# Patient Record
Sex: Male | Born: 1950 | Race: White | Hispanic: No | Smoking: Never smoker
Health system: Southern US, Community
[De-identification: ages and names within clinical notes are randomized; demographics above are authoritative.]

## PROBLEM LIST (undated history)

## (undated) DIAGNOSIS — I255 Ischemic cardiomyopathy: Secondary | ICD-10-CM

## (undated) DIAGNOSIS — R55 Syncope and collapse: Secondary | ICD-10-CM

## (undated) DIAGNOSIS — Z87442 Personal history of urinary calculi: Secondary | ICD-10-CM

## (undated) DIAGNOSIS — I2510 Atherosclerotic heart disease of native coronary artery without angina pectoris: Secondary | ICD-10-CM

## (undated) DIAGNOSIS — E119 Type 2 diabetes mellitus without complications: Secondary | ICD-10-CM

## (undated) DIAGNOSIS — N182 Chronic kidney disease, stage 2 (mild): Secondary | ICD-10-CM

## (undated) DIAGNOSIS — I10 Essential (primary) hypertension: Secondary | ICD-10-CM

## (undated) DIAGNOSIS — J189 Pneumonia, unspecified organism: Secondary | ICD-10-CM

## (undated) DIAGNOSIS — R001 Bradycardia, unspecified: Secondary | ICD-10-CM

## (undated) DIAGNOSIS — K219 Gastro-esophageal reflux disease without esophagitis: Secondary | ICD-10-CM

## (undated) HISTORY — DX: Essential (primary) hypertension: I10

## (undated) HISTORY — PX: SHOULDER SURGERY: SHX246

## (undated) HISTORY — PX: NOSE SURGERY: SHX723

## (undated) HISTORY — DX: Type 2 diabetes mellitus without complications: E11.9

## (undated) HISTORY — PX: CATARACT EXTRACTION: SUR2

## (undated) HISTORY — DX: Atherosclerotic heart disease of native coronary artery without angina pectoris: I25.10

## (undated) HISTORY — DX: Gastro-esophageal reflux disease without esophagitis: K21.9

## (undated) HISTORY — DX: Ischemic cardiomyopathy: I25.5

## (undated) HISTORY — DX: Syncope and collapse: R55

## (undated) HISTORY — PX: WRIST FRACTURE SURGERY: SHX121

## (undated) HISTORY — PX: ELBOW SURGERY: SHX618

---

## 2001-07-17 DIAGNOSIS — I219 Acute myocardial infarction, unspecified: Secondary | ICD-10-CM

## 2001-07-17 HISTORY — DX: Acute myocardial infarction, unspecified: I21.9

## 2005-01-05 ENCOUNTER — Encounter: Payer: Self-pay | Admitting: Cardiology

## 2005-01-06 ENCOUNTER — Encounter: Payer: Self-pay | Admitting: Cardiology

## 2007-03-19 ENCOUNTER — Encounter: Payer: Self-pay | Admitting: Cardiology

## 2009-11-27 ENCOUNTER — Encounter: Payer: Self-pay | Admitting: Cardiology

## 2010-01-12 ENCOUNTER — Ambulatory Visit: Payer: Self-pay | Admitting: Cardiology

## 2010-01-12 DIAGNOSIS — I10 Essential (primary) hypertension: Secondary | ICD-10-CM

## 2010-01-12 DIAGNOSIS — R079 Chest pain, unspecified: Secondary | ICD-10-CM

## 2010-01-12 DIAGNOSIS — I252 Old myocardial infarction: Secondary | ICD-10-CM | POA: Insufficient documentation

## 2010-01-12 DIAGNOSIS — K219 Gastro-esophageal reflux disease without esophagitis: Secondary | ICD-10-CM

## 2010-01-12 DIAGNOSIS — E119 Type 2 diabetes mellitus without complications: Secondary | ICD-10-CM

## 2010-01-19 ENCOUNTER — Encounter: Payer: Self-pay | Admitting: Physician Assistant

## 2010-01-19 ENCOUNTER — Ambulatory Visit: Payer: Self-pay | Admitting: Cardiology

## 2010-02-01 ENCOUNTER — Ambulatory Visit: Payer: Self-pay | Admitting: Cardiology

## 2010-02-01 ENCOUNTER — Encounter (INDEPENDENT_AMBULATORY_CARE_PROVIDER_SITE_OTHER): Payer: Self-pay | Admitting: *Deleted

## 2010-02-02 ENCOUNTER — Encounter: Payer: Self-pay | Admitting: Cardiology

## 2010-02-03 ENCOUNTER — Ambulatory Visit: Payer: Self-pay | Admitting: Cardiovascular Disease

## 2010-02-03 ENCOUNTER — Encounter: Payer: Self-pay | Admitting: Cardiology

## 2010-02-03 ENCOUNTER — Observation Stay (HOSPITAL_COMMUNITY): Admission: AD | Admit: 2010-02-03 | Discharge: 2010-02-04 | Payer: Self-pay | Admitting: Cardiovascular Disease

## 2010-02-03 ENCOUNTER — Ambulatory Visit: Payer: Self-pay | Admitting: Cardiology

## 2010-02-03 ENCOUNTER — Inpatient Hospital Stay (HOSPITAL_BASED_OUTPATIENT_CLINIC_OR_DEPARTMENT_OTHER): Admission: RE | Admit: 2010-02-03 | Discharge: 2010-02-03 | Payer: Self-pay | Admitting: Cardiovascular Disease

## 2010-02-04 ENCOUNTER — Encounter (INDEPENDENT_AMBULATORY_CARE_PROVIDER_SITE_OTHER): Payer: Self-pay | Admitting: *Deleted

## 2010-02-04 ENCOUNTER — Encounter: Payer: Self-pay | Admitting: Cardiology

## 2010-02-04 ENCOUNTER — Telehealth (INDEPENDENT_AMBULATORY_CARE_PROVIDER_SITE_OTHER): Payer: Self-pay | Admitting: *Deleted

## 2010-02-07 ENCOUNTER — Telehealth (INDEPENDENT_AMBULATORY_CARE_PROVIDER_SITE_OTHER): Payer: Self-pay | Admitting: *Deleted

## 2010-02-08 ENCOUNTER — Encounter (INDEPENDENT_AMBULATORY_CARE_PROVIDER_SITE_OTHER): Payer: Self-pay | Admitting: *Deleted

## 2010-02-09 ENCOUNTER — Encounter: Payer: Self-pay | Admitting: Cardiology

## 2010-02-10 ENCOUNTER — Encounter: Payer: Self-pay | Admitting: Cardiology

## 2010-02-23 ENCOUNTER — Encounter: Payer: Self-pay | Admitting: Cardiology

## 2010-03-16 ENCOUNTER — Ambulatory Visit: Payer: Self-pay | Admitting: Cardiology

## 2010-08-16 ENCOUNTER — Encounter: Payer: Self-pay | Admitting: Cardiology

## 2010-08-16 NOTE — Assessment & Plan Note (Signed)
Summary: f/u on stress echo  --agh   Visit Type:  Follow-up Primary Provider:  Johny Drilling Park,MD   History of Present Illness: the patient is a 60 year old male with a history of coronary artery disease documented in my prior note. The patient had a prior intervention at [facility]. He had a repeat catheterization in [place] 5 years ago with patent stents.on the last office visit he reports increased shortness of breath and chest pain. We prescribed indoor and ordered an echo and a stress echocardiogram. Unfortunately stress echocardiogram was nondiagnostic and by echo his ejection fraction was 40% with anterior akinesis. The patient states that he had no improvement with nitroglycerin preparations. Had several more episodes of substernal chest pain each time left-sided quite severe in nature with radiation to the arm and not resolved by nitroglycerin. However, he feels it is under significant amount of stress because his wife died suddenly 10 days ago from a massive heart attack. She was a diabetic but had no prior heart disease.  Preventive Screening-Counseling & Management  Alcohol-Tobacco     Smoking Status: never  Current Medications (verified): 1)  Tamsulosin Hcl 0.4 Mg Caps (Tamsulosin Hcl) .... Take 1 Tablet By Mouth Once A Day 2)  Metoprolol Succinate 100 Mg Xr24h-Tab (Metoprolol Succinate) .... Take 1 Tablet By Mouth Once A Day 3)  Ramipril 5 Mg Caps (Ramipril) .... Take 1 Tablet By Mouth Once A Day 4)  Aspir-Trin 325 Mg Tbec (Aspirin) .... Take 1 Tablet By Mouth Once A Day 5)  Pravastatin Sodium 40 Mg Tabs (Pravastatin Sodium) .... Take 1 Tablet By Mouth Once A Day 6)  Omeprazole 20 Mg Cpdr (Omeprazole) .... Take 1 Tablet By Mouth Once A Day 7)  Duetact 30-2 Mg Tabs (Pioglitazone Hcl-Glimepiride) .... Take 1 Tablet By Mouth Once A Day 8)  Imdur 30 Mg Xr24h-Tab (Isosorbide Mononitrate) .... Take 1 Tablet By Mouth Once A Day 9)  Nitrostat 0.4 Mg Subl (Nitroglycerin) .... Dissolve One  Tablet Under Tongue For Severe Chest Pain As Needed Every 5 Minutes, Not To Exceed 3 in 15 Min Time Frame 10)  Plavix 75 Mg Tabs (Clopidogrel Bisulfate) .... Take 1 Tablet By Mouth Once A Day  Allergies: No Known Drug Allergies  Comments:  Nurse/Medical Assistant: The patient's medications were reviewed with the patient and were updated in the Medication List. Pt verbally confirmed medications.  Cyril Loosen, RN, BSN (February 01, 2010 1:02 PM)  Past History:  Past Medical History: Last updated: 01/23/10 MI approx. 8 yrs ago in [facility] [place] Diabetes Type 2 G E R D Hypertension  Past Surgical History: Last updated: 01/23/2010 Stents x 2 Rt. Shoulder Surgery left elbow surgery Tumors nose surgery  Family History: Last updated: 2010/01/23 Father:deceased age 43 stroke,MI Mother: living age 18 healthy Siblings:4 sisters HTN  Social History: Last updated: 02/01/2010 widowed Alcohol Use - no Drug Use - no  Risk Factors: Smoking Status: never (02/01/2010)  Social History: widowed Alcohol Use - no Drug Use - no  Review of Systems       The patient complains of chest pain, shortness of breath, and depression.  The patient denies fatigue, malaise, fever, weight gain/loss, vision loss, decreased hearing, hoarseness, palpitations, prolonged cough, wheezing, sleep apnea, coughing up blood, abdominal pain, blood in stool, nausea, vomiting, diarrhea, heartburn, incontinence, blood in urine, muscle weakness, joint pain, leg swelling, rash, skin lesions, headache, fainting, dizziness, anxiety, enlarged lymph nodes, easy bruising or bleeding, and environmental allergies.    Vital Signs:  Patient profile:  60 year old male Height:      76 inches Weight:      222 pounds BMI:     27.12 Pulse rate:   54 / minute BP sitting:   148 / 80  (left arm) Cuff size:   large  Vitals Entered By: Cyril Loosen, RN, BSN (February 01, 2010 1:00 PM)  Nutrition Counseling: Patient's BMI is  greater than 25 and therefore counseled on weight management options. Comments Follow up on stress echo results    Physical Exam  Additional Exam:  General: Well-developed, well-nourished in no distress head: Normocephalic and atraumatic eyes PERRLA/EOMI intact, conjunctiva and lids normal nose: No deformity or lesions mouth normal dentition, normal posterior pharynx neck: Supple, no JVD.  No masses, thyromegaly or abnormal cervical nodes lungs: Normal breath sounds bilaterally without wheezing.  Normal percussion heart: regular rate and rhythm with normal S1 and S2, no S3 or S4.  PMI is normal.  No pathological murmurs abdomen: Normal bowel sounds, abdomen is soft and nontender without masses, organomegaly or hernias noted.  No hepatosplenomegaly musculoskeletal: Back normal, normal gait muscle strength and tone normal pulsus: Pulse is normal in all 4 extremities Extremities: No peripheral pitting edema neurologic: Alert and oriented x 3 skin: Intact without lesions or rashes cervical nodes: No significant adenopathy psychologic: Normal affect    Impression & Recommendations:  Problem # 1:  CAD (ICD-414.00) the patient is a surprise and the wall myocardial infarction. He has had a prior interventional procedure in the setting of a myocardial infarction. A stress echocardiogram is nondiagnostic although is able to walk 9 minutes without any substernal chest pain. Dr. Myrtis Ser felt images were nondiagnostic his ejection fraction is 40%. However the patient has ongoing chest pain and shortness of breath without any improvement and nitrates. I recommended that we proceed with a diagnostic cardiac catheterization. I discussed the risk and benefits of the procedure and hopefully this can be done via radial approach.I also start patient on some samples of Plavix 75 mg p.o. q. daily. His updated medication list for this problem includes:    Metoprolol Succinate 100 Mg Xr24h-tab (Metoprolol  succinate) .Marland Kitchen... Take 1 tablet by mouth once a day    Ramipril 5 Mg Caps (Ramipril) .Marland Kitchen... Take 1 tablet by mouth once a day    Aspir-trin 325 Mg Tbec (Aspirin) .Marland Kitchen... Take 1 tablet by mouth once a day    Imdur 30 Mg Xr24h-tab (Isosorbide mononitrate) .Marland Kitchen... Take 1 tablet by mouth once a day    Nitrostat 0.4 Mg Subl (Nitroglycerin) .Marland Kitchen... Dissolve one tablet under tongue for severe chest pain as needed every 5 minutes, not to exceed 3 in 15 min time frame    Plavix 75 Mg Tabs (Clopidogrel bisulfate) .Marland Kitchen... Take 1 tablet by mouth once a day  Orders: T-Basic Metabolic Panel [id number]) T-CBC No Diff ([id number]) T-Protime, Auto ([id number]) T-PTT ([id number])  Problem # 2:  HYPERTENSION (ICD-401.9) Assessment: Comment Only  His updated medication list for this problem includes:    Metoprolol Succinate 100 Mg Xr24h-tab (Metoprolol succinate) .Marland Kitchen... Take 1 tablet by mouth once a day    Ramipril 5 Mg Caps (Ramipril) .Marland Kitchen... Take 1 tablet by mouth once a day    Aspir-trin 325 Mg Tbec (Aspirin) .Marland Kitchen... Take 1 tablet by mouth once a day  Orders: T-Basic Metabolic Panel [id number]) T-CBC No Diff ([id number]) T-Protime, Auto ([id number]) T-PTT ([id number])  Other Orders: T-Chest x-ray, 2 views ([id number]) Cardiac Catheterization (Cardiac Cath)  Patient Instructions: 1)  JV Cath -  radial 2)  Plavix 75mg  daily 3)  Follow up in  1 month Prescriptions: PLAVIX 75 MG TABS (CLOPIDOGREL BISULFATE) Take 1 tablet by mouth once a day  #30 x 6   Entered by:   Hoover Brunette, LPN   Authorized by:   Lewayne Bunting, MD, [facility]   Signed by:   Hoover Brunette, LPN on 16/04/9603   Method used:   [facility]  [address] [phone][facility]       [address]       [place], [place]  [address]       Ph: [phone]       Fax: [phone]   RxID:   [id number]   Handout requested.

## 2010-08-16 NOTE — Assessment & Plan Note (Signed)
Summary: NP-HISTORY OF MI & LEFT ARM PAIN   Visit Type:  Initial Consult Primary Provider:  chan Park,MD  CC:  chest pain.  History of Present Illness: the patient is a pleasant 60 year old male with history of coronary artery disease. The patient had a prior myocardial infarction at [facility]. He apparently had 2 stents placed which according to him were medicated stents. Also a catheterization 5 years ago for recurrent left arm pain in [place] and this was within normal limits. We do not have those records available.  The patient has multiple risk factors for coronary artery disease including hypertension dyslipidemia and diabetes mellitus.  Over the last several months the patient has noticed increased shortness of breath as well as some tightness in the left arm particularly in the lower aspect of the upper with radiation into the hand. Typically this gets worse with exertion. She denies however any resting substernal chest pain or dyspnea at rest. The patient states that his prior presentation myocardial infarction was also with left arm pain but no substernal chest pain. His EKG in the office shows an old anterior wall myocardial infarction with nonspecific ST-T wave changes.  He reports to me that his most recent total cholesterol is 134 mg percent. His hemoglobin has been running slightly high but overall his diabetes has been in reasonable control again according to the patient's history.  Preventive Screening-Counseling & Management  Alcohol-Tobacco     Smoking Status: never  Current Medications (verified): 1)  Tamsulosin Hcl 0.4 Mg Caps (Tamsulosin Hcl) .... Take 1 Tablet By Mouth Once A Day 2)  Metoprolol Succinate 100 Mg Xr24h-Tab (Metoprolol Succinate) .... Take 1 Tablet By Mouth Once A Day 3)  Ramipril 5 Mg Caps (Ramipril) .... Take 1 Tablet By Mouth Once A Day 4)  Aspir-Trin 325 Mg Tbec (Aspirin) .... Take 1 Tablet By Mouth Once A Day 5)   Pravastatin Sodium 40 Mg Tabs (Pravastatin Sodium) .... Take 1 Tablet By Mouth Once A Day 6)  Omeprazole 20 Mg Cpdr (Omeprazole) .... Take 1 Tablet By Mouth Once A Day 7)  Duetact 30-2 Mg Tabs (Pioglitazone Hcl-Glimepiride) .... Take 1 Tablet By Mouth Once A Day 8)  Imdur 30 Mg Xr24h-Tab (Isosorbide Mononitrate) .... Take 1 Tablet By Mouth Once A Day 9)  Nitrostat 0.4 Mg Subl (Nitroglycerin) .... Dissolve One Tablet Under Tongue For Severe Chest Pain As Needed Every 5 Minutes, Not To Exceed 3 in 15 Min Time Frame  Allergies (verified): No Known Drug Allergies  Comments:  Nurse/Medical Assistant: The patient's medication bottles and allergies were reviewed with the patient and were updated in the Medication and Allergy Lists.  Past History:  Past Medical History: Last updated: 02/03/10 MI approx. 8 yrs ago in [place] [place] Diabetes Type 2 G E R D Hypertension  Past Surgical History: Last updated: 02/03/10 Stents x 2 Rt. Shoulder Surgery left elbow surgery Tumors nose surgery  Family History: Last updated: 2010-02-03 Father:deceased age 61 stroke,MI Mother: living age 58 healthy Siblings:4 sisters HTN  Social History: Last updated: Feb 03, 2010 Married  Alcohol Use - no Drug Use - no  Risk Factors: Smoking Status: never (02/03/10)  Family History: Father:deceased age 26 stroke,MI Mother: living age 61 healthy Siblings:4 sisters HTN  Social History: Smoking Status:  never  Review of Systems       The patient complains of shortness of breath.  The patient denies fatigue, malaise, fever, weight gain/loss, vision loss, decreased hearing, hoarseness, chest pain, palpitations, prolonged  cough, wheezing, sleep apnea, coughing up blood, abdominal pain, blood in stool, nausea, vomiting, diarrhea, heartburn, incontinence, blood in urine, muscle weakness, joint pain, leg swelling, rash, skin lesions, headache, fainting, dizziness, depression, anxiety, enlarged lymph  nodes, easy bruising or bleeding, and environmental allergies.    Vital Signs:  Patient profile:   60 year old male Height:      76 inches Weight:      220 pounds BMI:     26.88 Pulse rate:   55 / minute BP sitting:   132 / 81  (left arm) Cuff size:   large  Vitals Entered By: Carlye Grippe (January 12, 2010 2:52 PM)  Nutrition Counseling: Patient's BMI is greater than 25 and therefore counseled on weight management options. CC: chest pain    Physical Exam  Additional Exam:  General: Well-developed, well-nourished in no distress head: Normocephalic and atraumatic eyes PERRLA/EOMI intact, conjunctiva and lids normal nose: No deformity or lesions mouth normal dentition, normal posterior pharynx neck: Supple, no JVD.  No masses, thyromegaly or abnormal cervical nodes lungs: Normal breath sounds bilaterally without wheezing.  Normal percussion heart: regular rate and rhythm with normal S1 and S2, no S3 or S4.  PMI is normal.  No pathological murmurs abdomen: Normal bowel sounds, abdomen is soft and nontender without masses, organomegaly or hernias noted.  No hepatosplenomegaly musculoskeletal: Back normal, normal gait muscle strength and tone normal pulsus: Pulse is normal in all 4 extremities Extremities: No peripheral pitting edema neurologic: Alert and oriented x 3 skin: Intact without lesions or rashes cervical nodes: No significant adenopathy psychologic: Normal affect    EKG  Procedure date:  01/12/2010  Findings:      Sinus bradycardia. Old anteroseptal infarct pattern. Nonspecific ST-T wave changes.  Impression & Recommendations:  Problem # 1:  CHEST PAIN (ICD-786.50) the patient doesn't have any chest pain but he does have left arm pain similar to his prior presentation of myocardial infarction. The patient's pattern does not appear to be particularly unstable. We will add Imdur to his medical regimen. I also given p.r.n. nitroglycerin. He reports that his  episodes do not last longer than 30 seconds approximately 1 minute. Therefore I decided to first proceed with a dobutamine echocardiographic study and a baseline echo prior to consideration of catheterization. It may be that the patient may respond to medical therapy. His updated medication list for this problem includes:    Metoprolol Succinate 100 Mg Xr24h-tab (Metoprolol succinate) .Marland Kitchen... Take 1 tablet by mouth once a day    Ramipril 5 Mg Caps (Ramipril) .Marland Kitchen... Take 1 tablet by mouth once a day    Aspir-trin 325 Mg Tbec (Aspirin) .Marland Kitchen... Take 1 tablet by mouth once a day    Imdur 30 Mg Xr24h-tab (Isosorbide mononitrate) .Marland Kitchen... Take 1 tablet by mouth once a day    Nitrostat 0.4 Mg Subl (Nitroglycerin) .Marland Kitchen... Dissolve one tablet under tongue for severe chest pain as needed every 5 minutes, not to exceed 3 in 15 min time frame  Orders: EKG w/ Interpretation (93000) 2-D Echocardiogram (2D Echo) Echo- Stress (Stress Echo)  Problem # 2:  HYPERTENSION (ICD-401.9) blood pressure is well controlled. I made no changes in his antihypertensive regimen. His updated medication list for this problem includes:    Metoprolol Succinate 100 Mg Xr24h-tab (Metoprolol succinate) .Marland Kitchen... Take 1 tablet by mouth once a day    Ramipril 5 Mg Caps (Ramipril) .Marland Kitchen... Take 1 tablet by mouth once a day    Aspir-trin 325 Mg  Tbec (Aspirin) .Marland Kitchen... Take 1 tablet by mouth once a day  Problem # 3:  DM (ICD-250.00) followup primary care physician. His updated medication list for this problem includes:    Ramipril 5 Mg Caps (Ramipril) .Marland Kitchen... Take 1 tablet by mouth once a day    Aspir-trin 325 Mg Tbec (Aspirin) .Marland Kitchen... Take 1 tablet by mouth once a day    Duetact 30-2 Mg Tabs (Pioglitazone hcl-glimepiride) .Marland Kitchen... Take 1 tablet by mouth once a day  Problem # 4:  OLD MYOCARDIAL INFARCTION (ICD-412) Assessment: Comment Only  His updated medication list for this problem includes:    Metoprolol Succinate 100 Mg Xr24h-tab (Metoprolol  succinate) .Marland Kitchen... Take 1 tablet by mouth once a day    Ramipril 5 Mg Caps (Ramipril) .Marland Kitchen... Take 1 tablet by mouth once a day    Aspir-trin 325 Mg Tbec (Aspirin) .Marland Kitchen... Take 1 tablet by mouth once a day    Imdur 30 Mg Xr24h-tab (Isosorbide mononitrate) .Marland Kitchen... Take 1 tablet by mouth once a day    Nitrostat 0.4 Mg Subl (Nitroglycerin) .Marland Kitchen... Dissolve one tablet under tongue for severe chest pain as needed every 5 minutes, not to exceed 3 in 15 min time frame  Patient Instructions: 1)  Baseline Echo 2)  Stress Echo - next week (in the pm if possible because of work) 3)  Imdur 30mg  daily 4)  Nitroglycerin as needed for severe chest pain  5)  Follow up in  3 months Prescriptions: NITROSTAT 0.4 MG SUBL (NITROGLYCERIN) dissolve one tablet under tongue for severe chest pain as needed every 5 minutes, not to exceed 3 in 15 min time frame  #25 x 3   Entered by:   Hoover Brunette, LPN   Authorized by:   Lewayne Bunting, MD, [facility]   Signed by:   Hoover Brunette, LPN on 04/54/0981   Method used:   [facility]        [facility]  [address] [phone][facility] (retail)       [address]       [place], [place]  [address]       Ph: [phone]       Fax: [phone]   RxID:   [id number] IMDUR 30 MG XR24H-TAB (ISOSORBIDE MONONITRATE) Take 1 tablet by mouth once a day  #30 x 6   Entered by:   Hoover Brunette, LPN   Authorized by:   Lewayne Bunting, MD, [facility]   Signed by:   Hoover Brunette, LPN on 40/34/7425   Method used:   Electron[facility]        [facility]  [address] [phone]* (retail)       [address]       [place], [place]  [address]       Ph: [phone]       Fax: [phone]   RxID:   [id number]   Handout requested.  I have personnaly reviewed all medications changes and approved new prescriptions and refills. Lewayne Bunting, MD, [facility]  January 12, 2010 3:27 PM

## 2010-08-16 NOTE — Letter (Signed)
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 [address]   [place], [place] [address]   Phone: [phone]  Fax: [phone]    02/08/2010  TO: [name] IT MAY CONCERN   RE: Steven Haas [address] [id number]   The above named individual is under my medical care and may return to work on:  Monday, February 21, 2010.  If you have any further questions or need additional information, please call.     Sincerely,    Hoover Brunette, LPN

## 2010-08-16 NOTE — Medication Information (Signed)
Summary: RX Folder/ [facility]PON[facility]  RX Folder/ [facility]   Imported By: Dorise Hiss 02/07/2010 14:51:33  _____________________________________________________________________  External Attachment:    Type:   Image     Comment:   External Document

## 2010-08-16 NOTE — Medication Information (Signed)
Summary: RX Folder/ FAXED SECOND REQUEST [facility]  RX Folder/ FAXED SECOND REQUEST [facility]   Imported By: Dorise Hiss 02/09/2010 14:32:17  _____________________________________________________________________  External Attachment:    Type:   Image     Comment:   External Document

## 2010-08-16 NOTE — Letter (Signed)
Summary: External Correspondence/ FAXED PRE-CATH ORDER  External Correspondence/ FAXED PRE-CATH ORDER   Imported By: Dorise Hiss 02/08/2010 12:16:55  _____________________________________________________________________  External Attachment:    Type:   Image     Comment:   External Document

## 2010-08-16 NOTE — Letter (Signed)
Summary: Cardiac Cath Instructions - [facility]  [facility] [address]   [place], [place] [address]   Phone: [phone]  Fax: [phone]     02/01/2010 MRN: [id number]  Steven Haas [address] [place], [place]  [address]  Dear [name],   You are scheduled for a Cardiac Catheterization on Thursday, July 21 with Dr. Sanjuana Kava at 12:30.  Please arrive to the 1st floor of the Heart and Vascular Center at [facility] at 11:30 am on the day of your procedure. Please do not arrive before 6:30 a.m. Call the Heart and Vascular Center at [phone] if you are unable to make your appointment. The Code to get into the parking garage under the building is 1000. Take the elevators to the 1st floor. You must have someone to drive you home. Someone must be with you for the first 24 hours after you arrive home. Please wear clothes that are easy to get on and off and wear slip-on shoes. Do not eat or drink after midnight except water with your medications that morning. Bring all your medications and current insurance cards with you.  _X__ DO NOT take these medications before your procedure: Duetact - morning of procedure.  _X__ Make sure you take your aspirin.  _X___ Bonita Quin may take ALL of your medications with water that morning except listed above.  ___ DO NOT take ANY medications before your procedure.  ___ Pre-med instructions:  ________________________________________________________________________  The usual length of stay after your procedure is 2 to 3 hours. This can vary.  If you have any questions, please call the office at the number listed above.  Hoover Brunette, LPN               Directions to the [facility]  Please Note : Park in [place] under the building not the parking deck.  From [employer]: Turn onto [address] Left onto [address] (1st stoplight) Right at the  brick entrance to the hospital ([address]) Bear to the right and you will see a blue sign "Heart and Vascular Center" Parking garage is a sharp right'to get through the gate out in the code _______. Once you park, take the elevator to the first floor. Please do not arrive before 0630am. The building will be dark before that time.   From [address] Turn onto [address] Turn left into the brick entrance to the hospital (Main circle drive) Bear to the right and you will see a blue sign "[facility] Center" Parking garage is a sharp right, to get thru the gate put in the code ____. Once you park, take the elevator to the first floor. Please do not arrive before 0630am. The building will be dark before that time

## 2010-08-16 NOTE — Progress Notes (Signed)
Summary: med interaction   Phone Note Other Incoming   Summary of Call: [facility] regarding pt. being Plavix and Omeprazole together.  Did you want to suggest different reflux med? Hoover Brunette, LPN  February 04, 2010 1:55 PM   Follow-up for Phone Call        There is no real interaction but OK to take Zantac 150 mg Po at bedtime.  Follow-up by: Lewayne Bunting, MD, [facility],  February 07, 2010 11:55 AM  Additional Follow-up for Phone Call Additional follow up Details #1::        Patient notified.   Will continue with the Omeprazole. Hoover Brunette, LPN  February 07, 2010 12:13 PM

## 2010-08-16 NOTE — Progress Notes (Signed)
Summary: when go back to work  Phone Note Call from Patient   Complaint: Abdominal Pain Summary of Call: Needs to know when he can drive and go back to work.  Was d/c on 7/22 after stent placement and not told at d/c when able to go back to work.  Read d/c note, but was not clear to me as to when he could go back.  States he feels good, just a little weak.  States he feels like he could go back first of next week if okay by MD.   Hoover Brunette, LPN  February 07, 2010 12:17 PM   Follow-up for Phone Call        Return to work in two weeks.  Follow-up by: Lewayne Bunting, MD, [facility],  February 07, 2010 2:36 PM  Additional Follow-up for Phone Call Additional follow up Details #1::        Patient notified.   Hoover Brunette, LPN  February 08, 2010 10:30 AM

## 2010-08-16 NOTE — Medication Information (Signed)
Summary: RX Folder/ PLAV[facility] MEETS CLINICAL CRITERIA [facility]  RX Folder/ [facility] MEETS CLINICAL CRITERIA [facility]   Imported By: Dorise Hiss 02/10/2010 13:34:47  _____________________________________________________________________  External Attachment:    Type:   Image     Comment:   External Document

## 2010-08-16 NOTE — Letter (Signed)
Summary: Internal Other/ PATIENT HISTORY FORM  Internal Other/ PATIENT HISTORY FORM   Imported By: Dorise Hiss 01/14/2010 16:09:13  _____________________________________________________________________  External Attachment:    Type:   Image     Comment:   External Document

## 2010-08-16 NOTE — Letter (Signed)
Summary: Engineer, materials at [facility]  [address]   [place], [place] [address]   Phone: [phone]  Fax: [phone]        February 04, 2010 MRN: [id number]   Steven Haas [address] [place], [place]  [address]   Dear [name],  Your test ordered by Selena Batten has been reviewed by your physician (or physician assistant) and was found to be normal or stable. Your physician (or physician assistant) felt no changes were needed at this time.  ____ Echocardiogram  ____ Cardiac Stress Test  __X__ Lab Work  ____ Peripheral vascular study of arms, legs or neck  __X__ CT scan or X-ray  ____ Lung or Breathing test  _X___ Other:  above test were pre-cath   Thank you.   Hoover Brunette, LPN    Duane Boston, M.D., F.A.C.C. Thressa Sheller, M.D., F.A.C.C. Oneal Grout, M.D., F.A.C.C. Cheree Ditto, M.D., F.A.C.C. Daiva Nakayama, M.D., F.A.C.C. Kenney Houseman, M.D., F.A.C.C. Jeanne Ivan, PA-C

## 2010-08-16 NOTE — Assessment & Plan Note (Signed)
Summary: f32m  --agh   Visit Type:  Follow-up Primary Provider:  Johny Drilling Park,MD   History of Present Illness: the patient is a decidual male with history of coronary artery disease and underwent recent drug-eluting stent placement to the LAD secondary to in-stent restenosis. The patient's chest pain is now improved. He has been placed on Plavix.P2 Y. 12 testing was done the catheterization laboratory and show Plavix responsiveness. 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 The patient is recently no significant amount of stress. However since Physician is at more energy. He denies any orthopnea PND. His ejection fraction is roughly 40%. He denies any palpitations presyncope or syncope.  Preventive Screening-Counseling & Management  Alcohol-Tobacco     Smoking Status: never  Current Medications (verified): 1)  Tamsulosin Hcl 0.4 Mg Caps (Tamsulosin Hcl) .... Take 1 Tablet By Mouth Once A Day 2)  Metoprolol Succinate 50 Mg Xr24h-Tab (Metoprolol Succinate) .... Take 1 Tablet By Mouth Once A Day 3)  Ramipril 5 Mg Caps (Ramipril) .... Take 1 Tablet By Mouth Once A Day 4)  Aspir-Trin 325 Mg Tbec (Aspirin) .... Take 1 Tablet By Mouth Once A Day 5)  Pravastatin Sodium 40 Mg Tabs (Pravastatin Sodium) .... Take 1 Tablet By Mouth Once A Day 6)  Omeprazole 20 Mg Cpdr (Omeprazole) .... Take 1 Tablet By Mouth Once A Day 7)  Duetact 30-2 Mg Tabs (Pioglitazone Hcl-Glimepiride) .... Take 1 Tablet By Mouth Once A Day 8)  Imdur 30 Mg Xr24h-Tab (Isosorbide Mononitrate) .... Take 1 Tablet By Mouth Once A Day 9)  Nitrostat 0.4 Mg Subl (Nitroglycerin) .... Dissolve One Tablet Under Tongue For Severe Chest Pain As Needed Every 5 Minutes, Not To Exceed 3 in 15 Min Time Frame 10)  Plavix 75 Mg Tabs (Clopidogrel Bisulfate) .... Take 1 Tablet By Mouth Once A Day  Allergies (verified): No Known Drug Allergies  Comments:  Nurse/Medical Assistant: The  patient's medication bottles and allergies were reviewed with the patient and were updated in the Medication and Allergy Lists.  Past History:  Past Medical History: Last updated: 29-Jan-2010 MI approx. 8 yrs ago in [place] Diabetes Type 2 G E R D Hypertension  Past Surgical History: Last updated: 01/29/2010 Stents x 2 Rt. Shoulder Surgery left elbow surgery Tumors nose surgery  Family History: Last updated: January 29, 2010 Father:deceased age 48 stroke,MI Mother: living age 67 healthy Siblings:4 sisters HTN  Social History: Last updated: 02/01/2010 widowed Alcohol Use - no Drug Use - no  Risk Factors: Smoking Status: never (03/16/2010)  Review of Systems       The patient complains of anxiety.  The patient denies fatigue, malaise, fever, weight gain/loss, vision loss, decreased hearing, hoarseness, chest pain, palpitations, shortness of breath, prolonged cough, wheezing, sleep apnea, coughing up blood, abdominal pain, blood in stool, nausea, vomiting, diarrhea, heartburn, incontinence, blood in urine, muscle weakness, joint pain, leg swelling, rash, skin lesions, headache, fainting, dizziness, depression, enlarged lymph nodes, easy bruising or bleeding, and environmental allergies.    Vital Signs:  Patient profile:   60 year old male Height:      76 inches Weight:      221 pounds Pulse rate:   56 / minute BP sitting:   147 / 80  (left arm) Cuff size:   large  Vitals Entered By: Carlye Grippe (March 16, 2010 1:08 PM)  Physical Exam  Additional Exam:  General: Well-developed, well-nourished in no distress head: Normocephalic and atraumatic  eyes PERRLA/EOMI intact, conjunctiva and lids normal nose: No deformity or lesions mouth normal dentition, normal posterior pharynx neck: Supple, no JVD.  No masses, thyromegaly or abnormal cervical nodes lungs: Normal breath sounds bilaterally without wheezing.  Normal percussion heart: regular rate and rhythm with normal  S1 and S2, no S3 or S4.  PMI is normal.  No pathological murmurs abdomen: Normal bowel sounds, abdomen is soft and nontender without masses, organomegaly or hernias noted.  No hepatosplenomegaly musculoskeletal: Back normal, normal gait muscle strength and tone normal pulsus: Pulse is normal in all 4 extremities Extremities: No peripheral pitting edema neurologic: Alert and oriented x 3 skin: Intact without lesions or rashes cervical nodes: No significant adenopathy psychologic: Normal affect    Impression & Recommendations:  Problem # 1:  CAD (ICD-414.00) the patient status post instent restenosis requiring a drug-eluting stent. He will take indefinitely on aspirin and Plavix. His updated medication list for this problem includes:    Metoprolol Succinate 50 Mg Xr24h-tab (Metoprolol succinate) .Marland Kitchen... Take 1 tablet by mouth once a day    Ramipril 5 Mg Caps (Ramipril) .Marland Kitchen... Take 1 tablet by mouth once a day    Aspir-trin 325 Mg Tbec (Aspirin) .Marland Kitchen... Take 1 tablet by mouth once a day    Imdur 30 Mg Xr24h-tab (Isosorbide mononitrate) .Marland Kitchen... Take 1 tablet by mouth once a day    Nitrostat 0.4 Mg Subl (Nitroglycerin) .Marland Kitchen... Dissolve one tablet under tongue for severe chest pain as needed every 5 minutes, not to exceed 3 in 15 min time frame    Plavix 75 Mg Tabs (Clopidogrel bisulfate) .Marland Kitchen... Take 1 tablet by mouth once a day  Problem # 2:  HYPERTENSION (ICD-401.9) blood pressure is  well-controlled. Continue current medical therapy His updated medication list for this problem includes:    Metoprolol Succinate 50 Mg Xr24h-tab (Metoprolol succinate) .Marland Kitchen... Take 1 tablet by mouth once a day    Ramipril 5 Mg Caps (Ramipril) .Marland Kitchen... Take 1 tablet by mouth once a day    Aspir-trin 325 Mg Tbec (Aspirin) .Marland Kitchen... Take 1 tablet by mouth once a day  Problem # 3:  DM (ICD-250.00) Assessment: Comment Only  His updated medication list for this problem includes:    Ramipril 5 Mg Caps (Ramipril) .Marland Kitchen... Take 1  tablet by mouth once a day    Aspir-trin 325 Mg Tbec (Aspirin) .Marland Kitchen... Take 1 tablet by mouth once a day    Duetact 30-2 Mg Tabs (Pioglitazone hcl-glimepiride) .Marland Kitchen... Take 1 tablet by mouth once a day  Patient Instructions: 1)  Your physician recommends that you continue on your current medications as directed. Please refer to the Current Medication list given to you today. 2)  Follow up in  6 months Prescriptions: IMDUR 30 MG XR24H-TAB (ISOSORBIDE MONONITRATE) Take 1 tablet by mouth once a day  #90 x 3   Entered by:   Hoover Brunette, LPN   Authorized by:   Lewayne Bunting, MD, [facility]   Signed by:   Hoover Brunette, LPN on 59/56/3875   Method used:   Faxed to ...       [facility]       [address] [place], [place]  [address]       Ph: [phone]       Fax: [phone]   RxID:   [id number]

## 2010-08-16 NOTE — Letter (Signed)
Summary: Steven Haas -OFFICE NOTE  Steven Haas -OFFICE NOTE   Imported By: Claudette Laws 12/02/2009 13:56:08  _____________________________________________________________________  External Attachment:    Type:   Image     Comment:   External Document

## 2010-08-16 NOTE — Medication Information (Signed)
Summary: [facility]  [facility]   Imported By: Dorise Hiss 02/07/2010 14:57:06  _____________________________________________________________________  External Attachment:    Type:   Image     Comment:   External Document

## 2010-08-17 ENCOUNTER — Encounter: Payer: Self-pay | Admitting: Cardiology

## 2010-08-17 DIAGNOSIS — I2510 Atherosclerotic heart disease of native coronary artery without angina pectoris: Secondary | ICD-10-CM

## 2010-08-17 DIAGNOSIS — R072 Precordial pain: Secondary | ICD-10-CM

## 2010-08-18 ENCOUNTER — Encounter: Payer: Self-pay | Admitting: Cardiology

## 2010-08-18 DIAGNOSIS — R079 Chest pain, unspecified: Secondary | ICD-10-CM

## 2010-09-06 ENCOUNTER — Ambulatory Visit (INDEPENDENT_AMBULATORY_CARE_PROVIDER_SITE_OTHER): Payer: 59 | Admitting: Cardiology

## 2010-09-06 ENCOUNTER — Encounter: Payer: Self-pay | Admitting: Cardiology

## 2010-09-06 ENCOUNTER — Ambulatory Visit: Payer: Self-pay | Admitting: Cardiology

## 2010-09-06 DIAGNOSIS — I2510 Atherosclerotic heart disease of native coronary artery without angina pectoris: Secondary | ICD-10-CM

## 2010-09-06 DIAGNOSIS — I5022 Chronic systolic (congestive) heart failure: Secondary | ICD-10-CM

## 2010-09-13 NOTE — Letter (Signed)
Summary: Discharge [facility]  Discharge [facility]   Imported By: Dorise Hiss 09/06/2010 11:40:08  _____________________________________________________________________  External Attachment:    Type:   Image     Comment:   External Document

## 2010-09-13 NOTE — Consult Note (Signed)
Summary: Cardiology [facility]  Cardiology Consult-[facility]   Imported By: Cyril Loosen, RN, BSN 09/06/2010 10:58:00  _____________________________________________________________________  External Attachment:    Type:   Image     Comment:   External Document

## 2010-09-13 NOTE — Medication Information (Signed)
Summary: [facility] D/C MEDICATION SHEET ORDER  [facility] D/C MEDICATION SHEET ORDER   Imported By: Zachary George 09/06/2010 10:57:32  _____________________________________________________________________  External Attachment:    Type:   Image     Comment:   External Document

## 2010-10-01 LAB — GLUCOSE, CAPILLARY
Glucose-Capillary: 121 mg/dL — ABNORMAL HIGH (ref 70–99)
Glucose-Capillary: 94 mg/dL (ref 70–99)

## 2010-10-01 LAB — PLATELET INHIBITION P2Y12
Platelet Function  P2Y12: 225 [PRU] (ref 194–418)
Platelet Function Baseline: 321 [PRU] (ref 194–418)

## 2010-10-01 LAB — BASIC METABOLIC PANEL
Chloride: 103 mEq/L (ref 96–112)
GFR calc Af Amer: >60 mL/min (ref >60)
GFR calc non Af Amer: >60 mL/min (ref >60)
Glucose, Bld: 152 mg/dL — ABNORMAL HIGH (ref 70–99)
Sodium: 137 mEq/L (ref 135–145)

## 2010-10-01 LAB — POCT I-STAT GLUCOSE
Glucose, Bld: 117 mg/dL — ABNORMAL HIGH (ref 70–99)
Operator id: 221371

## 2010-10-01 LAB — CBC
HCT: 42.7 % (ref 39.0–52.0)
Platelets: 146 10*3/uL — ABNORMAL LOW (ref 150–400)
RDW: 13.3 % (ref 11.5–15.5)
WBC: 6 10*3/uL (ref 4.0–10.5)

## 2010-10-04 NOTE — Assessment & Plan Note (Signed)
Summary: 6 month fu recv reminder vts [facility] dsch 2/2 -srs   Visit Type:  Follow-up Primary Provider:  Johny Drilling Park,MD   History of Present Illness: The patient was recently hospitalized.  He presented with substernal chest pain.  He ruled out by enzymes.  The patient is status post drug-eluting stent to the LAD in July of 2011 secondary to in-stent restenosis..  Ejection fraction is 50%.  He has chronic sinus bradycardia.  His diabetes mellitus and hypertension. Prior to hospital discharge the patient had a Cardiolite study done which showed a large anteroapical scar with ejection fraction of 40% but no definite ischemia.  The patient had no EKG changes during stress testing. Remote anterior wall MI with stent of the LAD in [place], drug-eluting stent to the LAD secondary to severe in-stent restenosis July 2001.  Prior echocardiogram anteroapical hypokinesis ejection fraction 40% by echo and 50% by catheterization. The patient has done well after his recent hospitalization.  He reports no recurrent chest pain shortness of breath orthopnea PND he has no exertional symptoms.  He also has no heart failure symptoms.   Preventive Screening-Counseling & Management  Alcohol-Tobacco     Smoking Status: never  Current Medications (verified): 1)  Tamsulosin Hcl 0.4 Mg Caps (Tamsulosin Hcl) .... Take 1 Tablet By Mouth Once A Day 2)  Metoprolol Succinate 50 Mg Xr24h-Tab (Metoprolol Succinate) .... Take 1 Tablet By Mouth Once A Day 3)  Ramipril 5 Mg Caps (Ramipril) .... Take 1 Tablet By Mouth Once A Day 4)  Aspir-Trin 325 Mg Tbec (Aspirin) .... Take 1 Tablet By Mouth Once A Day 5)  Pravastatin Sodium 40 Mg Tabs (Pravastatin Sodium) .... Take 1 Tablet By Mouth Once A Day 6)  Omeprazole 20 Mg Cpdr (Omeprazole) .... Take 1 Tablet By Mouth Once A Day 7)  Duetact 30-2 Mg Tabs (Pioglitazone Hcl-Glimepiride) .... Take 1 Tablet By Mouth Once A Day 8)  Imdur 30 Mg Xr24h-Tab (Isosorbide Mononitrate) .... Take 1  Tablet By Mouth Once A Day 9)  Nitrostat 0.4 Mg Subl (Nitroglycerin) .... Dissolve One Tablet Under Tongue For Severe Chest Pain As Needed Every 5 Minutes, Not To Exceed 3 in 15 Min Time Frame 10)  Plavix 75 Mg Tabs (Clopidogrel Bisulfate) .... Take 1 Tablet By Mouth Once A Day  Allergies (verified): No Known Drug Allergies  Comments:  Nurse/Medical Assistant: The patient's medications and allergies were reviewed with the patient and were updated in the Medication and Allergy Lists. Tammi Romine CMA (September 06, 2010 12:57 PM)  Past History:  Past Medical History: Last updated: 01/27/10 MI approx. 8 yrs ago in [place] Diabetes Type 2 G E R D Hypertension  Past Surgical History: Last updated: 2010-01-27 Stents x 2 Rt. Shoulder Surgery left elbow surgery Tumors nose surgery  Family History: Last updated: 2010/01/27 Father:deceased age 56 stroke,MI Mother: living age 23 healthy Siblings:4 sisters HTN  Social History: Last updated: 02/01/2010 widowed Alcohol Use - no Drug Use - no  Risk Factors: Smoking Status: never (09/06/2010)  Vital Signs:  Patient profile:   60 year old male Height:      76 inches Weight:      232 pounds BMI:     28.34 O2 Sat:      96 % on Room air Pulse rate:   54 / minute BP sitting:   147 / 84  (left arm) Cuff size:   large  Vitals Entered By: Fuller Plan CMA (September 06, 2010 12:58 PM)  O2 Flow:  Room air  Physical Exam  Additional Exam:  General: Well-developed, well-nourished in no distress head: Normocephalic and atraumatic eyes PERRLA/EOMI intact, conjunctiva and lids normal nose: No deformity or lesions mouth normal dentition, normal posterior pharynx neck: Supple, no JVD.  No masses, thyromegaly or abnormal cervical nodes lungs: Normal breath sounds bilaterally without wheezing.  Normal percussion heart: regular rate and rhythm with normal S1 and S2, no S3 or S4.  PMI is normal.  No pathological  murmurs abdomen: Normal bowel sounds, abdomen is soft and nontender without masses, organomegaly or hernias noted.  No hepatosplenomegaly musculoskeletal: Back normal, normal gait muscle strength and tone normal pulsus: Pulse is normal in all 4 extremities Extremities: No peripheral pitting edema neurologic: Alert and oriented x 3 skin: Intact without lesions or rashes cervical nodes: No significant adenopathy psychologic: Normal affect    Impression & Recommendations:  Problem # 1:  CAD (ICD-414.00) coronary artery disease increase isosorbide mononitrate to 60 mg p.o. daily  substernal chest pain LV dysfunction PTCA  His updated medication list for this problem includes:    Metoprolol Succinate 50 Mg Xr24h-tab (Metoprolol succinate) .Marland Kitchen... Take 1 tablet by mouth once a day    Ramipril 10 Mg Caps (Ramipril) .Marland Kitchen... Take one capsule by mouth daily    Aspir-trin 325 Mg Tbec (Aspirin) .Marland Kitchen... Take 1 tablet by mouth once a day    Isosorbide Mononitrate Cr 60 Mg Xr24h-tab (Isosorbide mononitrate) .Marland Kitchen... Take one tablet by mouth daily    Nitrostat 0.4 Mg Subl (Nitroglycerin) .Marland Kitchen... Dissolve one tablet under tongue for severe chest pain as needed every 5 minutes, not to exceed 3 in 15 min time frame    Plavix 75 Mg Tabs (Clopidogrel bisulfate) .Marland Kitchen... Take 1 tablet by mouth once a day  Problem # 2:  HYPERTENSION (ICD-401.9) hypertension increase ramipril 10 mg p.o. daily His updated medication list for this problem includes:    Metoprolol Succinate 50 Mg Xr24h-tab (Metoprolol succinate) .Marland Kitchen... Take 1 tablet by mouth once a day    Ramipril 10 Mg Caps (Ramipril) .Marland Kitchen... Take one capsule by mouth daily    Aspir-trin 325 Mg Tbec (Aspirin) .Marland Kitchen... Take 1 tablet by mouth once a day  Problem # 3:  DM (ICD-250.00) Assessment: Comment Only  His updated medication list for this problem includes:    Ramipril 10 Mg Caps (Ramipril) .Marland Kitchen... Take one capsule by mouth daily    Aspir-trin 325 Mg Tbec (Aspirin) .Marland Kitchen...  Take 1 tablet by mouth once a day    Duetact 30-2 Mg Tabs (Pioglitazone hcl-glimepiride) .Marland Kitchen... Take 1 tablet by mouth once a day  Patient Instructions: 1)  Your physician wants you to follow-up in: 6 months. You will receive a reminder letter in the mail one-two months in advance. If you don't receive a letter, please call our office to schedule the follow-up appointment. 2)  Increase Altace (ramipril) to 10mg  by mouth once daily. 3)  Increase Imdur (isosorbide) to 60mg  by mouth once daily. Prescriptions: ISOSORBIDE MONONITRATE CR 60 MG XR24H-TAB (ISOSORBIDE MONONITRATE) Take one tablet by mouth daily  #30 x 6   Entered by:   Cyril Loosen, RN, BSN   Authorized by:   Lewayne Bunting, MD, [facility]   Signed by:   Cyril Loosen, RN, BSN on 09/06/2010   Method used:   [facility]  [address] [phone][facility] (retail)       [address]       [place], [place]  [address]  Ph: [phone]       Fax: [phone]   RxID:   [id number] RAMIPRIL 10 MG CAPS (RAMIPRIL) Take one capsule by mouth daily  #30 x 6   Entered by:   Cyril Loosen, RN, BSN   Authorized by:   Lewayne Bunting, MD, [facility]   Signed by:   Cyril Loosen, RN, BSN on 09/06/2010   Method used:   [facility]        [facility]  [address] [phone]* (retail)       [address]       [place], [place]  [address]       Ph: [phone]       Fax: [phone]   RxID:   [id number]

## 2010-11-08 ENCOUNTER — Other Ambulatory Visit: Payer: Self-pay | Admitting: *Deleted

## 2010-11-08 MED ORDER — RAMIPRIL 10 MG PO CAPS: 10.0000 mg | ORAL_CAPSULE | Freq: Every day | ORAL | Status: DC

## 2011-01-19 ENCOUNTER — Encounter: Payer: Self-pay | Admitting: Cardiology

## 2011-03-10 ENCOUNTER — Ambulatory Visit (INDEPENDENT_AMBULATORY_CARE_PROVIDER_SITE_OTHER): Payer: 59 | Admitting: Cardiology

## 2011-03-10 VITALS — BP 126/69 | HR 56 | Ht 76.0 in | Wt 217.0 lb

## 2011-03-10 DIAGNOSIS — I2510 Atherosclerotic heart disease of native coronary artery without angina pectoris: Secondary | ICD-10-CM

## 2011-03-10 DIAGNOSIS — I10 Essential (primary) hypertension: Secondary | ICD-10-CM

## 2011-03-10 MED ORDER — FUROSEMIDE 20 MG PO TABS: 20.0000 mg | ORAL_TABLET | Freq: Every day | ORAL | Status: DC | PRN

## 2011-03-10 NOTE — Assessment & Plan Note (Signed)
The patient reports no angina. I do not think stress testing is indicated. He does report some symptoms that could be consistent with heart failure and I will give him a short course of diuretic treatment with Lasix 20 mg a day for 5 days he does have a reduced ejection fraction. I asked the patient to call back next week and see if he has improvement in his orthopnea PND. If so we can probably continue Lasix 20 mg milligrams every other day

## 2011-03-10 NOTE — Patient Instructions (Signed)
   Lasix 20mg  daily x 5 days only  Continue all other current medications. Your physician wants you to follow up in: 6 months.  You will receive a reminder letter in the mail one-two months in advance.  If you don't receive a letter, please call our office to schedule the follow up appointment

## 2011-03-10 NOTE — Progress Notes (Signed)
HPI The patient is a 60 year old male with history of coronary artery disease status post drug-eluting stent placement to the LAD secondary to in-stent restenosis. The patient is on dual antiplatelet therapy. He reports no recurrent chest pain or shortness of breath. He does report orthopnea and PND. His prior ejection fraction is approximately 40%. He denies any palpitations, presyncope or syncope. A bedside echocardiogram was performed and demonstrates an ejection fraction of 40-45% with apical akinesis with scar as well as anterolateral hypokinesis. There is no significant mitral regurgitation or aortic insufficiency.  No Known Allergies  Current Outpatient Prescriptions on File Prior to Visit  Medication Sig Dispense Refill  . clopidogrel (PLAVIX) 75 MG tablet Take 75 mg by mouth daily.        . isosorbide mononitrate (IMDUR) 60 MG 24 hr tablet Take 60 mg by mouth daily.        . metoprolol (TOPROL-XL) 50 MG 24 hr tablet Take 50 mg by mouth daily.        . nitroGLYCERIN (NITROSTAT) 0.4 MG SL tablet Place 0.4 mg under the tongue every 5 (five) minutes as needed. Not to exceed 3 in 15 minute time frame.       Marland Kitchen omeprazole (PRILOSEC) 20 MG capsule Take 20 mg by mouth daily.        . pioglitazone-glimepiride (DUETACT) 30-2 MG per tablet Take 1 tablet by mouth daily.        . pravastatin (PRAVACHOL) 40 MG tablet Take 40 mg by mouth daily.        . ramipril (ALTACE) 10 MG capsule Take 1 capsule (10 mg total) by mouth daily.  90 capsule  3  . Tamsulosin HCl (FLOMAX) 0.4 MG CAPS Take 0.4 mg by mouth daily.          Past Medical History  Diagnosis Date  . Myocardial infarction 2004    In [place]  . Diabetes mellitus     Type 2  . GERD (gastroesophageal reflux disease)   . Hypertension     Past Surgical History  Procedure Date  . Coronary stent placement     x2  . Shoulder surgery     Right  . Elbow surgery     Left  . Nose surgery     Tumors    Family History  Problem  Relation Age of Onset  . Heart attack Father   . Stroke Father   . Hypertension Sister   . Hypertension Sister   . Hypertension Sister   . Hypertension Sister     History   Social History  . Marital Status: Widowed    Spouse Name: N/A    Number of Children: N/A  . Years of Education: N/A   Occupational History  . Not on file.   Social History Main Topics  . Smoking status: Not on file  . Smokeless tobacco: Not on file  . Alcohol Use: No  . Drug Use: No  . Sexually Active:    Other Topics Concern  . Not on file   Social History Narrative  . No narrative on file   [id number] positives as outlined above. The remainder of the 18  point review of systems is negative  PHYSICAL EXAM BP 126/69  Pulse 56  Ht 6\' 4"  (1.93 m)  Wt 217 lb (98.431 kg)  BMI 26.41 kg/m2  General: Well-developed, well-nourished in no distress Head: Normocephalic and atraumatic Eyes:PERRLA/EOMI intact, conjunctiva and lids normal Ears: No deformity or lesions Mouth:normal dentition,  normal posterior pharynx Neck: Supple, no JVD.  No masses, thyromegaly or abnormal cervical nodes Lungs: Normal breath sounds bilaterally without wheezing.  Normal percussion Cardiac: regular rate and rhythm with normal S1 and S2, no S3 or S4.  PMI is normal.  No pathological murmurs Abdomen: Normal bowel sounds, abdomen is soft and nontender without masses, organomegaly or hernias noted.  No hepatosplenomegaly MSK: Back normal, normal gait muscle strength and tone normal Vascular: Pulse is normal in all 4 extremities Extremities: No peripheral pitting edema Neurologic: Alert and oriented x 3 Skin: Intact without lesions or rashes Lymphatics: No significant adenopathy Psychologic: Normal affect   ECG: Not available  ASSESSMENT AND PLAN

## 2011-03-10 NOTE — Assessment & Plan Note (Signed)
Blood pressure well controlled continue current medication 

## 2011-03-21 ENCOUNTER — Telehealth: Payer: Self-pay | Admitting: *Deleted

## 2011-03-21 DIAGNOSIS — I2510 Atherosclerotic heart disease of native coronary artery without angina pectoris: Secondary | ICD-10-CM

## 2011-03-21 DIAGNOSIS — I10 Essential (primary) hypertension: Secondary | ICD-10-CM

## 2011-03-21 NOTE — Telephone Encounter (Signed)
States he was told to call back & let us know if any improvement on Lasix.  Has taken x 5 days as prescribed.  States he does seem to be feeling better, more energy, & breathing better at night.

## 2011-03-22 NOTE — Telephone Encounter (Signed)
As outlined in my note if the patient has improved with Lasix, then I would continue Lasix 20 mg by mouth daily. I initially said every other day but I think he probably benefit more from daily therapy. Would check a BMET and a BNP level in approximately 2-3 weeks.

## 2011-03-23 MED ORDER — FUROSEMIDE 20 MG PO TABS: 20.0000 mg | ORAL_TABLET | Freq: Every day | ORAL | Status: DC

## 2011-03-23 NOTE — Telephone Encounter (Signed)
Patient notified and verbalized understanding.  Will continue his Lasix 20mg  daily.  Will go ahead & send order to [facility] for labs in 2-3 weeks.

## 2011-03-23 NOTE — Telephone Encounter (Signed)
Left message to return call 

## 2011-04-19 ENCOUNTER — Other Ambulatory Visit: Payer: Self-pay | Admitting: *Deleted

## 2011-04-19 MED ORDER — FUROSEMIDE 20 MG PO TABS: 20.0000 mg | ORAL_TABLET | Freq: Every day | ORAL | Status: DC

## 2011-05-29 ENCOUNTER — Other Ambulatory Visit: Payer: Self-pay | Admitting: *Deleted

## 2011-05-29 MED ORDER — ISOSORBIDE MONONITRATE ER 30 MG PO TB24: 30.0000 mg | ORAL_TABLET | Freq: Every day | ORAL | Status: DC

## 2011-06-08 ENCOUNTER — Inpatient Hospital Stay (HOSPITAL_COMMUNITY)
Admission: AD | Admit: 2011-06-08 | Discharge: 2011-06-12 | DRG: 287 | Disposition: A | Discharge status: Home / Self Care | Payer: 59 | Source: Other Acute Inpatient Hospital | Attending: Cardiology | Admitting: Cardiology

## 2011-06-08 ENCOUNTER — Other Ambulatory Visit: Payer: Self-pay

## 2011-06-08 ENCOUNTER — Encounter: Payer: Self-pay | Admitting: Cardiology

## 2011-06-08 DIAGNOSIS — R55 Syncope and collapse: Secondary | ICD-10-CM | POA: Diagnosis present

## 2011-06-08 DIAGNOSIS — N182 Chronic kidney disease, stage 2 (mild): Secondary | ICD-10-CM | POA: Diagnosis present

## 2011-06-08 DIAGNOSIS — R209 Unspecified disturbances of skin sensation: Secondary | ICD-10-CM | POA: Diagnosis present

## 2011-06-08 DIAGNOSIS — R001 Bradycardia, unspecified: Secondary | ICD-10-CM | POA: Diagnosis present

## 2011-06-08 DIAGNOSIS — N289 Disorder of kidney and ureter, unspecified: Secondary | ICD-10-CM | POA: Diagnosis present

## 2011-06-08 DIAGNOSIS — I498 Other specified cardiac arrhythmias: Principal | ICD-10-CM | POA: Diagnosis present

## 2011-06-08 DIAGNOSIS — I4729 Other ventricular tachycardia: Secondary | ICD-10-CM | POA: Diagnosis not present

## 2011-06-08 DIAGNOSIS — I200 Unstable angina: Secondary | ICD-10-CM

## 2011-06-08 DIAGNOSIS — I252 Old myocardial infarction: Secondary | ICD-10-CM

## 2011-06-08 DIAGNOSIS — Z7982 Long term (current) use of aspirin: Secondary | ICD-10-CM

## 2011-06-08 DIAGNOSIS — K219 Gastro-esophageal reflux disease without esophagitis: Secondary | ICD-10-CM | POA: Insufficient documentation

## 2011-06-08 DIAGNOSIS — I472 Ventricular tachycardia, unspecified: Secondary | ICD-10-CM | POA: Diagnosis not present

## 2011-06-08 DIAGNOSIS — I2510 Atherosclerotic heart disease of native coronary artery without angina pectoris: Secondary | ICD-10-CM | POA: Diagnosis present

## 2011-06-08 DIAGNOSIS — E119 Type 2 diabetes mellitus without complications: Secondary | ICD-10-CM | POA: Insufficient documentation

## 2011-06-08 DIAGNOSIS — I129 Hypertensive chronic kidney disease with stage 1 through stage 4 chronic kidney disease, or unspecified chronic kidney disease: Secondary | ICD-10-CM | POA: Diagnosis present

## 2011-06-08 DIAGNOSIS — I10 Essential (primary) hypertension: Secondary | ICD-10-CM | POA: Insufficient documentation

## 2011-06-08 DIAGNOSIS — I5020 Unspecified systolic (congestive) heart failure: Secondary | ICD-10-CM | POA: Diagnosis present

## 2011-06-08 DIAGNOSIS — R079 Chest pain, unspecified: Secondary | ICD-10-CM | POA: Insufficient documentation

## 2011-06-08 HISTORY — DX: Bradycardia, unspecified: R00.1

## 2011-06-08 LAB — PROTIME-INR
INR: 1.09 (ref 0.00–1.49)
Prothrombin Time: 14.3 seconds (ref 11.6–15.2)

## 2011-06-08 LAB — GLUCOSE, CAPILLARY: Glucose-Capillary: 143 mg/dL — ABNORMAL HIGH (ref 70–99)

## 2011-06-08 LAB — CARDIAC PANEL(CRET KIN+CKTOT+MB+TROPI)
CK, MB: 2.3 ng/mL (ref 0.3–4.0)
Relative Index: 2.3 (ref 0.0–2.5)
Troponin I: <0.3 ng/mL (ref <0.30)

## 2011-06-08 LAB — APTT: aPTT: 60 seconds — ABNORMAL HIGH (ref 24–37)

## 2011-06-08 MED ORDER — ACETAMINOPHEN 325 MG PO TABS: 650.0000 mg | ORAL_TABLET | ORAL | Status: DC | PRN

## 2011-06-08 MED ORDER — NITROGLYCERIN 0.4 MG SL SUBL: 0.4000 mg | SUBLINGUAL_TABLET | SUBLINGUAL | Status: DC | PRN

## 2011-06-08 MED ORDER — PIOGLITAZONE HCL-GLIMEPIRIDE 30-2 MG PO TABS: 1.0000 | ORAL_TABLET | Freq: Every day | ORAL | Status: DC

## 2011-06-08 MED ORDER — ONDANSETRON HCL 4 MG/2ML IJ SOLN: 4.0000 mg | Freq: Four times a day (QID) | INTRAMUSCULAR | Status: DC | PRN

## 2011-06-08 MED ORDER — SODIUM CHLORIDE 0.9 % IJ SOLN
3.0000 mL | INTRAMUSCULAR | Status: DC | PRN
  Administered 2011-06-09: 3 mL via INTRAVENOUS

## 2011-06-08 MED ORDER — HEPARIN BOLUS VIA INFUSION
4000.0000 [IU] | Freq: Once | INTRAVENOUS | Status: AC
  Administered 2011-06-08: 4000 [IU] via INTRAVENOUS
  Filled 2011-06-08 (×2): qty 4000

## 2011-06-08 MED ORDER — PIOGLITAZONE HCL 30 MG PO TABS
30.0000 mg | ORAL_TABLET | Freq: Every day | ORAL | Status: DC
  Administered 2011-06-10 – 2011-06-12 (×3): 30 mg via ORAL
  Filled 2011-06-08 (×5): qty 1

## 2011-06-08 MED ORDER — INSULIN ASPART 100 UNIT/ML ~~LOC~~ SOLN
0.0000 [IU] | Freq: Three times a day (TID) | SUBCUTANEOUS | Status: DC
  Filled 2011-06-08: qty 3

## 2011-06-08 MED ORDER — ASPIRIN 325 MG PO TABS: 325.0000 mg | ORAL_TABLET | Freq: Every day | ORAL | Status: DC

## 2011-06-08 MED ORDER — PANTOPRAZOLE SODIUM 40 MG PO TBEC
40.0000 mg | DELAYED_RELEASE_TABLET | Freq: Every day | ORAL | Status: DC
  Administered 2011-06-09 – 2011-06-12 (×4): 40 mg via ORAL
  Filled 2011-06-08 (×4): qty 1

## 2011-06-08 MED ORDER — GLIMEPIRIDE 2 MG PO TABS
2.0000 mg | ORAL_TABLET | Freq: Every day | ORAL | Status: DC
  Administered 2011-06-10 – 2011-06-12 (×3): 2 mg via ORAL
  Filled 2011-06-08 (×5): qty 1

## 2011-06-08 MED ORDER — ISOSORBIDE MONONITRATE ER 30 MG PO TB24
30.0000 mg | ORAL_TABLET | Freq: Every day | ORAL | Status: DC
  Administered 2011-06-09 – 2011-06-12 (×4): 30 mg via ORAL
  Filled 2011-06-08 (×5): qty 1

## 2011-06-08 MED ORDER — SIMVASTATIN 20 MG PO TABS
20.0000 mg | ORAL_TABLET | Freq: Every day | ORAL | Status: DC
  Administered 2011-06-09 – 2011-06-11 (×3): 20 mg via ORAL
  Filled 2011-06-08 (×4): qty 1

## 2011-06-08 MED ORDER — ZOLPIDEM TARTRATE 5 MG PO TABS: 10.0000 mg | ORAL_TABLET | Freq: Every evening | ORAL | Status: DC | PRN

## 2011-06-08 MED ORDER — METOPROLOL SUCCINATE ER 100 MG PO TB24
100.0000 mg | ORAL_TABLET | Freq: Every day | ORAL | Status: DC
  Filled 2011-06-08 (×2): qty 1

## 2011-06-08 MED ORDER — HEPARIN (PORCINE) IN NACL 100-0.45 UNIT/ML-% IJ SOLN
14.0000 [IU]/kg/h | INTRAMUSCULAR | Status: DC
  Administered 2011-06-08: 14 [IU]/kg/h via INTRAVENOUS
  Filled 2011-06-08 (×2): qty 250

## 2011-06-08 MED ORDER — TAMSULOSIN HCL 0.4 MG PO CAPS
0.4000 mg | ORAL_CAPSULE | Freq: Every day | ORAL | Status: DC
  Administered 2011-06-09 – 2011-06-12 (×4): 0.4 mg via ORAL
  Filled 2011-06-08 (×5): qty 1

## 2011-06-08 MED ORDER — SODIUM CHLORIDE 0.9 % IV SOLN: 250.0000 mL | INTRAVENOUS | Status: DC

## 2011-06-08 MED ORDER — SODIUM CHLORIDE 0.9 % IV SOLN
INTRAVENOUS | Status: DC
  Administered 2011-06-09: 04:00:00 via INTRAVENOUS

## 2011-06-08 MED ORDER — CLOPIDOGREL BISULFATE 75 MG PO TABS
75.0000 mg | ORAL_TABLET | Freq: Every day | ORAL | Status: DC
  Administered 2011-06-09 – 2011-06-12 (×4): 75 mg via ORAL
  Filled 2011-06-08 (×5): qty 1

## 2011-06-08 MED ORDER — ASPIRIN 81 MG PO CHEW
324.0000 mg | CHEWABLE_TABLET | ORAL | Status: AC
  Administered 2011-06-09: 324 mg via ORAL
  Administered 2011-06-10: 10:00:00 via ORAL
  Filled 2011-06-08: qty 4

## 2011-06-08 MED ORDER — ALPRAZOLAM 0.25 MG PO TABS: 0.2500 mg | ORAL_TABLET | Freq: Two times a day (BID) | ORAL | Status: DC | PRN

## 2011-06-08 MED ORDER — SODIUM CHLORIDE 0.9 % IJ SOLN
3.0000 mL | Freq: Two times a day (BID) | INTRAMUSCULAR | Status: DC
  Administered 2011-06-08 – 2011-06-11 (×7): 3 mL via INTRAVENOUS

## 2011-06-08 MED ORDER — RAMIPRIL 10 MG PO CAPS
10.0000 mg | ORAL_CAPSULE | Freq: Every day | ORAL | Status: DC
  Administered 2011-06-09 – 2011-06-12 (×4): 10 mg via ORAL
  Filled 2011-06-08 (×5): qty 1

## 2011-06-08 NOTE — Progress Notes (Signed)
ANTICOAGULATION CONSULT NOTE - Initial Consult  Pharmacy Consult for Heparin Indication: Unstable angina  No Known Allergies  Patient Measurements: Height: 6\' 4"  (193 cm) Weight: 210 lb 11.2 oz (95.573 kg) IBW/kg (Calculated) : 86.8    Vital Signs: Temp: 97.9 F (36.6 C) (11/22 1827) Temp src: Oral (11/22 1827) BP: 150/83 mmHg (11/22 1827) Pulse Rate: 53  (11/22 1827)  Labs: Estimated Creatinine Clearance: 81.4 ml/min (by C-G formula based on Cr of 1.23).  Medical History: Past Medical History  Diagnosis Date  . Diabetes mellitus     Type 2  . GERD (gastroesophageal reflux disease)   . Hypertension   . CAD (coronary artery disease)     Remote MI 2004 s/p LAD PCI in [place], s/p DES to mid LAD for ISR 01/2010  . LV dysfunction      40-45% with apical akinesis with scar as well as anterolateral hypokinesis by Dr. Margarita Mail note 02/2011,  was 50% at cath 01/2010  . Sinus bradycardia   . Renal insufficiency     Cr 1.35 as outpatient 03/2011    Medications:  Prescriptions prior to admission  Medication Sig Dispense Refill  . aspirin 325 MG tablet Take 325 mg by mouth daily.        . clopidogrel (PLAVIX) 75 MG tablet Take 75 mg by mouth daily.        . furosemide (LASIX) 20 MG tablet Take 1 tablet (20 mg total) by mouth daily.  30 tablet  6  . isosorbide mononitrate (IMDUR) 30 MG 24 hr tablet Take 1 tablet (30 mg total) by mouth daily.  90 tablet  3  . metoprolol (TOPROL-XL) 100 MG 24 hr tablet Take 100 mg by mouth daily.        Marland Kitchen omeprazole (PRILOSEC) 20 MG capsule Take 20 mg by mouth daily.       . pioglitazone-glimepiride (DUETACT) 30-2 MG per tablet Take 1 tablet by mouth daily.        . pravastatin (PRAVACHOL) 40 MG tablet Take 40 mg by mouth at bedtime.       . ramipril (ALTACE) 10 MG capsule Take 1 capsule (10 mg total) by mouth daily.  90 capsule  3  . Tamsulosin HCl (FLOMAX) 0.4 MG CAPS Take 0.4 mg by mouth daily.        . nitroGLYCERIN (NITROSTAT) 0.4 MG SL tablet  Place 0.4 mg under the tongue every 5 (five) minutes as needed. Not to exceed 3 in 15 minute time frame. For chest pain.        Assessment: 59yom with stents to begin heparin for unstable angina.  Goal of Therapy:  Heparin level 0.3-0.7 units/ml   Plan:  1) Heparin bolus 4000 units x 1 2) Heparin drip at 1350 units/hr 3) 6 hour heparin level 4) Daily heparin level and CBC  Fredrik Rigger 06/08/2011,7:42 PM

## 2011-06-08 NOTE — H&P (Addendum)
History and Physical  Patient ID: AVAN GULLETT MRN: [id number], DOB/AGE: 58/10/1950 58 y.o. Date of Encounter: 06/08/2011  Primary Physician: [name], MD, MD Primary Cardiologist: Dr. Andee Lineman [facility])  Chief Complaint: left arm pain, dizziness, nausea Reason for Admission: unstable angina  HPI: 60 y/o M with hx CAD, LV dysfunction, DM presented to [facility] initially with complaints of dizziness, nausea, and left arm pain. This afternoon he spent about 30 minutes push-mowing a yard and about 10 minutes later while standing and talking to a friend, he felt his vision fade out and he became nauseous, dizzy, and diaphoretic. Afterwards he developed L arm pain/numbness. No chest pain, but he states in July 2011 when he required stenting to the LAD for ISR, he had very similar symptoms. However, he did not experience any of these while actually exerting himself. His friend is a Engineer, civil (consulting), so he had him sit down, applied O2 and gave him 2 baby aspirin then advised he go to the ER. At [facility], CE's were negative x 1 and EKG was without acute changes. He was still feeling dizzy at that time, but this has since resolved. He has had some DOE with stairs recently which is new (but similar to 01/2010 prior to requiring his stent). No current CP or left arm pain; he just feels "tired."  Past Medical History  Diagnosis Date  . Diabetes mellitus     Type 2  . GERD (gastroesophageal reflux disease)   . Hypertension   . CAD (coronary artery disease)     Remote MI 2004 s/p LAD PCI in [place], s/p DES to mid LAD for ISR 01/2010  . LV dysfunction      40-45% with apical akinesis with scar as well as anterolateral hypokinesis by Dr. Margarita Mail note 02/2011,  was 50% at cath 01/2010  . Sinus bradycardia   . Renal insufficiency     Cr 1.35 as outpatient 03/2011     Surgical History:  Past Surgical History  Procedure Date  . Coronary stent placement     x2  . Shoulder surgery     Right  .  Elbow surgery     Left  . Nose surgery     Tumors  . Coronary angioplasty      Home Meds: Medication Sig  aspirin 325 MG tablet Take 325 mg by mouth daily.    clopidogrel (PLAVIX) 75 MG tablet Take 75 mg by mouth daily.    furosemide (LASIX) 20 MG tablet Take 1 tablet (20 mg total) by mouth daily.  isosorbide mononitrate (IMDUR) 30 MG 24 hr tablet Take 1 tablet (30 mg total) by mouth daily.  metoprolol (TOPROL-XL) 100 MG 24 hr tablet Take 100 mg by mouth daily.    omeprazole (PRILOSEC) 20 MG capsule Take 20 mg by mouth daily.   pioglitazone-glimepiride (DUETACT) 30-2 MG per tablet Take 1 tablet by mouth daily.    pravastatin (PRAVACHOL) 40 MG tablet Take 40 mg by mouth at bedtime.   ramipril (ALTACE) 10 MG capsule Take 1 capsule (10 mg total) by mouth daily.  Tamsulosin HCl (FLOMAX) 0.4 MG CAPS Take 0.4 mg by mouth daily.    nitroGLYCERIN (NITROSTAT) 0.4 MG SL ablet Place 0.4 mg under the tongue every 5 (five) minutes as needed. Not to exceed 3 in 15 minute time frame. For chest pain.  Received ASA 162mg  / Zofran 4mg  / NTG paste at [facility].  Allergies: No Known Allergies  History   Social History  .  Marital Status: Widowed  Accompanied by 2 sons. Social History Main Topics  . Smoking status: Never Smoker   . Smokeless tobacco: Not on file  . Alcohol Use: No  . Drug Use: No    Family History  Problem Relation Age of Onset  . Heart attack Father   . Stroke Father   . Hypertension Sister   . Hypertension Sister   . Hypertension Sister   . Hypertension Sister     Review of Systems: General: negative for chills, fever, or weight changes. Positive for diaphoresis as above. Cardiovascular: Positive for CP as above. Some dyspnea on exertion recently. No edema, orthopnea, claudication, palpitations, paroxysmal nocturnal dyspnea Dermatological: negative for rash Respiratory: negative for cough or wheezing Urologic: negative for hematuria Abdominal:Positive for nausea. No  vomiting, diarrhea, bright red blood per rectum, melena, or hematemesis Neurologic: No actual syncope. Positive for dizziness All other systems reviewed and are otherwise negative except as noted above.  Labs: At [facility]: WBC 8.1, Hgb 15.8, Hct 46, Plt 182. INR 1.0. Glu 170. Ca 9.3. CE's negative x 1.  Na 136, K 3.9, Cl 106, Co2 25, BUN 25, Cr 1.40.  Radiology/Studies:  CXR at [facility] 06/08/11 - cardiomegaly without acute disease.   EKG: NSR with Q's V1-V3, TWI avL otherwise without acute changes - no significant change from prior.  Physical Exam: Blood pressure 150/83, pulse 53, temperature 97.9 F (36.6 C), temperature source Oral, resp. rate 20, height 6\' 4"  (1.93 m), weight 210 lb 11.2 oz (95.573 kg), SpO2 100.00%. General: Well developed, well nourished, in no acute distress. Head: Normocephalic, atraumatic, sclera non-icteric, no xanthomas, nares are without discharge.  Neck: Negative for carotid bruits. JVD not elevated. Lungs: Clear bilaterally to auscultation without wheezes, rales, or rhonchi. Breathing is unlabored. Heart: RRR with S1 S2. No murmurs, rubs, or gallops appreciated. Abdomen: Soft, non-tender, non-distended with normoactive bowel sounds. No hepatomegaly. No rebound/guarding. No obvious abdominal masses. Msk:  Strength and tone appear normal for age. Extremities: No clubbing or cyanosis. No edema. Right pedal pulses is 2+, left pedal pulse 1+. Neuro: Alert and oriented X 3. Moves all extremities spontaneously. Psych:  Responds to questions appropriately with a normal affect.    ASSESSMENT AND PLAN:  1. Left arm pain/nausea/dizziness with known CAD as outlined above - although he did not have any exertional symptoms, his recent DOE and analogy to prior angina is concerning for unstable angina. It is possible that in the setting of taking daily Lasix that he dropped his pressure after working in the yard leading to this chest pain, but he is convinced this is what  happened in July 2011 leading up to his stent placement. I will discuss with Dr. Ladona Ridgel. Will admit, heparinize, continue current meds with the exception of holding Lasix in preparation for cath.  2. DM - continue current meds, add SSI.  3. LV dysfunction - continue ACEI. Hold Lasix with #4.  4. Acute (on possible chronic) renal insufficiency - hold Lasix, follow Cr. If we proceed with cath in AM, would not do LV gram.   Signed, Dayna Dunn PA-C 06/08/2011, 6:36 PM  Cardiology Attending  The patient has been seen and examined by myself. I discussed the findings with PA Dayna Dunn. Briefly the patient is a 60 year old man with known coronary artery disease status post MI. He underwent coronary stenting initially in 2004. He developed in-stent restenosis in 2011. He was in his usual state of health until today. He had been working outside  mowing the grass. After he finished the patient developed numbness in his left arm associated with nausea dizziness and left arm pain. These symptoms were exactly like the symptoms that he had both in 2004 as well as 2011. He initially presented to the emergency room at [facility] and has been transferred here for additional evaluation and treatment. His past medical history, past surgical history, social history, family history, and review of systems have been accurately recorded. His physical exam has also been accurately recorded. His EKG demonstrates sinus rhythm with a prior anteroseptal myocardial infarction. There are no acute ST-T wave changes. Initial cardiac enzymes are unremarkable.  My impression is that the patient's symptoms represent his anginal equivalent. I discussed the treatment options with the patient. The risks, benefits, holes, and expectations of coronary artery catheterization have been discussed with the patient and he wishes to proceed. Because he has had increasing dyspnea over the last several weeks, I have also recommended right  heart catheterization.  11/23  See my progress note.  Films reviewed prior to cath procedure.    Shawnie Pons 06/09/2011 1:34 PM

## 2011-06-09 ENCOUNTER — Encounter (HOSPITAL_COMMUNITY)
Admission: AD | Disposition: A | Discharge status: Home / Self Care | Payer: Self-pay | Source: Other Acute Inpatient Hospital | Attending: Cardiology

## 2011-06-09 ENCOUNTER — Encounter (HOSPITAL_COMMUNITY): Payer: Self-pay | Admitting: Cardiology

## 2011-06-09 ENCOUNTER — Other Ambulatory Visit: Payer: Self-pay

## 2011-06-09 DIAGNOSIS — I2510 Atherosclerotic heart disease of native coronary artery without angina pectoris: Secondary | ICD-10-CM

## 2011-06-09 HISTORY — PX: LEFT AND RIGHT HEART CATHETERIZATION WITH CORONARY ANGIOGRAM: SHX5449

## 2011-06-09 LAB — BASIC METABOLIC PANEL
BUN: 26 mg/dL — ABNORMAL HIGH (ref 6–23)
CO2: 27 mEq/L (ref 19–32)
Chloride: 104 mEq/L (ref 96–112)
GFR calc Af Amer: 77 mL/min — ABNORMAL LOW (ref >90)
Glucose, Bld: 162 mg/dL — ABNORMAL HIGH (ref 70–99)
Potassium: 3.6 mEq/L (ref 3.5–5.1)

## 2011-06-09 LAB — CARDIAC PANEL(CRET KIN+CKTOT+MB+TROPI)
CK, MB: 2.1 ng/mL (ref 0.3–4.0)
Troponin I: <0.3 ng/mL (ref <0.30)

## 2011-06-09 LAB — POCT I-STAT 3, VENOUS BLOOD GAS (G3P V)
Acid-base deficit: 1 mmol/L (ref 0.0–2.0)
O2 Saturation: 64 %
TCO2: 26 mmol/L (ref 0–100)
pCO2, Ven: 45.3 mmHg (ref 45.0–50.0)
pH, Ven: 7.306 — ABNORMAL HIGH (ref 7.250–7.300)
pO2, Ven: 38 mmHg (ref 30.0–45.0)

## 2011-06-09 LAB — POCT I-STAT 3, ART BLOOD GAS (G3+)
Bicarbonate: 24.6 mEq/L — ABNORMAL HIGH (ref 20.0–24.0)
TCO2: 26 mmol/L (ref 0–100)
pCO2 arterial: 42.2 mmHg (ref 35.0–45.0)
pH, Arterial: 7.373 (ref 7.350–7.450)

## 2011-06-09 LAB — CBC
HCT: 38.8 % — ABNORMAL LOW (ref 39.0–52.0)
Hemoglobin: 13.9 g/dL (ref 13.0–17.0)
MCV: 84.7 fL (ref 78.0–100.0)
RBC: 4.58 MIL/uL (ref 4.22–5.81)
WBC: 7.5 10*3/uL (ref 4.0–10.5)

## 2011-06-09 LAB — GLUCOSE, CAPILLARY: Glucose-Capillary: 96 mg/dL (ref 70–99)

## 2011-06-09 LAB — LIPID PANEL: LDL Cholesterol: 93 mg/dL (ref 0–99)

## 2011-06-09 SURGERY — LEFT AND RIGHT HEART CATHETERIZATION WITH CORONARY ANGIOGRAM

## 2011-06-09 MED ORDER — FENTANYL CITRATE 0.05 MG/ML IJ SOLN
INTRAMUSCULAR | Status: AC
  Filled 2011-06-09: qty 2

## 2011-06-09 MED ORDER — HEPARIN (PORCINE) IN NACL 2-0.9 UNIT/ML-% IJ SOLN
INTRAMUSCULAR | Status: AC
  Filled 2011-06-09: qty 2000

## 2011-06-09 MED ORDER — SODIUM CHLORIDE 0.9 % IV SOLN
INTRAVENOUS | Status: AC
  Administered 2011-06-09: 15:00:00 via INTRAVENOUS

## 2011-06-09 MED ORDER — NITROGLYCERIN 0.2 MG/ML ON CALL CATH LAB
INTRAVENOUS | Status: AC
  Filled 2011-06-09: qty 1

## 2011-06-09 MED ORDER — HEPARIN (PORCINE) IN NACL 100-0.45 UNIT/ML-% IJ SOLN
1400.0000 [IU]/h | INTRAMUSCULAR | Status: DC
  Filled 2011-06-09: qty 250

## 2011-06-09 MED ORDER — MIDAZOLAM HCL 2 MG/2ML IJ SOLN
INTRAMUSCULAR | Status: AC
  Filled 2011-06-09: qty 2

## 2011-06-09 MED ORDER — ASPIRIN EC 325 MG PO TBEC
325.0000 mg | DELAYED_RELEASE_TABLET | Freq: Every day | ORAL | Status: DC
  Filled 2011-06-09 (×3): qty 1

## 2011-06-09 MED ORDER — LIDOCAINE HCL (PF) 1 % IJ SOLN
INTRAMUSCULAR | Status: AC
  Filled 2011-06-09: qty 30

## 2011-06-09 NOTE — Progress Notes (Signed)
ANTICOAGULATION CONSULT NOTE - Follow Up Consult  Pharmacy Consult for heparin Indication: USAP  No Known Allergies  Patient Measurements: Height: 6\' 4"  (193 cm) Weight: 210 lb 11.2 oz (95.573 kg) IBW/kg (Calculated) : 86.8   Vital Signs: Temp: 98 F (36.7 C) (11/23 0447) Temp src: Oral (11/23 0447) BP: 111/65 mmHg (11/23 0447) Pulse Rate: 50  (11/23 0447)  Labs:  Basename 06/09/11 0600 06/09/11 0023 06/09/11 0022 06/08/11 2016  HGB -- 13.9 -- --  HCT -- 38.8* -- --  PLT -- 145* -- --  APTT -- -- -- 60*  LABPROT -- -- -- 14.3  INR -- -- -- 1.09  HEPARINUNFRC 0.29* 0.45 -- --  CREATININE -- 1.18 -- --  CKTOTAL 78 -- 87 101  CKMB 2.1 -- 2.2 2.3  TROPONINI <0.30 -- <0.30 <0.30   Estimated Creatinine Clearance: 84.8 ml/min (by C-G formula based on Cr of 1.18).   Medications:  Scheduled:     . aspirin  324 mg Oral Pre-Cath  . aspirin EC  325 mg Oral Daily  . clopidogrel  75 mg Oral Daily  . glimepiride  2 mg Oral Q breakfast  . heparin  4,000 Units Intravenous Once  . insulin aspart  0-9 Units Subcutaneous TID WC  . isosorbide mononitrate  30 mg Oral Daily  . metoprolol  100 mg Oral Daily  . pantoprazole  40 mg Oral Q0600  . pioglitazone  30 mg Oral Daily  . ramipril  10 mg Oral Daily  . simvastatin  20 mg Oral q1800  . sodium chloride  3 mL Intravenous Q12H  . Tamsulosin HCl  0.4 mg Oral Daily  . DISCONTD: aspirin  325 mg Oral Daily  . DISCONTD: pioglitazone-glimepiride  1 tablet Oral Daily   Infusions:     . sodium chloride    . sodium chloride 75 mL/hr at 06/09/11 0404  . heparin    . DISCONTD: heparin 14 Units/kg/hr (06/08/11 2039)   Assessment: 60yo male admitted for ACS  on heparin 1350 uts/hr HL 0.29 < goal.  No bleeding noted, CBC stable  Goal of Therapy:  Heparin level 0.3-0.7 units/ml   Plan:  Will increase heparin drip 1400 uts/hr .  Marcelino Scot PharmD BCPS 06/09/2011,8:35 AM

## 2011-06-09 NOTE — Progress Notes (Signed)
ANTICOAGULATION CONSULT NOTE - Follow Up Consult  Pharmacy Consult for heparin Indication: USAP  No Known Allergies  Patient Measurements: Height: 6\' 4"  (193 cm) Weight: 210 lb 11.2 oz (95.573 kg) IBW/kg (Calculated) : 86.8   Vital Signs: Temp: 97.3 F (36.3 C) (11/22 2142) Temp src: Oral (11/22 2142) BP: 101/57 mmHg (11/22 2142) Pulse Rate: 55  (11/22 2142)  Labs:  Basename 06/09/11 0023 06/09/11 0022 06/08/11 2016  HGB 13.9 -- --  HCT 38.8* -- --  PLT 145* -- --  APTT -- -- 60*  LABPROT -- -- 14.3  INR -- -- 1.09  HEPARINUNFRC 0.45 -- --  CREATININE 1.18 -- --  CKTOTAL -- 87 101  CKMB -- 2.2 2.3  TROPONINI -- <0.30 <0.30   Estimated Creatinine Clearance: 84.8 ml/min (by C-G formula based on Cr of 1.18).   Medications:  Scheduled:    . aspirin  324 mg Oral Pre-Cath  . aspirin  325 mg Oral Daily  . clopidogrel  75 mg Oral Daily  . glimepiride  2 mg Oral Q breakfast  . heparin  4,000 Units Intravenous Once  . insulin aspart  0-9 Units Subcutaneous TID WC  . isosorbide mononitrate  30 mg Oral Daily  . metoprolol  100 mg Oral Daily  . pantoprazole  40 mg Oral Q0600  . pioglitazone  30 mg Oral Daily  . ramipril  10 mg Oral Daily  . simvastatin  20 mg Oral q1800  . sodium chloride  3 mL Intravenous Q12H  . Tamsulosin HCl  0.4 mg Oral Daily  . DISCONTD: pioglitazone-glimepiride  1 tablet Oral Daily   Infusions:    . sodium chloride    . sodium chloride    . heparin 14 Units/kg/hr (06/08/11 2039)   Assessment: 59yo male therapeutic on heparin with initial dosing for USAP.  Goal of Therapy:  Heparin level 0.3-0.7 units/ml   Plan:  Will continue at current rate and confirm stable with am labs.  Colleen Can PharmD BCPS 06/09/2011,1:44 AM

## 2011-06-09 NOTE — Op Note (Signed)
Cardiac Catheterization Procedure Note  Name: Steven Haas MRN: [id number] DOB: 1951-04-22  Procedure: Right Heart Cath, Left Heart Cath, Selective Coronary Angiography, LV angiography  Indication:    Procedural Details: The right groin was prepped, draped, and anesthetized with 1% lidocaine. Using the modified Seldinger technique a 5 French sheath was placed in the right femoral artery and a 7 French sheath was placed in the right femoral vein. A Swan-Ganz catheter was used for the right heart catheterization. Standard protocol was followed for recording of right heart pressures and sampling of oxygen saturations. Fick cardiac output was calculated. Standard Judkins catheters were used for selective coronary angiography and left ventriculography. There were no immediate procedural complications. The patient was transferred to the post catheterization recovery area for further monitoring.  Procedural Findings: Hemodynamics RA 6 RV 27/5 PA 25/8 (14) PCWP 9 LV 95/7 AO 90/[id number])  Oxygen saturations: PA 74% AO 97% SVC 76%  Cardiac Output (Fick) 7.6 l/m  Cardiac Index (Fick) 3.4l/m/M2   Coronary angiography: Coronary dominance: LCX  Left mainstem: Large caliber and free of significant disease  Left anterior descending (LAD): Calcified proximally followed by stent (with stent in a stent).  There is diffuse 30-40% narrowing within the stented segment.  The first diagonal has about 60% proximal narrowing and is at most moderate in size.  The distal LAD is diffusely plaqued.  There is one focal area of narrowing of 60% in the proximal portion of the distal vessel  Left circumflex (LCx): There is ramus vessel with mid 40% narrowing.  This has a fairly small distribution.  The CFX is a dominant vessel and provides several distal marginal/lateral branches.  The third OM has a 40% proximal stenosis just beyond a vessel bifurcation.   The PDA has mild irregularities  Right coronary artery  (RCA): Non dominant without significant disease  Left ventriculography: Not performed due to creatinine  Final Conclusions:  Continued patency of the LAD stent supplying the area of prior infarction.   Normal R and Left heart pressures. Recommendations: We will hold metoprolol and restart at lower dose.  It possibly could be related to bradycardia.     Shawnie Pons 06/09/2011, 2:40 PM

## 2011-06-09 NOTE — Progress Notes (Signed)
Pt. HR got down to 38, but went right back up to the mid 40's. Pt. Asymptomatic, MD aware, no orders given.

## 2011-06-09 NOTE — Progress Notes (Signed)
Pt. HR went down to 38, and went right back to the upper 40's and low 50's. Pt. Asymptomatic will continue to monitor.

## 2011-06-09 NOTE — Progress Notes (Signed)
Patient ID: RICHARD RITCHEY, male   DOB: 1950/08/14, 60 y.o.   MRN: [id number] Patient stable post cath.  Groin looks ok.  HR about 62 on telemetry.  Will continue to monitor, and hold beta blocker.  Will likely readjust doses of metoprolol---? If that led to hospitalization.    Shawnie Pons 06/09/2011 6:10 PM

## 2011-06-09 NOTE — Progress Notes (Signed)
Pt ambulated 150 ft post cath bedrest, no c/o of discomfort or SOB, cath groin site without swelling, bleeding or pain at this time. Will continue to monitor patient

## 2011-06-09 NOTE — Progress Notes (Signed)
Subjective:  Patient scheduled for cardiac cath.  I spoke with the patient, and he wishes to proceed.  Dr. Ladona Ridgel has previously seen yesterday, and recommended the study.  Cath reports reviewed in detail.  Stent placed last year for ISR.   Objective:  Vital Signs in the last 24 hours: Temp:  [97.3 F (36.3 C)-98 F (36.7 C)] 98 F (36.7 C) (11/23 0447) Pulse Rate:  [40-55] 40  (11/23 0939) Resp:  [18-20] 18  (11/23 0447) BP: (101-150)/(57-83) 116/68 mmHg (11/23 0939) SpO2:  [96 %-100 %] 100 % (11/23 0447) Weight:  [95.573 kg (210 lb 11.2 oz)] 210 lb 11.2 oz (95.573 kg) (11/22 1827)  Intake/Output from previous day: 11/22 0701 - 11/23 0700 In: -  Out: 325 [Urine:325]   Physical Exam: General: Well developed, well nourished, in no acute distress. Head:  Normocephalic and atraumatic. Lungs: Clear to auscultation and percussion. Heart: Normal S1 and S2.  No murmur, rubs or gallops.  Pulses: Pulses normal in all 4 extremities. Extremities: No clubbing or cyanosis. No edema. Neurologic: Alert and oriented x 3.    Lab Results:  [facility] 06/09/11 0023  WBC 7.5  HGB 13.9  PLT 145*    Basename 06/09/11 0023  NA 138  K 3.6  CL 104  CO2 27  GLUCOSE 162*  BUN 26*  CREATININE 1.18    Basename 06/09/11 0600 06/09/11 0022  TROPONINI <0.30 <0.30   Hepatic Function Panel No results found for this basename: PROT,ALBUMIN,AST,ALT,ALKPHOS,BILITOT,BILIDIR,IBILI in the last 72 hours  Basename 06/09/11 0600  CHOL 153   No results found for this basename: PROTIME in the last 72 hours  Imaging: No results found.    Assessment/Plan:  Patient Active Hospital Problem List: Unstable angina (06/08/2011)   Assessment: for cath study today.  Not clear if source of symptoms   Plan: Risks and benefits discussed        Shawnie Pons, MD, [facility], FSCAI 06/09/2011, 1:09 PM

## 2011-06-10 DIAGNOSIS — R001 Bradycardia, unspecified: Secondary | ICD-10-CM | POA: Diagnosis present

## 2011-06-10 LAB — GLUCOSE, CAPILLARY
Glucose-Capillary: 128 mg/dL — ABNORMAL HIGH (ref 70–99)
Glucose-Capillary: 140 mg/dL — ABNORMAL HIGH (ref 70–99)

## 2011-06-10 LAB — HEPARIN LEVEL (UNFRACTIONATED): Heparin Unfractionated: <0.1 IU/mL — ABNORMAL LOW (ref 0.30–0.70)

## 2011-06-10 MED ORDER — ASPIRIN EC 81 MG PO TBEC
81.0000 mg | DELAYED_RELEASE_TABLET | Freq: Every day | ORAL | Status: DC
  Administered 2011-06-11 – 2011-06-12 (×2): 81 mg via ORAL
  Filled 2011-06-10 (×3): qty 1

## 2011-06-10 MED ORDER — ASPIRIN 81 MG PO CHEW
CHEWABLE_TABLET | ORAL | Status: AC
  Filled 2011-06-10: qty 1

## 2011-06-10 NOTE — Progress Notes (Signed)
Subjective:  He feels better.  No chest pain or similar symptoms.  I suspect this may have been related to bradycardia with meds.  Old charts reviewed.  Seen in August and dose of metoprolol was 50, then apparently 100mg , but I cannot find evidence of when that occurred.  He said that his meds were increased at some point in time.  See results.    Objective:  Vital Signs in the last 24 hours: Temp:  [97.8 F (36.6 C)-98 F (36.7 C)] 97.8 F (36.6 C) (11/24 0500) Pulse Rate:  [40-60] 58  (11/24 0500) Resp:  [18-20] 20  (11/24 0500) BP: (116-128)/(68-79) 128/79 mmHg (11/23 2105) SpO2:  [96 %] 96 % (11/23 2105) Weight:  [95.664 kg (210 lb 14.4 oz)] 210 lb 14.4 oz (95.664 kg) (11/24 0500)  Intake/Output from previous day:     Physical Exam: General: Well developed, well nourished, in no acute distress. Head:  Normocephalic and atraumatic. Lungs: Clear to auscultation and percussion. Heart: Normal S1 and S2.  No murmur, rubs or gallops.  Pulses: Pulses normal in all 4 extremities. Extremities: No clubbing or cyanosis. No edema. Neurologic: Alert and oriented x 3.    Lab Results:  [facility] 06/09/11 0023  WBC 7.5  HGB 13.9  PLT 145*    Basename 06/09/11 0023  NA 138  K 3.6  CL 104  CO2 27  GLUCOSE 162*  BUN 26*  CREATININE 1.18    Basename 06/09/11 0600 06/09/11 0022  TROPONINI <0.30 <0.30   Hepatic Function Panel No results found for this basename: PROT,ALBUMIN,AST,ALT,ALKPHOS,BILITOT,BILIDIR,IBILI in the last 72 hours  Basename 06/09/11 0600  CHOL 153   No results found for this basename: PROTIME in the last 72 hours  Imaging: No results found.      Assessment/Plan:  Patient Active Hospital Problem List: Unstable angina (06/08/2011)   Assessment: No further chest pain   Plan: Monitot Bradycardia   Noted in hospital   Beta blockers on hold.  Will continue to monitor today given the severity of his presentation, then resume beta blockers at lower doses  likely. Discussed with patient in detail.       Shawnie Pons, MD, [facility], FSCAI 06/10/2011, 9:24 AM

## 2011-06-11 ENCOUNTER — Other Ambulatory Visit: Payer: Self-pay

## 2011-06-11 LAB — MAGNESIUM: Magnesium: 1.9 mg/dL (ref 1.5–2.5)

## 2011-06-11 LAB — GLUCOSE, CAPILLARY
Glucose-Capillary: 103 mg/dL — ABNORMAL HIGH (ref 70–99)
Glucose-Capillary: 142 mg/dL — ABNORMAL HIGH (ref 70–99)
Glucose-Capillary: 143 mg/dL — ABNORMAL HIGH (ref 70–99)

## 2011-06-11 LAB — BASIC METABOLIC PANEL
Chloride: 100 mEq/L (ref 96–112)
Creatinine, Ser: 1.19 mg/dL (ref 0.50–1.35)
GFR calc Af Amer: 77 mL/min — ABNORMAL LOW (ref >90)
Potassium: 3.9 mEq/L (ref 3.5–5.1)
Sodium: 136 mEq/L (ref 135–145)

## 2011-06-11 NOTE — Progress Notes (Addendum)
Patient Name: Steven Haas Date of Encounter: 06/11/2011, 8:49 AM    Subjective  Currently feels well, no CP or SOB. Did note that he "woke up suddenly" this morning and felt somewhat "weird" but isn't sure if this is related to time of telemetry finding. He does not think he was woken up by nursing staff at that time. I wrote a note for his son to have been out of work 11/22-11/23 to be here with his father.   Objective   Telemetry: 5 beats NSVT around 5:45 am, sinus bradycardia HR 50's mostly. No evidence for AV block.  Physical Exam: Filed Vitals:   06/11/11 0542  BP: 132/66  Pulse: 58  Temp: 97.8 F (36.6 C)  Resp: 18   General: Well developed, well nourished, in no acute distress. Head: Normocephalic, atraumatic, sclera non-icteric, no xanthomas, nares are without discharge.  Neck: Negative for carotid bruits. JVD not elevated. Lungs: Clear bilaterally to auscultation without wheezes, rales, or rhonchi. Breathing is unlabored. Heart: RRR S1 S2 without murmurs, rubs, or gallops.  Abdomen: Soft, non-tender, non-distended with normoactive bowel sounds. No hepatomegaly. No rebound/guarding. No obvious abdominal masses. Msk:  Strength and tone appear normal for age. Extremities: No clubbing or cyanosis. No edema.  Distal pedal pulses are 2+ and equal bilaterally. Neuro: Alert and oriented X 3. Moves all extremities spontaneously. Psych:  Responds to questions appropriately with a normal affect.   Intake/Output Summary (Last 24 hours) at 06/11/11 0840 Last data filed at 06/10/11 1700  Gross per 24 hour  Intake    240 ml  Output      0 ml  Net    240 ml    Labs:  [facility] 06/09/11 0023  NA 138  K 3.6  CL 104  CO2 27  GLUCOSE 162*  BUN 26*  CREATININE 1.18  CALCIUM 8.9  MG --  PHOS --    Basename 06/09/11 0023  WBC 7.5  NEUTROABS --  HGB 13.9  HCT 38.8*  MCV 84.7  PLT 145*    Basename 06/09/11 0600 06/09/11 0022 06/08/11 2016  CKTOTAL 78 87 101  CKMB  2.1 2.2 2.3  TROPONINI <0.30 <0.30 <0.30     Basename 06/09/11 1400  DDIMER 0.34     Basename 06/09/11 0600  CHOL 153  HDL 36*  LDLCALC 93  TRIG 161  CHOLHDL 4.3    Radiology/Studies:  None recently except cath - see that report.   Assessment and Plan  1. CP/presyncope - cath showed nonobstructive disease, initially thought related to bradycardia. BB d/c'd. However, patient had 5 beats of NSVT this morning, along with a couplet - question if this is related to his initial presentation. D/w Dr. Riley Kill - plan to keep in-house, obtain 2D echo tomorrow. WIll check lytes & magnesium this morning.  2. CAD - continue low-dose ASA, Plavix, statin. No BB given bradycardia.  3. LV dysfunction - had bedside echo 02/2011 showing EF 40-45%. Follow with formal 2D echo. Continue ACEI.   3. Acute (on possibly chronic) renal insufficiency. Lasix still on hold. Follow Cr today (last Cr 06/09/11).  Signed, Ronie Spies PA-C   Patient seen and reviewed in detail.  Has some NSVT.  Last EF was stable.  He feels ok.  Will keep one more day, monitor rhythm, then make final decisions.  Will watch rate today.  Shawnie Pons. 06/11/2011 9:15 AM

## 2011-06-12 ENCOUNTER — Other Ambulatory Visit: Payer: Self-pay

## 2011-06-12 DIAGNOSIS — R079 Chest pain, unspecified: Secondary | ICD-10-CM

## 2011-06-12 LAB — BASIC METABOLIC PANEL
BUN: 17 mg/dL (ref 6–23)
CO2: 28 mEq/L (ref 19–32)
Chloride: 103 mEq/L (ref 96–112)
Creatinine, Ser: 1.24 mg/dL (ref 0.50–1.35)
GFR calc Af Amer: 73 mL/min — ABNORMAL LOW (ref >90)

## 2011-06-12 LAB — CBC
HCT: 43.6 % (ref 39.0–52.0)
MCHC: 35.3 g/dL (ref 30.0–36.0)
MCV: 84.8 fL (ref 78.0–100.0)
RDW: 13.1 % (ref 11.5–15.5)

## 2011-06-12 NOTE — Progress Notes (Signed)
Pt discharge instructions given and pt verbalized understanding.   Pt verbalizes understanding of s/sx infection.  Will continue to monitor.  Steven Haas

## 2011-06-12 NOTE — Discharge Summary (Signed)
Patient ID: Steven Haas,  MRN: [id number], DOB/AGE: 1951-01-01 60 y.o.  Admit date: 06/08/2011 Discharge date: 06/12/2011  Primary MD: [name], MD, MD Primary Cardiologist: Lewayne Bunting MD  Discharge Diagnoses: Principal Problem:  *Unstable angina Active Problems: CAD (Remote MI 2004 s/p LAD PCI & s/p DES to mid LAD for ISR 01/2010) Systolic CHF (EF 40-98% w/ apical akinesis and mid-distal anteroseptal akinesis, grade 1 diastolic dysfunction on echo 06/12/11) DMII  HYPERTENSION Renal Insufficiency  (Stage 2) Bradycardia  Allergies No Known Allergies  Procedures:  06/09/11 - Right Heart Cath, Left Heart Cath, Selective Coronary Angiography, LV angiography Procedural Findings:   Hemodynamics  Oxygen saturations:   RA 6     PA 74% RV 27/5    AO 97% PA 25/8 (14)    SVC 76% PCWP 9  LV 95/7  AO 90/53969)  Cardiac Output (Fick) 7.6 l/m  Cardiac Index (Fick) 3.4l/m/M2   Coronary dominance: LCX  Left mainstem: Large caliber and free of significant disease  Left anterior descending (LAD): Calcified proximally followed by stent (with stent in a stent). There is diffuse 30-40% narrowing within the stented segment. The first diagonal has about 60% proximal narrowing and is at most moderate in size. The distal LAD is diffusely plaqued. There is one focal area of narrowing of 60% in the proximal portion of the distal vessel  Left circumflex (LCx): There is ramus vessel with mid 40% narrowing. This has a fairly small distribution. The CFX is a dominant vessel and provides several distal marginal/lateral branches. The third OM has a 40% proximal stenosis just beyond a vessel bifurcation. The PDA has mild irregularities  Right coronary artery (RCA): Non dominant without significant disease  Left ventriculography: Not performed due to creatinine  Final Conclusions: Continued patency of the LAD stent supplying the area of prior infarction. Normal R and Left heart pressures.    Recommendations: We will hold metoprolol and restart at lower dose. It possibly could be related to bradycardia.  06/12/11 - 2D Echocardiogram - Left ventricle: The cavity size was mildly dilated. Wall thickness was normal. Systolic function was mildly to moderately reduced. The estimated ejection fraction was in the range of 40% to 45%. There is akinesis of the mid-distalanteroseptal myocardium. There is akinesis of the apical myocardium. Doppler parameters are consistent with abnormal left ventricular relaxation (grade 1 diastolic dysfunction). - Left atrium: The atrium was mildly dilated.   History of Present Illness: 59yom w/ PMHx significant for CAD, systolic CHF, HTN, CKD, and DMII presented to [facility] with complaints of dizziness, nausea, and left arm pain. At [facility], CE's were negative x 1, EKG was without acute changes, and CXR showed cardiomegaly w/out acute disease. He was transferred to [facility] on 06/08/11 for further evaluation by heart catheterization.  Hospital Course: At [facility] his remaining two cardiac enzymes were negative. He underwent heart catheterization on 06/09/11 that revealed continued patency of the LAD stent supplying the area of prior infarction and normal right and left heart pressures. Recommendations were to hold his metoprolol and restart it at a lower dose, as this was thought to be contributing to his bradycardia and his presenting symptoms. He tolerated the procedure well and had no further dizziness, arm or chest pain.   On the morning of 06/11/11, 5 beats of NSVT were noted on telemetry at around 5:45am. The patient reported waking up suddenly and feeling somewhat "weird", but wasn't sure if this coincided with the timing of the  NSVT. No further episodes of NSVT occurred during the hospitalization.  An echocardiogram was performed on 06/12/11 with findings as above. On day of discharge he was feeling well. His beta blocker was  not continued on discharge, as he will follow up with Dr. Andee Lineman to determine appropriateness.  Discharge Vitals:  Temp:  [97.7 F (36.5 C)-97.8 F (36.6 C)] 97.7 F (36.5 C) (11/26 0522) Pulse Rate:  [59-65] 65  (11/26 0522) Resp:  [20] 20  (11/26 0522) BP: (127-151)/(72-88) 151/88 mmHg (11/26 0522) SpO2:  [95 %-97 %] 97 % (11/26 0522) Weight:  [95.936 kg (211 lb 8 oz)] 211 lb 8 oz (95.936 kg) (11/26 0522)  Weight change: -0.164 kg (-5.8 oz)  Labs:   Lab Results  Component Value Date   WBC 7.4 06/12/2011   HGB 15.4 06/12/2011   HCT 43.6 06/12/2011   MCV 84.8 06/12/2011   PLT 155 06/12/2011   Lab 06/12/11 0635  NA 138  K 4.2  CL 103  CO2 28  BUN 17  CREATININE 1.24  CALCIUM 9.5  GLUCOSE 114*    06/09/2011 06:00  Cholesterol 153  Triglycerides 120  HDL 36 (L)  LDL (calc) 93  VLDL 24    16/04/9603 20:16 06/09/2011 00:22 06/09/2011 06:00  CK, MB 2.3 2.2 2.1  CK Total 101 87 78  Troponin I <0.30 <0.30 <0.30     06/09/2011 14:00  D-Dimer, Quant 0.34    06/08/2011 20:16  Prothrombin Time 14.3  INR 1.09    Discharge Medications:  Current Discharge Medication List    CONTINUE these medications which have NOT CHANGED   Details  aspirin 325 MG tablet Take 325 mg by mouth daily.      clopidogrel (PLAVIX) 75 MG tablet Take 75 mg by mouth daily.      furosemide (LASIX) 20 MG tablet Take 1 tablet (20 mg total) by mouth daily. Qty: 30 tablet, Refills: 6    isosorbide mononitrate (IMDUR) 30 MG 24 hr tablet Take 1 tablet (30 mg total) by mouth daily. Qty: 90 tablet, Refills: 3    omeprazole (PRILOSEC) 20 MG capsule Take 20 mg by mouth daily.     pioglitazone-glimepiride (DUETACT) 30-2 MG per tablet Take 1 tablet by mouth daily.      pravastatin (PRAVACHOL) 40 MG tablet Take 40 mg by mouth at bedtime.     ramipril (ALTACE) 10 MG capsule Take 1 capsule (10 mg total) by mouth daily. Qty: 90 capsule, Refills: 3    Tamsulosin HCl (FLOMAX) 0.4 MG CAPS Take 0.4  mg by mouth daily.      nitroGLYCERIN (NITROSTAT) 0.4 MG SL tablet Place 0.4 mg under the tongue every 5 (five) minutes as needed. Not to exceed 3 in 15 minute time frame. For chest pain.      STOP taking these medications     metoprolol (TOPROL-XL) 100 MG 24 hr tablet         Outstanding Labs/Studies: None  Duration of Discharge Encounter: Greater than 30 minutes including physician time.  Signed, Lerlene Treadwell, PA-C 06/12/2011, 9:04 AM

## 2011-06-12 NOTE — Progress Notes (Signed)
  Echocardiogram 2D Echocardiogram has been performed.  Steven Haas [name] 06/12/2011, 10:23 AM

## 2011-06-12 NOTE — Progress Notes (Signed)
Patient ID: Steven Haas, male   DOB: 06/09/1951, 60 y.o.   MRN: [id number]  SUBJECTIVE: No CP, palpitation, presyncope. Ready to go home.  Filed Vitals:   06/11/11 0542 06/11/11 1500 06/11/11 2043 06/12/11 0522  BP: 132/66 130/72 127/72 151/88  Pulse: 58 63 59 65  Temp: 97.8 F (36.6 C) 97.8 F (36.6 C) 97.7 F (36.5 C) 97.7 F (36.5 C)  TempSrc: Oral Oral Oral Oral  Resp: 18 20 20 20   Height:      Weight:    95.936 kg (211 lb 8 oz)  SpO2: 94% 95% 97% 97%   No intake or output data in the 24 hours ending 06/12/11 0816  LABS: Basic Metabolic Panel:  Basename 06/12/11 0635 06/11/11 0910  NA 138 136  K 4.2 3.9  CL 103 100  CO2 28 26  GLUCOSE 114* 184*  BUN 17 15  CREATININE 1.24 1.19  CALCIUM 9.5 9.3  MG -- 1.9  PHOS -- --   Liver Function Tests: No results found for this basename: AST:2,ALT:2,ALKPHOS:2,BILITOT:2,PROT:2,ALBUMIN:2 in the last 72 hours No results found for this basename: LIPASE:2,AMYLASE:2 in the last 72 hours CBC:  Basename 06/12/11 0635  WBC 7.4  NEUTROABS --  HGB 15.4  HCT 43.6  MCV 84.8  PLT 155   Cardiac Enzymes: No results found for this basename: CKTOTAL:3,CKMB:3,CKMBINDEX:3,TROPONINI:3 in the last 72 hours BNP: No results found for this basename: POCBNP:3 in the last 72 hours D-Dimer:  Basename 06/09/11 1400  DDIMER 0.34   Hemoglobin A1C: No results found for this basename: HGBA1C in the last 72 hours Fasting Lipid Panel: No results found for this basename: CHOL,HDL,LDLCALC,TRIG,CHOLHDL,LDLDIRECT in the last 72 hours Thyroid Function Tests: No results found for this basename: TSH,T4TOTAL,FREET3,T3FREE,THYROIDAB in the last 72 hours Anemia Panel: No results found for this basename: VITAMINB12,FOLATE,FERRITIN,TIBC,IRON,RETICCTPCT in the last 72 hours  RADIOLOGY: No results found.  PHYSICAL EXAM No Change   TELEMETRY: Reviewed telemetry pt in Sinus Brady, No further NSVT last 24 hours.:  ASSESSMENT AND PLAN: Ready for  discharge. To have Echo this AM. I have called and they will do this am. Discharge on current meds. Needs work excuse for 2 weeks. See Gene Serpe and Dr Andee Lineman next week. To release back to work. Pt advised not to drive till follow up but resume normal activity. Principal Problem:  *Unstable angina Active Problems:  DM  HYPERTENSION  CAD  GERD  CHEST PAIN  Bradycardia    Valera Castle, MD 06/12/2011 8:16 AM

## 2011-06-12 NOTE — Progress Notes (Signed)
Patient ambulated 550 feet independently on room air with a steady gait.  Patient's HR remained stable and patient tolerated the ambulation well.  Will continue to monitor.

## 2011-06-16 ENCOUNTER — Telehealth: Payer: Self-pay | Admitting: Cardiology

## 2011-06-16 ENCOUNTER — Encounter: Payer: Self-pay | Admitting: *Deleted

## 2011-06-16 ENCOUNTER — Ambulatory Visit (INDEPENDENT_AMBULATORY_CARE_PROVIDER_SITE_OTHER): Payer: 59 | Admitting: *Deleted

## 2011-06-16 VITALS — BP 130/82 | HR 77 | Resp 16 | Ht 76.0 in | Wt 216.0 lb

## 2011-06-16 DIAGNOSIS — R000 Tachycardia, unspecified: Secondary | ICD-10-CM

## 2011-06-16 NOTE — Telephone Encounter (Signed)
Patient has OV with Gene on 12/3.

## 2011-06-16 NOTE — Telephone Encounter (Signed)
Steven Haas called and states that he feels his heart rate is too high. States he put a load of clothes in the dryer Today and became dizzy and then he could tell his heart rate was up. States that when he was discharged From [facility] on 11-26 they took him off Toprol XL 100 mg since he has been off the Toprol he continues to feel That his heart rate is to high. Please call on his cell # [phone]

## 2011-06-16 NOTE — Telephone Encounter (Signed)
Mr. Buchan was notified at 2:00pm to come to the office.

## 2011-06-16 NOTE — Progress Notes (Signed)
Patient in office for EKG & vitals.  Gene Serpe, PA reviewed EKG which showed no significant findings & heart rate of 85.    Advised patient per GS, normal EKG.  Follow up on Monday, 12/3 as previously planned.  Did advise to keep log of heart rates over weekend.

## 2011-06-16 NOTE — Telephone Encounter (Signed)
Have patient come in for an EKG today. Check vital signs. His heart rate is elevated he can restart Toprol. Next reconsult the latest on Monday, preferably today.

## 2011-06-19 ENCOUNTER — Encounter: Payer: Self-pay | Admitting: *Deleted

## 2011-06-19 ENCOUNTER — Encounter: Payer: Self-pay | Admitting: Physician Assistant

## 2011-06-19 ENCOUNTER — Ambulatory Visit (INDEPENDENT_AMBULATORY_CARE_PROVIDER_SITE_OTHER): Payer: 59 | Admitting: Physician Assistant

## 2011-06-19 DIAGNOSIS — I472 Ventricular tachycardia: Secondary | ICD-10-CM

## 2011-06-19 DIAGNOSIS — I252 Old myocardial infarction: Secondary | ICD-10-CM

## 2011-06-19 DIAGNOSIS — N182 Chronic kidney disease, stage 2 (mild): Secondary | ICD-10-CM | POA: Insufficient documentation

## 2011-06-19 DIAGNOSIS — R001 Bradycardia, unspecified: Secondary | ICD-10-CM

## 2011-06-19 DIAGNOSIS — I4729 Other ventricular tachycardia: Secondary | ICD-10-CM

## 2011-06-19 DIAGNOSIS — I10 Essential (primary) hypertension: Secondary | ICD-10-CM

## 2011-06-19 DIAGNOSIS — I2510 Atherosclerotic heart disease of native coronary artery without angina pectoris: Secondary | ICD-10-CM

## 2011-06-19 DIAGNOSIS — N289 Disorder of kidney and ureter, unspecified: Secondary | ICD-10-CM

## 2011-06-19 DIAGNOSIS — I498 Other specified cardiac arrhythmias: Secondary | ICD-10-CM

## 2011-06-19 MED ORDER — METOPROLOL SUCCINATE ER 25 MG PO TB24: 25.0000 mg | ORAL_TABLET | Freq: Every day | ORAL | Status: DC

## 2011-06-19 NOTE — Assessment & Plan Note (Signed)
Will check a followup BMET, on low-dose Lasix.

## 2011-06-19 NOTE — Assessment & Plan Note (Signed)
Well-controlled on current medication regimen 

## 2011-06-19 NOTE — Progress Notes (Signed)
Cc: CAD  HPI: Patient presents for post hospital followup.  Patient recently hospitalized at [facility] with presyncope. He reports today that he did not have any chest pain. He underwent extensive evaluation, including cardiac catheterization revealing nonobstructive CAD. Serial cardiac markers were negative. He was found to have significant bradycardia (50 bpm range), and beta blocker was discontinued. He did have transient NSVT. 2-D echo indicated EF 40-45%. Patient did have mild renal insufficiency, requiring temporary cessation of Lasix, and LVG was deferred.  Clinically, he has not had any recurrent presyncope. He denies any CP. He has not yet returned to work, pending reassessment. He denies any complications of the R. groin incision site.  PMH: reviewed and listed in Problem List in electronic Records (and see below)  Allergies/SH/FH: available in Electronic Records for review  Current Outpatient Prescriptions  Medication Sig Dispense Refill  . aspirin 325 MG tablet Take 325 mg by mouth daily.        . clopidogrel (PLAVIX) 75 MG tablet Take 75 mg by mouth daily.        . furosemide (LASIX) 20 MG tablet Take 1 tablet (20 mg total) by mouth daily.  30 tablet  6  . isosorbide mononitrate (IMDUR) 30 MG 24 hr tablet Take 1 tablet (30 mg total) by mouth daily.  90 tablet  3  . nitroGLYCERIN (NITROSTAT) 0.4 MG SL tablet Place 0.4 mg under the tongue every 5 (five) minutes as needed. Not to exceed 3 in 15 minute time frame. For chest pain.      Marland Kitchen omeprazole (PRILOSEC) 20 MG capsule Take 20 mg by mouth daily.       . pioglitazone-glimepiride (DUETACT) 30-2 MG per tablet Take 1 tablet by mouth daily.        . pravastatin (PRAVACHOL) 40 MG tablet Take 40 mg by mouth at bedtime.       . ramipril (ALTACE) 10 MG capsule Take 1 capsule (10 mg total) by mouth daily.  90 capsule  3  . Tamsulosin HCl (FLOMAX) 0.4 MG CAPS Take 0.4 mg by mouth daily.          ROS: no nausea, vomiting; no fever, chills; no  melena, hematochezia; no claudication  PHYSICAL EXAM:  BP 113/79  Pulse 92  Ht 6\' 4"  (1.93 m)  Wt 216 lb (97.977 kg)  BMI 26.29 kg/m2 GENERAL: 60 year old male, sitting upright; NAD HEENT: NCAT, PERRLA, EOMI; sclera clear; no xanthelasma NECK: palpable bilateral carotid pulses, no bruits; no JVD; no TM LUNGS: CTA bilaterally CARDIAC: RRR (S1, S2); no significant murmurs; no rubs or gallops ABDOMEN: soft, non-tender; intact BS EXTREMETIES: Stable right groin with palpable FP, no hematoma, and no bruit; no significant peripheral edema SKIN: warm/dry; no obvious rash/lesions MUSCULOSKELETAL: no joint deformity NEURO: no focal deficit; NL affect   EKG:    ASSESSMENT & PLAN:

## 2011-06-19 NOTE — Assessment & Plan Note (Signed)
Resolved off recent discontinuation of beta blocker. Of note, however, patient had previously been on as much as 100 mg daily. He does have mild LVD. Therefore, he will be rechallenged with beta blocker, but at 25 mg daily of Toprol XL. We'll reassess in one month. Patient has been cleared to resume driving and return to work on Monday, December 10.

## 2011-06-19 NOTE — Assessment & Plan Note (Addendum)
Continue current medication regimen. Recent catheterization indicated nonobstructive CAD. Will decrease aspirin to 81 mg daily, and continue Plavix indefinitely.

## 2011-06-19 NOTE — Patient Instructions (Signed)
Your physician recommends that you schedule a follow-up appointment on 07/25/11 @9 :00am with Dr. Andee Lineman.  Your physician has recommended you make the following change in your medication: START METOPROLOL SUCCINATE 25MG  ONE DAILY. DECREASE ASPIRIN TO 81MG  DAILY. Your new prescription has been sent to your pharamacy.  Your physician recommends that you return for lab work in: TODAY AT THE [facility] FOR BMET.  You may return to work on Monday 06/26/11. You have been provided with a note today.

## 2011-06-19 NOTE — Assessment & Plan Note (Signed)
EF 40-45%, by recent 2-D echo at [facility]. Patient presents with no signs/symptoms suggestive of CHF.

## 2011-06-19 NOTE — Assessment & Plan Note (Signed)
Beta blocker recently discontinued, secondary to SB in the 50 bpm range. We'll resume Toprol XL, but 25 mg daily.

## 2011-07-25 ENCOUNTER — Encounter: Payer: Self-pay | Admitting: Cardiology

## 2011-07-25 ENCOUNTER — Ambulatory Visit (INDEPENDENT_AMBULATORY_CARE_PROVIDER_SITE_OTHER): Payer: 59 | Admitting: Cardiology

## 2011-07-25 VITALS — BP 120/76 | HR 65 | Ht 76.0 in | Wt 219.0 lb

## 2011-07-25 DIAGNOSIS — R55 Syncope and collapse: Secondary | ICD-10-CM

## 2011-07-25 NOTE — Patient Instructions (Signed)
Continue all current medications. Your physician wants you to follow up in:  1 year.  You will receive a reminder letter in the mail one-two months in advance.  If you don't receive a letter, please call our office to schedule the follow up appointment   

## 2011-07-30 ENCOUNTER — Encounter: Payer: Self-pay | Admitting: Cardiology

## 2011-07-30 DIAGNOSIS — R55 Syncope and collapse: Secondary | ICD-10-CM | POA: Insufficient documentation

## 2011-07-30 NOTE — Progress Notes (Signed)
Steven Bottoms, MD, [facility] ABIM Board Certified in Adult Cardiovascular Medicine,Internal Medicine and Critical Care Medicine    CC: history of bradycardia and syncope  HPI:  The patient is a 61 year old male with a history of syncope related to bradycardia on high-dose beta blocker therapy.  He is now on low dose beta blocker therapy and has experienced a significant increase in his energy level.  He has had no further syncope.  He has a history of coronary artery disease and has received multiple stents in the past.  However, he reports no chest pain, shortness of breath, orthopnea or PND.  On his last cardiac catheterization.  He had nonobstructive coronary artery disease. He is otherwise stable from a cardiac perspective.  He reports no palpitations, presyncope or syncope.  He has a history of mild LV dysfunction, but there have been no heart failure admissions.   PMH: reviewed and listed in Problem List in Electronic Records (and see below) Past Medical History  Diagnosis Date  . Diabetes mellitus     Type 2  . GERD (gastroesophageal reflux disease)   . Hypertension   . CAD (coronary artery disease)     Remote MI 2004 s/p LAD PCI in [place], s/p DES to mid LAD for ISR 01/2010.  [History of multiple stents][  . LV dysfunction      40-45% with apical akinesis with scar as well as anterolateral hypokinesis by Dr. Margarita Mail note 02/2011,  was 50% at cath 01/2010  . Sinus bradycardia   . Renal insufficiency     Cr 1.35 as outpatient 03/2011  . Syncope     status post bradycardia on high-dose blocker therapy   Past Surgical History  Procedure Date  . Coronary stent placement     x2  . Shoulder surgery     Right  . Elbow surgery     Left  . Nose surgery     Tumors  . Coronary angioplasty     Allergies/SH/FHX : available in Electronic Records for review  No Known Allergies History   Social History  . Marital Status: Widowed    Spouse Name: N/A    Number of Children: N/A  .  Years of Education: N/A   Occupational History  . Not on file.   Social History Main Topics  . Smoking status: Never Smoker   . Smokeless tobacco: Never Used  . Alcohol Use: No  . Drug Use: No  . Sexually Active: Not on file   Other Topics Concern  . Not on file   Social History Narrative  . No narrative on file   Family History  Problem Relation Age of Onset  . Heart attack Father   . Stroke Father   . Hypertension Sister   . Hypertension Sister   . Hypertension Sister   . Hypertension Sister     Medications: Current Outpatient Prescriptions  Medication Sig Dispense Refill  . clopidogrel (PLAVIX) 75 MG tablet Take 75 mg by mouth daily.        . furosemide (LASIX) 20 MG tablet Take 1 tablet (20 mg total) by mouth daily.  30 tablet  6  . isosorbide mononitrate (IMDUR) 30 MG 24 hr tablet Take 1 tablet (30 mg total) by mouth daily.  90 tablet  3  . metoprolol succinate (TOPROL-XL) 25 MG 24 hr tablet Take 1 tablet (25 mg total) by mouth daily.  30 tablet  6  . nitroGLYCERIN (NITROSTAT) 0.4 MG SL tablet Place 0.4  mg under the tongue every 5 (five) minutes as needed. Not to exceed 3 in 15 minute time frame. For chest pain.      Marland Kitchen omeprazole (PRILOSEC) 20 MG capsule Take 20 mg by mouth daily.       . pioglitazone-glimepiride (DUETACT) 30-2 MG per tablet Take 1 tablet by mouth daily.        . pravastatin (PRAVACHOL) 40 MG tablet Take 40 mg by mouth at bedtime.       . ramipril (ALTACE) 10 MG capsule Take 1 capsule (10 mg total) by mouth daily.  90 capsule  3  . Tamsulosin HCl (FLOMAX) 0.4 MG CAPS Take 0.4 mg by mouth daily.          ROS: No nausea or vomiting. No fever or chills.No melena or hematochezia.No bleeding.No claudication  Physical Exam: BP 120/76  Pulse 65  Ht 6\' 4"  (1.93 m)  Wt 219 lb (99.338 kg)  BMI 26.66 kg/m2 General:well-nourished male in no distress. Neck:normal carotid upstroke.  No carotid bruits.  JVP is 5 cm.  No thyromegaly.  Nonnodular  thyroid. Lungs:clear breath sounds bilaterally without wheezing Cardiac:regular rate and rhythm with normal S1, S2 and no murmur, rubs or gallops Vascular:no edema.  Normal distal pulses bilaterally Skin:warm and dry. Physcologic:normal affect  12lead ECG:not performed Limited bedside ECHO:N/A   Patient Active Problem List  Diagnoses  . DM  . HYPERTENSION-stable  . OLD MYOCARDIAL INFARCTIONmild LV dysfunction, 40-45% by echocardiogram and 50% by ventriculogram.  . CADstatus post multiple prior interventions  . GERD  . CHEST PAIN- no recurrence  . Bradycardia-resolved after decreasing beta blocker therapy  . NSVT (nonsustained ventricular tachycardia)  . Renal insufficiency  . Syncope-resolved    PLAN   Patient has mild LV dysfunction but has no shortness of breath, using the neurologic was 1 and has no heart failure symptoms.  Continue current medical therapy.  No recurrence of syncope and with significant improvement the patient's energy level after decreasing the dose of beta blocker.  We will consider followup echocardiogram in one year.  No further ischemia testing is needed at the present time the patient is asymptomatic.  Would continue aspirin and Plavix indefinitely, given prior history of in-stent restenosis

## 2011-08-04 ENCOUNTER — Other Ambulatory Visit: Payer: Self-pay | Admitting: *Deleted

## 2011-08-04 MED ORDER — METOPROLOL SUCCINATE ER 25 MG PO TB24: 25.0000 mg | ORAL_TABLET | Freq: Every day | ORAL | Status: DC

## 2011-09-25 ENCOUNTER — Telehealth: Payer: Self-pay | Admitting: *Deleted

## 2011-09-25 NOTE — Telephone Encounter (Signed)
Patient c/o chest pain off & on over last several days.  Usually one NTG relieves pain, but has had to take two tabs on one occasion before getting releif.  States this feels like before when he had to have stents.  Advised him to go to ED for evaluation. Patient states that he didn't feel like it was that much of an emergency.  Advised him to at least try to see PMD [facility]) within next few days & definitely ED if symptoms worsen.  Offered OV with Gene Serpe, PA for 09/29/2011, but patient declined and said he would check with PMD first and/or wait to see how he feels.

## 2011-11-16 ENCOUNTER — Other Ambulatory Visit: Payer: Self-pay | Admitting: Physician Assistant

## 2011-11-16 ENCOUNTER — Encounter (HOSPITAL_COMMUNITY)
Admission: AD | Disposition: A | Discharge status: Home / Self Care | Payer: Self-pay | Source: Other Acute Inpatient Hospital | Attending: Cardiology

## 2011-11-16 ENCOUNTER — Encounter (HOSPITAL_COMMUNITY): Payer: Self-pay

## 2011-11-16 ENCOUNTER — Inpatient Hospital Stay (HOSPITAL_COMMUNITY)
Admission: AD | Admit: 2011-11-16 | Discharge: 2011-11-17 | DRG: 251 | Disposition: A | Discharge status: Home / Self Care | Payer: 59 | Source: Other Acute Inpatient Hospital | Attending: Cardiology | Admitting: Cardiology

## 2011-11-16 ENCOUNTER — Other Ambulatory Visit: Payer: Self-pay | Admitting: Cardiology

## 2011-11-16 DIAGNOSIS — R001 Bradycardia, unspecified: Secondary | ICD-10-CM | POA: Diagnosis present

## 2011-11-16 DIAGNOSIS — N182 Chronic kidney disease, stage 2 (mild): Secondary | ICD-10-CM | POA: Diagnosis present

## 2011-11-16 DIAGNOSIS — I10 Essential (primary) hypertension: Secondary | ICD-10-CM | POA: Insufficient documentation

## 2011-11-16 DIAGNOSIS — K219 Gastro-esophageal reflux disease without esophagitis: Secondary | ICD-10-CM | POA: Diagnosis present

## 2011-11-16 DIAGNOSIS — I200 Unstable angina: Secondary | ICD-10-CM

## 2011-11-16 DIAGNOSIS — I2510 Atherosclerotic heart disease of native coronary artery without angina pectoris: Principal | ICD-10-CM | POA: Diagnosis present

## 2011-11-16 DIAGNOSIS — I129 Hypertensive chronic kidney disease with stage 1 through stage 4 chronic kidney disease, or unspecified chronic kidney disease: Secondary | ICD-10-CM | POA: Diagnosis present

## 2011-11-16 DIAGNOSIS — E785 Hyperlipidemia, unspecified: Secondary | ICD-10-CM | POA: Diagnosis present

## 2011-11-16 DIAGNOSIS — R079 Chest pain, unspecified: Secondary | ICD-10-CM

## 2011-11-16 DIAGNOSIS — Z9861 Coronary angioplasty status: Secondary | ICD-10-CM

## 2011-11-16 DIAGNOSIS — I498 Other specified cardiac arrhythmias: Secondary | ICD-10-CM | POA: Diagnosis present

## 2011-11-16 DIAGNOSIS — E119 Type 2 diabetes mellitus without complications: Secondary | ICD-10-CM | POA: Diagnosis present

## 2011-11-16 HISTORY — PX: LEFT HEART CATHETERIZATION WITH CORONARY ANGIOGRAM: SHX5451

## 2011-11-16 LAB — GLUCOSE, CAPILLARY
Glucose-Capillary: 92 mg/dL (ref 70–99)
Glucose-Capillary: 227 mg/dL — ABNORMAL HIGH (ref 70–99)

## 2011-11-16 LAB — POCT ACTIVATED CLOTTING TIME: Activated Clotting Time: 446 seconds

## 2011-11-16 SURGERY — LEFT HEART CATHETERIZATION WITH CORONARY ANGIOGRAM
Anesthesia: LOCAL

## 2011-11-16 MED ORDER — HEPARIN SODIUM (PORCINE) 1000 UNIT/ML IJ SOLN
INTRAMUSCULAR | Status: AC
  Filled 2011-11-16: qty 1

## 2011-11-16 MED ORDER — NITROGLYCERIN 0.4 MG SL SUBL: 0.4000 mg | SUBLINGUAL_TABLET | SUBLINGUAL | Status: DC | PRN

## 2011-11-16 MED ORDER — ACETAMINOPHEN 325 MG PO TABS
650.0000 mg | ORAL_TABLET | ORAL | Status: DC | PRN
  Administered 2011-11-16: 650 mg via ORAL
  Filled 2011-11-16: qty 2

## 2011-11-16 MED ORDER — SODIUM CHLORIDE 0.9 % IV SOLN
1.0000 mL/kg/h | INTRAVENOUS | Status: AC
  Administered 2011-11-16: 1 mL/kg/h via INTRAVENOUS

## 2011-11-16 MED ORDER — RAMIPRIL 10 MG PO CAPS
10.0000 mg | ORAL_CAPSULE | Freq: Every day | ORAL | Status: DC
  Administered 2011-11-17: 10 mg via ORAL
  Filled 2011-11-16: qty 1

## 2011-11-16 MED ORDER — SODIUM CHLORIDE 0.9 % IV SOLN: INTRAVENOUS | Status: DC

## 2011-11-16 MED ORDER — PIOGLITAZONE HCL 30 MG PO TABS
30.0000 mg | ORAL_TABLET | Freq: Every day | ORAL | Status: DC
  Administered 2011-11-17: 30 mg via ORAL
  Filled 2011-11-16: qty 1

## 2011-11-16 MED ORDER — LIDOCAINE HCL (PF) 1 % IJ SOLN
INTRAMUSCULAR | Status: AC
  Filled 2011-11-16: qty 30

## 2011-11-16 MED ORDER — TAMSULOSIN HCL 0.4 MG PO CAPS
0.4000 mg | ORAL_CAPSULE | Freq: Every day | ORAL | Status: DC
  Administered 2011-11-17: 0.4 mg via ORAL
  Filled 2011-11-16: qty 1

## 2011-11-16 MED ORDER — PNEUMOCOCCAL VAC POLYVALENT 25 MCG/0.5ML IJ INJ
0.5000 mL | INJECTION | INTRAMUSCULAR | Status: DC
  Filled 2011-11-16: qty 0.5

## 2011-11-16 MED ORDER — HEPARIN (PORCINE) IN NACL 100-0.45 UNIT/ML-% IJ SOLN
1240.0000 [IU]/h | INTRAMUSCULAR | Status: DC
  Filled 2011-11-16: qty 250

## 2011-11-16 MED ORDER — NITROGLYCERIN 0.2 MG/ML ON CALL CATH LAB
INTRAVENOUS | Status: AC
  Filled 2011-11-16: qty 1

## 2011-11-16 MED ORDER — SODIUM CHLORIDE 0.9 % IJ SOLN: 3.0000 mL | INTRAMUSCULAR | Status: DC | PRN

## 2011-11-16 MED ORDER — GLIMEPIRIDE 2 MG PO TABS
2.0000 mg | ORAL_TABLET | Freq: Every day | ORAL | Status: DC
  Administered 2011-11-17: 2 mg via ORAL
  Filled 2011-11-16 (×2): qty 1

## 2011-11-16 MED ORDER — INSULIN ASPART 100 UNIT/ML ~~LOC~~ SOLN: 0.0000 [IU] | Freq: Three times a day (TID) | SUBCUTANEOUS | Status: DC

## 2011-11-16 MED ORDER — HEPARIN (PORCINE) IN NACL 2-0.9 UNIT/ML-% IJ SOLN
INTRAMUSCULAR | Status: AC
  Filled 2011-11-16: qty 2000

## 2011-11-16 MED ORDER — MIDAZOLAM HCL 2 MG/2ML IJ SOLN
INTRAMUSCULAR | Status: AC
  Filled 2011-11-16: qty 2

## 2011-11-16 MED ORDER — PANTOPRAZOLE SODIUM 40 MG PO TBEC: 40.0000 mg | DELAYED_RELEASE_TABLET | Freq: Every day | ORAL | Status: DC

## 2011-11-16 MED ORDER — DIAZEPAM 5 MG PO TABS
5.0000 mg | ORAL_TABLET | ORAL | Status: AC
  Administered 2011-11-16: 5 mg via ORAL
  Filled 2011-11-16: qty 1

## 2011-11-16 MED ORDER — METOPROLOL SUCCINATE ER 25 MG PO TB24
25.0000 mg | ORAL_TABLET | Freq: Every day | ORAL | Status: DC
  Filled 2011-11-16: qty 1

## 2011-11-16 MED ORDER — INSULIN ASPART 100 UNIT/ML ~~LOC~~ SOLN: 0.0000 [IU] | Freq: Every day | SUBCUTANEOUS | Status: DC

## 2011-11-16 MED ORDER — ASPIRIN EC 81 MG PO TBEC
81.0000 mg | DELAYED_RELEASE_TABLET | Freq: Every day | ORAL | Status: DC
  Administered 2011-11-17: 81 mg via ORAL
  Filled 2011-11-16: qty 1

## 2011-11-16 MED ORDER — SODIUM CHLORIDE 0.9 % IJ SOLN: 3.0000 mL | Freq: Two times a day (BID) | INTRAMUSCULAR | Status: DC

## 2011-11-16 MED ORDER — ATORVASTATIN CALCIUM 40 MG PO TABS
40.0000 mg | ORAL_TABLET | Freq: Every day | ORAL | Status: DC
  Filled 2011-11-16 (×2): qty 1

## 2011-11-16 MED ORDER — BIVALIRUDIN 250 MG IV SOLR
INTRAVENOUS | Status: AC
  Filled 2011-11-16: qty 250

## 2011-11-16 MED ORDER — FENTANYL CITRATE 0.05 MG/ML IJ SOLN
INTRAMUSCULAR | Status: AC
  Filled 2011-11-16: qty 2

## 2011-11-16 MED ORDER — ISOSORBIDE MONONITRATE ER 30 MG PO TB24: 30.0000 mg | ORAL_TABLET | Freq: Every day | ORAL | Status: DC

## 2011-11-16 MED ORDER — ONDANSETRON HCL 4 MG/2ML IJ SOLN
4.0000 mg | Freq: Four times a day (QID) | INTRAMUSCULAR | Status: DC | PRN
  Administered 2011-11-17: 4 mg via INTRAVENOUS
  Filled 2011-11-16: qty 2

## 2011-11-16 MED ORDER — CLOPIDOGREL BISULFATE 75 MG PO TABS
75.0000 mg | ORAL_TABLET | Freq: Every day | ORAL | Status: DC
  Administered 2011-11-17: 75 mg via ORAL
  Filled 2011-11-16 (×2): qty 1

## 2011-11-16 MED ORDER — SODIUM CHLORIDE 0.9 % IV SOLN: 250.0000 mL | INTRAVENOUS | Status: DC | PRN

## 2011-11-16 NOTE — H&P (View-Only) (Signed)
NAME:  Steven Haas, Steven Haas ENICK ROOM: 210  UNIT NUMBER:  [id number] LOCATION: 2F 210 01 ADM/VISIT DATE:  11/15/2011   ADM PHYS:  ACCT:  [id number] DOB: 02/20/1951   REFERRING PHYSICIAN:  Chan Park, M.D.  REASON FOR CONSULTATION:  Substernal chest pain.  HISTORY OF PRESENT ILLNESS:  The patient is a very pleasant 61-year-old male with a history of significant coronary artery disease.  The patient has been admitted with substernal chest pain, which has been intermittent, typically lasting 10-15 minutes both at rest and on exertion.  He reports that his pain is heavy in nature, can get quite severe, and is very similar to his chest pain prior to his interventions both in 2004 and 2011.  For about a week also he has noticed weakness and fatigue, which he states is very unusual for him.  An episode yesterday happened while at work and doing training exercise, when he developed severe substernal chest pain.  He took 2 sublingual nitroglycerin with mild improvement in his symptoms, but then he went to the emergency room where he was given aspirin and several more aspirins.  He states with the last dose of nitroglycerin his pain essentially resolved.  He also was placed on IV heparin.  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  Enzymes have been negative, also.  The patient, as mentioned above, has a significant history of coronary artery disease, which is delineated below in the past medical history.  He also has an ejection fraction of 40% to 45%, but presents with no heart failure symptoms.  He also has mild renal insufficiency, with a creatinine of 1.40.  This has been stable for quite some time.  During his last catheterization in 2011, he had no contrast-induced nephropathy.  We have been seeing him in the clinic also for a history of syncope, which was related to significant bradycardia and high-dose beta  blocker therapy which was subsequently cut, and heart rates typically now run in the 50s to low 60 rate.  However, it does not appear that this is contributing to his weakness.  In looking back at my records, the patient always had heart rates in the 50s and 60s.  The patient has multiple risk factors, including diabetes mellitus, hypertension, of course, coronary artery disease with an old myocardial infarction back in [place] in 2004.  He also has a history of nonsustained ventricular tachycardia, but no further arrhythmias have been noted during this hospitalization.  PAST MEDICAL HISTORY: 1. Diabetes mellitus type 2. 2. GERD. 3. Hypertension. 4. Coronary artery disease with myocardial infarction in 2004 at [place], with 2 stents to the LAD, status post percutaneous coronary intervention and placement of drug-eluting stent in the mid LAD in an area of severe in-stent restenosis by Dr. McElhaney in 01/2010. 5. LV dysfunction 40% to 45%, with apical akinesis with scar, as well as anterolateral hypokinesis noted by echo 02/2011.  Ejection fraction was 50% during the catheterization in 01/2010. 6. History of sinus bradycardia. 7. Renal insufficiency.  Creatinine 1.35 in 03/2011, currently 1.40. 8. History of syncope secondary to bradycardia, on high-dose beta blocker therapy; no recurrence.  PAST SURGICAL HISTORY: 1. Coronary artery stent placement x3. 2. Shoulder surgery on the right. 3. Elbow surgery on the left. 4. No surgery for tumors.  ALLERGIES:   No known drug allergies.  SOCIAL HISTORY:  The patient is widowed.  His wife died 2 years   ago.  The patient never smoked.  He does not use any alcohol, and he has no history of drug use.  FAMILY HISTORY:  Noncontributory.  MEDICATIONS AS AN OUTPATIENT:  Include: 1. Plavix 75 mg p.o. daily. 2. Furosemide 20 mg p.o. daily. 3. Isosorbide mononitrate 30 mg q.24 hours. 4. Metoprolol succinate 25 mg p.o. daily. 5. Nitroglycerin p.r.n.  sublingual 0.4. 6. Omeprazole 20 mg p.o. q. day. 7. Pioglitazone/glimepiride (Duetact) 30/2 mg once daily. 8. Pravastatin 40 mg at bedtime. 9. Ramipril 10 mg p.o. daily. 10. Flomax 0.4 mg p.o. daily.  REVIEW OF SYSTEMS:  No nausea or vomiting.  No fever or chills.  No melena, hematochezia.  No claudication.  No orthopnea or PND.  No syncope.  No visual changes.  No neurologic symptoms.  The remainder of the 18-point review of systems is negative.  PHYSICAL EXAMINATION:  Vital signs:  Blood pressure 120/76, heart rate 62 bpm, weight 219 pounds.  Oxygenation is 99%.  Temperature is afebrile.  General:  Well-nourished white male in no apparent distress.  HEENT:  [place]/AT, PERRLA, EOMI.  Sclerae clear.  Neck:  Supple.  JVP approximately 5 cm.  No carotid bruits with normal carotid upstroke and no  thyromegaly.  No lymphadenopathy.  Cardiac:  PMI is nondisplaced, regular rate and rhythm, with normal S1, S2.  No S3 or S4.  No pathological murmurs.  Lungs:  Normal effort with clear breath sounds bilaterally and no wheezing.  Skin:  No rash or lesions.  Abdomen:  Soft, nontender.  No rebound or guarding, and good bowel sounds.  There is no hepatosplenomegaly.  Genitourinary:  Deferred.  Rectal:  Deferred.  Extremities:  No cyanosis, clubbing, or edema.  No rash or lesions.  No petechiae, and normal pulses bilaterally dorsalis pedis and posterior tibial pulses, as well as both femoral pulses.  Musculoskeletal:  No joint deformities, kyphosis, or CVA tenderness.  Neuropsychologic:  Alert and oriented.  Cranial nerves grossly intact.  No focal findings on exam.  Normal mood.  IMAGING:  Chest x-ray:  Mild cardiomegaly.  A 12-lead electrocardiogram:  Normal sinus rhythm with an old anterior wall myocardial infarction.  LABORATORY WORK:  White count 5600, hemoglobin 14.5, hematocrit 43.3, platelet count 171,000.  BMET:  Glucose 202, BUN 21, creatinine 1.40, calcium 9.0, sodium 138, potassium 3.7, CO2 of 29.  Cardiac  enzymes negative x3:  Troponin 0.01, 0.01, and 0.01, and CK, CK-MB index also normal.  Coagulation parameters:  Unfractionated heparin 0.83.  PROBLEM LIST: 1. Unstable angina/acute coronary syndrome. 2. Coronary artery disease status post percutaneous coronary intervention stent to the left anterior descending 2004. a. Status post drug-eluting stent in the mid left anterior descending for in-stent restenosis 01/2010. b. On aspirin and Plavix throughout. 3. Left ventricular dysfunction, with no current heart failure.  Ejection fraction 40% to 45%. 4. Sinus bradycardia, stable. 5. Renal insufficiency.  Creatinine stable at 1.4. 6. History of syncope, no recurrence.  PLAN: 1. The patient's symptoms are very reminiscent of his prior symptoms of angina.  I suspect that he has an acute coronary syndrome, although is enzymes are negative and his EKG shows no acute changes, but the patient is currently pain free. 2. I discussed with the patient the options, and he is in agreement to proceed with cardiac catheterization.  This will be done at [facility] later this afternoon.  We will keep the patient n.p.o., and he will be continued on aspirin, Plavix, and heparin in the interim. 3. We will   also start hydration with half-normal saline at 100 mL/hour, given his underlying renal insufficiency. 4. No V-gram during the diagnostic catheterization and minimal dye at that time, so hopefully the patient can proceed with an ad hoc intervention, if needed, today. 5. All the patient's questions were answered, and he is in agreement.  He understands the risks and benefits of a diagnostic cardiac catheterization.   __________________________    Plato Alspaugh, M.D. /landm D: 11/16/2011 1012 T: 11/16/2011 1042 P: DEG  cc:  Chan Park, M.D.  

## 2011-11-16 NOTE — Interval H&P Note (Signed)
History and Physical Interval Note:  11/16/2011 4:02 PM  Steven Haas  has presented today for surgery, with the diagnosis of chest pain  The various methods of treatment have been discussed with the patient and family. After consideration of risks, benefits and other options for treatment, the patient has consented to  Procedure(s) (LRB): LEFT HEART CATHETERIZATION WITH CORONARY ANGIOGRAM (N/A) as a surgical intervention .  The patients' history has been reviewed, patient examined, no change in status, stable for surgery.  I have reviewed the patients' chart and labs.  Questions were answered to the patient's satisfaction.     Taner Rzepka [employer]

## 2011-11-16 NOTE — Progress Notes (Signed)
TR BAND REMOVAL  LOCATION:    right radial  DEFLATED PER PROTOCOL:    yes  TIME BAND OFF / DRESSING APPLIED:    2140   SITE UPON ARRIVAL:    Level 0  SITE AFTER BAND REMOVAL:    Level 0  REVERSE ALLEN'S TEST:     positive  CIRCULATION SENSATION AND MOVEMENT:    Within Normal Limits   yes  COMMENTS:   Patient provided with instructions to continue to limit movement of right upper extremity; continue to elevate right upper extremity above level of heart; notify RN promptly of any signs of bleeding, swelling or increased pain to right upper extremity.

## 2011-11-16 NOTE — Progress Notes (Signed)
ANTICOAGULATION CONSULT NOTE - Initial Consult  Pharmacy Consult for heparin Indication: chest pain/ACS  No Known Allergies  Patient Measurements: Height: 6\' 4"  (193 cm) Weight: 210 lb 15.7 oz (95.7 kg) IBW/kg (Calculated) : 86.8    Vital Signs: Temp: 97.8 F (36.6 C) (05/02 1300) Temp src: Oral (05/02 1300) BP: 137/76 mmHg (05/02 1300) Pulse Rate: 53  (05/02 1300)  Labs: No results found for this basename: HGB:2,HCT:3,PLT:3,APTT:3,LABPROT:3,INR:3,HEPARINUNFRC:3,CREATININE:3,CKTOTAL:3,CKMB:3,TROPONINI:3 in the last 72 hours Estimated Creatinine Clearance: 79.7 ml/min (by C-G formula based on Cr of 1.24).  Medical History: Past Medical History  Diagnosis Date  . Diabetes mellitus     Type 2  . GERD (gastroesophageal reflux disease)   . Hypertension   . CAD (coronary artery disease)     Remote MI 2004 s/p LAD PCI in [place], s/p DES to mid LAD for ISR 01/2010.  [History of multiple stents][  . LV dysfunction      40-45% with apical akinesis with scar as well as anterolateral hypokinesis by Dr. Margarita Mail note 02/2011,  was 50% at cath 01/2010  . Sinus bradycardia   . Renal insufficiency     Cr 1.35 as outpatient 03/2011  . Syncope     status post bradycardia on high-dose blocker therapy    Medications:  Prescriptions prior to admission  Medication Sig Dispense Refill  . aspirin 81 MG chewable tablet Chew 81 mg by mouth daily.      . clopidogrel (PLAVIX) 75 MG tablet Take 75 mg by mouth daily.        . furosemide (LASIX) 20 MG tablet Take 1 tablet (20 mg total) by mouth daily.  30 tablet  6  . isosorbide mononitrate (IMDUR) 30 MG 24 hr tablet Take 1 tablet (30 mg total) by mouth daily.  90 tablet  3  . metoprolol succinate (TOPROL-XL) 25 MG 24 hr tablet Take 1 tablet (25 mg total) by mouth daily.  90 tablet  3  . nitroGLYCERIN (NITROSTAT) 0.4 MG SL tablet Place 0.4 mg under the tongue every 5 (five) minutes as needed. For chest pain; Not to exceed 3 in 15 minute time frame.  For chest pain.      Marland Kitchen omeprazole (PRILOSEC) 20 MG capsule Take 20 mg by mouth daily.       . pioglitazone-glimepiride (DUETACT) 30-2 MG per tablet Take 1 tablet by mouth daily.        . pravastatin (PRAVACHOL) 40 MG tablet Take 40 mg by mouth at bedtime.       . ramipril (ALTACE) 10 MG capsule Take 1 capsule (10 mg total) by mouth daily.  90 capsule  3  . Tamsulosin HCl (FLOMAX) 0.4 MG CAPS Take 0.4 mg by mouth daily.         Assessment: Mr Matsuura is a 61 yo WM transferred from [facility] for cardiac cath.  He was started on heparin for ACS there on 5/1.  He was given a 5000 unit bolus and drip at 1480 units/hr.  The first heparin level was 0.69 on 1480 units/hr.  The am heparin level 5/2 was 0.83 on 1480 units/hr and the rate was reduced to 1240 units/hr.  His H/H is stable and per pt he is going to cath tonight. Goal of Therapy:  INR 2-3   Plan:  Continue current heparin rate of 1240 units/hr and f/u after cath to see if heparin resumed. If resumed will order daily heparin levels  Herby Abraham, Pharm.D. [phone] 11/16/2011 2:35 PM

## 2011-11-16 NOTE — Procedures (Signed)
   Cardiac Catheterization Procedure Note  Name: Steven Haas MRN: [id number] DOB: 20-Apr-1951  Procedure: Left Heart Cath, Selective Coronary Angiography  Indication: Unstable angina   Procedural Details: The right wrist was prepped, draped, and anesthetized with 1% lidocaine. Using the modified Seldinger technique, a 5 French sheath was introduced into the right radial artery. 2.5 mg of nicardipine was administered through the sheath, weight-based unfractionated heparin was administered intravenously. Standard Judkins catheters were used for selective coronary angiography. Catheter exchanges were performed over an exchange length guidewire. There were no immediate procedural complications.   Procedural Findings: Hemodynamics: AO 83/51 LV 84/9  Coronary angiography: Coronary dominance: right  Left mainstem: No significant disease.   Left anterior descending (LAD): 80% in-stent restenosis in proximal LAD.  30% ostial D1 and ostial D2 stenosis.   Left circumflex (LCx): Large, dominant LCx.  Ramus with 30% ostial stenosis.  Luminal irregularities in the remainder of the LCx system.   Right coronary artery (RCA): Small, nondominant RCA with luminal irregularities.   Left ventriculography: Not done due to CKD.   Final Conclusions:  80% in-stent restenosis in proximal LAD.  Given unstable angina symptoms, plan intervention.  He has been on Plavix.   Steven Haas 11/16/2011, 4:51 PM

## 2011-11-16 NOTE — CV Procedure (Signed)
   CARDIAC CATH NOTE  Name: Steven Haas MRN: [id number] DOB: [birth year]  Procedure: PTCA of the proximal LAD  Indication: 61 yo male with history of CAD s/p DES of proximal LAD in 2011. Presents with recurrent angina. Diagnostic cath shows an 80% stenosis within the stent.   Procedural Details: The 5 french diagnostic sheath was exchanged for a 6 french sheath.  Weight-based bivalirudin was given for anticoagulation. Once a therapeutic ACT was achieved, a 6 [place] XBLAD guide catheter was inserted.  A prowater coronary guidewire was used to cross the lesion.  The lesion was dilated with a 3.0x10 mm cutting balloon to 6 then 8 atm.   Following PCI, there was 0% residual stenosis and TIMI-3 flow. Final angiography confirmed an excellent result. The patient tolerated the procedure well. There were no immediate procedural complications. A TR band was used for radial hemostasis. The patient was transferred to the post catheterization recovery area for further monitoring.  Lesion Data: Vessel: Proximal LAD Percent stenosis (pre): 80% TIMI-flow (pre):  3 Stent:  N/A Percent stenosis (post): 0% TIMI-flow (post): 3  Conclusions: Successful cutting balloon PCI of the proximal LAD for in stent restenosis.  Recommendations: Continue ASA and Plavix.   Theron Arista [facility] 11/16/2011, 5:53 PM

## 2011-11-16 NOTE — H&P (Signed)
NAME:  Steven Haas, Steven Haas [facility] ROOM: 210  UNIT NUMBER:  [id number] LOCATION: 58F 210 01 ADM/VISIT DATE:  11/15/2011   ADM Vaughan BrownerKathaleen Grinder:  [id number] DOB: 11-23-1950   REFERRING PHYSICIAN:  Meredith Mody, M.D.  REASON FOR CONSULTATION:  Substernal chest pain.  HISTORY OF PRESENT ILLNESS:  The patient is a very pleasant 61 year old male with a history of significant coronary artery disease.  The patient has been admitted with substernal chest pain, which has been intermittent, typically lasting 10-15 minutes both at rest and on exertion.  He reports that his pain is heavy in nature, can get quite severe, and is very similar to his chest pain prior to his interventions both in 2004 and 2011.  For about a week also he has noticed weakness and fatigue, which he states is very unusual for him.  An episode yesterday happened while at work and doing training exercise, when he developed severe substernal chest pain.  He took 2 sublingual nitroglycerin with mild improvement in his symptoms, but then he went to the emergency room where he was given aspirin and several more aspirins.  He states with the last dose of nitroglycerin his pain essentially resolved.  He also was placed on IV heparin.  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  Enzymes have been negative, also.  The patient, as mentioned above, has a significant history of coronary artery disease, which is delineated below in the past medical history.  He also has an ejection fraction of 40% to 45%, but presents with no heart failure symptoms.  He also has mild renal insufficiency, with a creatinine of 1.40.  This has been stable for quite some time.  During his last catheterization in 2011, he had no contrast-induced nephropathy.  We have been seeing him in the clinic also for a history of syncope, which was related to significant bradycardia and high-dose beta  blocker therapy which was subsequently cut, and heart rates typically now run in the 50s to low 60 rate.  However, it does not appear that this is contributing to his weakness.  In looking back at my records, the patient always had heart rates in the 50s and 60s.  The patient has multiple risk factors, including diabetes mellitus, hypertension, of course, coronary artery disease with an old myocardial infarction back in [place] in 2004.  He also has a history of nonsustained ventricular tachycardia, but no further arrhythmias have been noted during this hospitalization.  PAST MEDICAL HISTORY: 1. Diabetes mellitus type 2. 2. GERD. 3. Hypertension. 4. Coronary artery disease with myocardial infarction in 2004 at [place], with 2 stents to the LAD, status post percutaneous coronary intervention and placement of drug-eluting stent in the mid LAD in an area of severe in-stent restenosis by Dr. Camillo Flaming in 01/2010. 5. LV dysfunction 40% to 45%, with apical akinesis with scar, as well as anterolateral hypokinesis noted by echo 02/2011.  Ejection fraction was 50% during the catheterization in 01/2010. 6. History of sinus bradycardia. 7. Renal insufficiency.  Creatinine 1.35 in 03/2011, currently 1.40. 8. History of syncope secondary to bradycardia, on high-dose beta blocker therapy; no recurrence.  PAST SURGICAL HISTORY: 1. Coronary artery stent placement x3. 2. Shoulder surgery on the right. 3. Elbow surgery on the left. 4. No surgery for tumors.  ALLERGIES:   No known drug allergies.  SOCIAL HISTORY:  The patient is widowed.  His wife died 2 years  ago.  The patient never smoked.  He does not use any alcohol, and he has no history of drug use.  FAMILY HISTORY:  Noncontributory.  MEDICATIONS AS AN OUTPATIENT:  Include: 1. Plavix 75 mg p.o. daily. 2. Furosemide 20 mg p.o. daily. 3. Isosorbide mononitrate 30 mg q.24 hours. 4. Metoprolol succinate 25 mg p.o. daily. 5. Nitroglycerin p.r.n.  sublingual 0.4. 6. Omeprazole 20 mg p.o. q. day. 7. Pioglitazone/glimepiride (Duetact) 30/2 mg once daily. 8. Pravastatin 40 mg at bedtime. 9. Ramipril 10 mg p.o. daily. 10. Flomax 0.4 mg p.o. daily.  REVIEW OF SYSTEMS:  No nausea or vomiting.  No fever or chills.  No melena, hematochezia.  No claudication.  No orthopnea or PND.  No syncope.  No visual changes.  No neurologic symptoms.  The remainder of the 18-point review of systems is negative.  PHYSICAL EXAMINATION:  Vital signs:  Blood pressure 120/76, heart rate 62 bpm, weight 219 pounds.  Oxygenation is 99%.  Temperature is afebrile.  General:  Well-nourished white male in no apparent distress.  HEENT:  [place]/AT, PERRLA, EOMI.  Sclerae clear.  Neck:  Supple.  JVP approximately 5 cm.  No carotid bruits with normal carotid upstroke and no  thyromegaly.  No lymphadenopathy.  Cardiac:  PMI is nondisplaced, regular rate and rhythm, with normal S1, S2.  No S3 or S4.  No pathological murmurs.  Lungs:  Normal effort with clear breath sounds bilaterally and no wheezing.  Skin:  No rash or lesions.  Abdomen:  Soft, nontender.  No rebound or guarding, and good bowel sounds.  There is no hepatosplenomegaly.  Genitourinary:  Deferred.  Rectal:  Deferred.  Extremities:  No cyanosis, clubbing, or edema.  No rash or lesions.  No petechiae, and normal pulses bilaterally dorsalis pedis and posterior tibial pulses, as well as both femoral pulses.  Musculoskeletal:  No joint deformities, kyphosis, or CVA tenderness.  Neuropsychologic:  Alert and oriented.  Cranial nerves grossly intact.  No focal findings on exam.  Normal mood.  IMAGING:  Chest x-ray:  Mild cardiomegaly.  A 12-lead electrocardiogram:  Normal sinus rhythm with an old anterior wall myocardial infarction.  LABORATORY WORK:  White count 5600, hemoglobin 14.5, hematocrit 43.3, platelet count 171,000.  BMET:  Glucose 202, BUN 21, creatinine 1.40, calcium 9.0, sodium 138, potassium 3.7, CO2 of 29.  Cardiac  enzymes negative x3:  Troponin 0.01, 0.01, and 0.01, and CK, CK-MB index also normal.  Coagulation parameters:  Unfractionated heparin 0.83.  PROBLEM LIST: 1. Unstable angina/acute coronary syndrome. 2. Coronary artery disease status post percutaneous coronary intervention stent to the left anterior descending 2004. a. Status post drug-eluting stent in the mid left anterior descending for in-stent restenosis 01/2010. b. On aspirin and Plavix throughout. 3. Left ventricular dysfunction, with no current heart failure.  Ejection fraction 40% to 45%. 4. Sinus bradycardia, stable. 5. Renal insufficiency.  Creatinine stable at 1.4. 6. History of syncope, no recurrence.  PLAN: 1. The patient's symptoms are very reminiscent of his prior symptoms of angina.  I suspect that he has an acute coronary syndrome, although is enzymes are negative and his EKG shows no acute changes, but the patient is currently pain free. 2. I discussed with the patient the options, and he is in agreement to proceed with cardiac catheterization.  This will be done at [facility] later this afternoon.  We will keep the patient n.p.o., and he will be continued on aspirin, Plavix, and heparin in the interim. 3. We will  also start hydration with half-normal saline at 100 mL/hour, given his underlying renal insufficiency. 4. No V-gram during the diagnostic catheterization and minimal dye at that time, so hopefully the patient can proceed with an ad hoc intervention, if needed, today. 5. All the patient's questions were answered, and he is in agreement.  He understands the risks and benefits of a diagnostic cardiac catheterization.   __________________________    Lewayne Bunting, M.D. Alinda Money D: 11/16/2011 1012 T: 11/16/2011 1042 P: DEG  cc:  Meredith Mody, M.D.

## 2011-11-17 ENCOUNTER — Encounter (HOSPITAL_COMMUNITY): Payer: Self-pay | Admitting: Cardiology

## 2011-11-17 DIAGNOSIS — I200 Unstable angina: Secondary | ICD-10-CM

## 2011-11-17 LAB — CBC
MCH: 30.2 pg (ref 26.0–34.0)
MCHC: 36 g/dL (ref 30.0–36.0)
Platelets: 129 10*3/uL — ABNORMAL LOW (ref 150–400)
RBC: 4.7 MIL/uL (ref 4.22–5.81)

## 2011-11-17 LAB — GLUCOSE, CAPILLARY

## 2011-11-17 LAB — BASIC METABOLIC PANEL
CO2: 27 mEq/L (ref 19–32)
Chloride: 102 mEq/L (ref 96–112)
Glucose, Bld: 173 mg/dL — ABNORMAL HIGH (ref 70–99)
Potassium: 4.1 mEq/L (ref 3.5–5.1)
Sodium: 136 mEq/L (ref 135–145)

## 2011-11-17 LAB — LIPID PANEL
Cholesterol: 123 mg/dL (ref 0–200)
HDL: 31 mg/dL — ABNORMAL LOW
LDL Cholesterol: 71 mg/dL (ref 0–99)
Total CHOL/HDL Ratio: 4 ratio
Triglycerides: 106 mg/dL
VLDL: 21 mg/dL (ref 0–40)

## 2011-11-17 MED ORDER — ATROPINE SULFATE 1 MG/ML IJ SOLN: 1.0000 mg | Freq: Once | INTRAMUSCULAR | Status: AC

## 2011-11-17 MED ORDER — ATROPINE SULFATE 0.1 MG/ML IJ SOLN: 1.0000 mg | Freq: Once | INTRAMUSCULAR | Status: DC

## 2011-11-17 MED ORDER — PANTOPRAZOLE SODIUM 40 MG PO TBEC: 40.0000 mg | DELAYED_RELEASE_TABLET | Freq: Every day | ORAL | Status: DC

## 2011-11-17 MED ORDER — ATROPINE SULFATE 1 MG/ML IJ SOLN
INTRAMUSCULAR | Status: AC
  Administered 2011-11-17: 1 mg
  Filled 2011-11-17: qty 1

## 2011-11-17 MED FILL — Heparin Sodium (Porcine) 100 Unt/ML in Sodium Chloride 0.45%: INTRAMUSCULAR | Qty: 250 | Status: AC

## 2011-11-17 MED FILL — Nicardipine HCl IV Soln 2.5 MG/ML: INTRAVENOUS | Qty: 1 | Status: AC

## 2011-11-17 MED FILL — Dextrose Inj 5%: INTRAVENOUS | Qty: 50 | Status: AC

## 2011-11-17 NOTE — Progress Notes (Signed)
Clinical Social Work Department BRIEF PSYCHOSOCIAL ASSESSMENT 11/17/2011  Patient:  Steven Haas, Steven Haas     Account Number:  [id number]     Admit date:  11/16/2011  Clinical Social Worker:  Lourdes Sledge  Date/Time:  11/17/2011 10:41 AM  Referred by:  Physician  Date Referred:  11/16/2011 Referred for  Advanced Directives   Other Referral:   Interview type:  Patient Other interview type:    PSYCHOSOCIAL DATA Living Status:  WITH ADULT CHILDREN Admitted from facility:   Level of care:   Primary support name:  Benjermin Korber Primary support relationship to patient:  CHILD, ADULT Degree of support available:   Pt lost his wife 2 years ago however his son lives with him. Pt reports having family in the area who are very supportive.    CURRENT CONCERNS Current Concerns  Other - See comment   Other Concerns:   Pt reports his wife dying 2 years ago from a heart attach.    SOCIAL WORK ASSESSMENT / PLAN Covering CSW assessed pt as pt wife passed away 2 years ago. CSW visited pt room and introduced her role. Pt very appreciative and shared that his wife died 2 years ago from a sudden heart attach. Pt stated it was definitely unexpected and though he has his "good and bad" days his family is very supportive. Pt stated he never received counseling after his wifes death as he does not feel he needed it or needs it now. CSW informed pt that she could provide pt with resources for counseling however pt declined resources. Pt denied having any other CSW needs however appreciated the visit. Pt will be dc'ing today and picked up by his son who lives with him.   Assessment/plan status:  No Further Intervention Required Other assessment/ plan:   Information/referral to community resources:   CSW has a list of couneling resources however the pt declined needing any resources as his support system is very strong.    PATIENT'S/FAMILY'S RESPONSE TO PLAN OF CARE: Pt was alert, oriented and pleasant  to speak to. The pt was sitting in a chair and stated he was ready to be dc'ed and picked up by his son. The pt stated he still has difficult days however he is thankful he has his family nearby. Pt declines CSW assistance. CSW signing off.        Theresia Bough, MSW, Theresia Majors [phone]

## 2011-11-17 NOTE — Discharge Summary (Signed)
Patient seen and examined and history reviewed. Agree with above findings and plan. See rounding note from earlier.  Theron Arista JordanMD 11/17/2011 1:36 PM   \

## 2011-11-17 NOTE — Discharge Instructions (Signed)
Radial Site Care Refer to this sheet in the next few weeks. These instructions provide you with information on caring for yourself after your procedure. Your caregiver may also give you more specific instructions. Your treatment has been planned according to current medical practices, but problems sometimes occur. Call your caregiver if you have any problems or questions after your procedure. HOME CARE INSTRUCTIONS  You may shower the day after the procedure.Remove the bandage (dressing) and gently wash the site with plain soap and water.Gently pat the site dry.   Do not apply powder or lotion to the site.   Do not submerge the affected site in water for 3 to 5 days.   Inspect the site at least twice daily.   Do not flex or bend the affected arm for 24 hours.   No lifting over 5 pounds (2.3 kg) for 5 days after your procedure.   Do not drive home if you are discharged the same day of the procedure. Have someone else drive you.   You may drive 24 hours after the procedure unless otherwise instructed by your caregiver.   Do not operate machinery or power tools for 24 hours.   A responsible adult should be with you for the first 24 hours after you arrive home.  What to expect:  Any bruising will usually fade within 1 to 2 weeks.   Blood that collects in the tissue (hematoma) may be painful to the touch. It should usually decrease in size and tenderness within 1 to 2 weeks.  SEEK IMMEDIATE MEDICAL CARE IF:  You have unusual pain at the radial site.   You have redness, warmth, swelling, or pain at the radial site.   You have drainage (other than a small amount of blood on the dressing).   You have chills.   You have a fever or persistent symptoms for more than 72 hours.   You have a fever and your symptoms suddenly get worse.   Your arm becomes pale, cool, tingly, or numb.   You have heavy bleeding from the site. Hold pressure on the site.  Document Released: 08/05/2010  Document Revised: 06/22/2011 Document Reviewed: 08/05/2010 Exit[employer] Patient Information 2012 [employer]. 

## 2011-11-17 NOTE — Progress Notes (Signed)
   TELEMETRY: Reviewed telemetry pt in sinus brady with rates in low 40s.[facility] Mons Vitals:   11/17/11 0100 11/17/11 0300 11/17/11 0431 11/17/11 0458  BP: 129/81 97/55 108/61   Pulse: 60 59 55   Temp:   97.8 F (36.6 C)   TempSrc:   Oral   Resp:      Height:      Weight:    98.8 kg (217 lb 13 oz)  SpO2: 98% 96% 96%     Intake/Output Summary (Last 24 hours) at 11/17/11 0741 Last data filed at 11/17/11 0400  Gross per 24 hour  Intake 1123.5 ml  Output   1250 ml  Net -126.5 ml    SUBJECTIVE Feels well. Can tell a difference already with resolution of chest pain.  LABS: Basic Metabolic Panel:  Basename 11/17/11 0435  NA 136  K 4.1  CL 102  CO2 27  GLUCOSE 173*  BUN 14  CREATININE 1.24  CALCIUM 9.0  MG --  PHOS --   Liver Function Tests: No results found for this basename: AST:2,ALT:2,ALKPHOS:2,BILITOT:2,PROT:2,ALBUMIN:2 in the last 72 hours No results found for this basename: LIPASE:2,AMYLASE:2 in the last 72 hours CBC:  Basename 11/17/11 0435  WBC 5.6  NEUTROABS --  HGB 14.2  HCT 39.4  MCV 83.8  PLT 129*   Cardiac Enzymes: No results found for this basename: CKTOTAL:3,CKMB:3,CKMBINDEX:3,TROPONINI:3 in the last 72 hours BNP: No components found with this basename: POCBNP:3 D-Dimer: No results found for this basename: DDIMER:2 in the last 72 hours Hemoglobin A1C: No results found for this basename: HGBA1C in the last 72 hours Fasting Lipid Panel:  Basename 11/17/11 0435  CHOL 123  HDL 31*  LDLCALC 71  TRIG 696  CHOLHDL 4.0  LDLDIRECT --   Thyroid Function Tests: No results found for this basename: TSH,T4TOTAL,FREET3,T3FREE,THYROIDAB in the last 72 hours Anemia Panel: No results found for this basename: VITAMINB12,FOLATE,FERRITIN,TIBC,IRON,RETICCTPCT in the last 72 hours  Radiology/Studies:  No results found.  PHYSICAL EXAM General: Well developed, well nourished, in no acute distress. Head: Normocephalic, atraumatic, sclera non-icteric, no  xanthomas, nares are without discharge. Neck: Negative for carotid bruits. JVD not elevated. Lungs: Clear bilaterally to auscultation without wheezes, rales, or rhonchi. Breathing is unlabored. Heart: RRR S1 S2 without 2/6 systolic murmur.  Abdomen: Soft, non-tender, non-distended with normoactive bowel sounds. No hepatomegaly. No rebound/guarding. No obvious abdominal masses. Msk:  Strength and tone appears normal for age. Extremities: No clubbing, cyanosis or edema.  Distal pedal pulses are 2+ and equal bilaterally. Radial site looks good without hematoma. Neuro: Alert and oriented X 3. Moves all extremities spontaneously. Psych:  Responds to questions appropriately with a normal affect.  ASSESSMENT AND PLAN: 1. USAP. S/p cutting balloon PTCA of LAD for in stent restenosis. 2. Marked sinus bradycardia. Patient states the last time he was hospitalized his HR was slow and metoprolol dose was decreased. No symptoms. 3. Hypercholesterolemia. Controlled on statin. 4. CKD stage 2. Creatinine stable.  Plan: will stop metoprolol. Stop nitrates. Ambulate today with plans to discharge later this am. Continue ASA and plavix. Follow up with Dr. Andee Lineman in 2 weeks.  Principal Problem:  *Unstable angina Active Problems:  HYPERTENSION  CAD  Bradycardia  Renal insufficiency    Signed, Dionel Archey [place] MD,FACC 11/17/2011 7:41 AM

## 2011-11-17 NOTE — Progress Notes (Signed)
Covering Clinical Child psychotherapist (CSW) received a referral for grief counseling as pt lost his wife 2 years ago. CSW assessed pt who will dc today. Pt at this time declined resources for counseling.   A full psychosocial assessment will be included.  Theresia Bough, MSW, Theresia Majors [phone]

## 2011-11-17 NOTE — Discharge Summary (Signed)
Discharge Summary   Patient ID: Steven Haas MRN: [id number], DOB/AGE: Jan 17, 1951 61 y.o.  Primary MD: [name], MD Primary Cardiologist: Dr. Andee Lineman Admit date: 11/16/2011 D/C date:     11/17/2011      Primary Discharge Diagnoses:  1. 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 Sinus Bradycardia  - H/o syncope 2/2 bradycardia on high dose BB in the past  - Symptomatic bradycardia requiring atropine this admission, Metoprolol stopped   Secondary Discharge Diagnoses:  1. LV dysfunction - EF 40-45% with apical akinesis with scar, as well as anterolateral hypokinesis by echo 02/2011 2. HTN 3. HLD - LDL 71, on statin 4. CKD Stage 2 5. DM Type 2 6. GERD 7. Right Shoulder surgery 8. Left Elbow surgery  Allergies No Known Allergies  Diagnostic Studies/Procedures:   11/16/11 - Cardiac Cath Hemodynamics:  AO 83/51  LV 84/9  Coronary angiography:  Coronary dominance: right  Left mainstem: No significant disease.  Left anterior descending (LAD): 80% in-stent restenosis in proximal LAD. 30% ostial D1 and ostial D2 stenosis.  Left circumflex (LCx): Large, dominant LCx. Ramus with 30% ostial stenosis. Luminal irregularities in the remainder of the LCx system.  Right coronary artery (RCA): Small, nondominant RCA with luminal irregularities.  Left ventriculography: Not done due to CKD.  Final Conclusions: 80% in-stent restenosis in proximal LAD. Given unstable angina symptoms, plan intervention. He has been on Plavix.   History of Present Illness: 61 y.o. male w/ the above medical problems who presented to [facility] on 11/16/11 with complains of chest pain and was transferred [facility] for concerns of unstable angina.  He reported intermittent, substernal chest pain typically lasting 10-15 minutes occurring both at rest and with  exertion that was very similar to his chest pain prior to his interventions both in 2004 and 2011. For about a week he noticed weakness and fatigue, which he stated is very unusual for him. The day prior to presentation, while at work and doing training exercise, he developed severe substernal chest pain for which he took 2 sublingual nitroglycerin with mild improvement in his symptoms.  Hospital Course: At [facility] EKG revealed NSR with old anterior wall MI, no acute ST/T changes. CXR showed mild cardiomegaly, no acute cardiopulmonary abnormalities. Cardiac enzymes were negative. There was concern for acute coronary syndrome and he was initiated on heparin drip and transferred to [facility].  He underwent cardiac cath on 11/16/11 revealing 80% in-stent restenosis in prox LAD for which cutting balloon PCI was performed. He tolerated the procedure well without complications. He had an episode of nausea, dizziness, and lightheadedness while ambulating to the bathroom at which time his HR was noted to decrease to 29 on telemetry requiring atropine. His heart rate returned to the 60-70s with resolution of symptoms. His metoprolol was stopped and he had no further episodes of symptomatic bradycardia. He was able to ambulate with cardiac rehab without c/o sob or chest pain.  Medication changes include: Metoprolol and Imdur were stopped. Omeprazole was changed to Pantoprazole due to Plavix interaction. He is on Actos for diabetes management, but with LV systolic dysfunction would consider stopping. Will defer to PCP.  He was seen and evaluated by Dr. Swaziland who felt he was stable for discharge home with plans for follow up as scheduled below.  Discharge Vitals: Blood pressure 122/63, pulse 67, temperature 98 F (36.7 C), temperature source Oral, resp. rate 16, height 6\' 4"  (1.93 m), weight 217 lb 13 oz (98.8 kg), SpO2 99.00%.  Labs: Component Value Date   WBC 5.6 11/17/2011   HGB 14.2 11/17/2011   HCT  39.4 11/17/2011   MCV 83.8 11/17/2011   PLT 129* 11/17/2011    Lab 11/17/11 0435  NA 136  K 4.1  CL 102  CO2 27  BUN 14  CREATININE 1.24  CALCIUM 9.0  GLUCOSE 173*   Component Value Date   CHOL 123 11/17/2011   HDL 31* 11/17/2011   LDLCALC 71 11/17/2011   TRIG 106 11/17/2011   [facility] Labs: Cardiac enzymes negative x3: Troponin 0.01, 0.01, and 0.01, and CK, CK-MB index normal   Discharge Medications   Medication List  As of 11/17/2011  9:31 AM   STOP taking these medications         isosorbide mononitrate 30 MG 24 hr tablet      metoprolol succinate 25 MG 24 hr tablet      omeprazole 20 MG capsule         TAKE these medications         aspirin 81 MG chewable tablet   Chew 81 mg by mouth daily.      clopidogrel 75 MG tablet   Commonly known as: PLAVIX   Take 75 mg by mouth daily.      furosemide 20 MG tablet   Commonly known as: LASIX   Take 1 tablet (20 mg total) by mouth daily.      nitroGLYCERIN 0.4 MG SL tablet   Commonly known as: NITROSTAT   Place 0.4 mg under the tongue every 5 (five) minutes as needed. For chest pain; Not to exceed 3 in 15 minute time frame. For chest pain.      pantoprazole 40 MG tablet   Commonly known as: PROTONIX   Take 1 tablet (40 mg total) by mouth daily at 12 noon.      pioglitazone-glimepiride 30-2 MG per tablet   Commonly known as: DUETACT   Take 1 tablet by mouth daily.      pravastatin 40 MG tablet   Commonly known as: PRAVACHOL   Take 40 mg by mouth at bedtime.      ramipril 10 MG capsule   Commonly known as: ALTACE   Take 1 capsule (10 mg total) by mouth daily.      Tamsulosin HCl 0.4 MG Caps   Commonly known as: FLOMAX   Take 0.4 mg by mouth daily.            Disposition   Discharge Orders    Future Appointments: Provider: Department: Dept Phone: Center:   12/06/2011 1:40 PM Rande Brunt, PA [facility] [phone] [facility]     Future Orders Please Complete By Expires   Diet - low sodium heart  healthy      Increase activity slowly      Discharge instructions      Comments:   **PLEASE REMEMBER TO BRING ALL OF YOUR MEDICATIONS TO EACH OF YOUR FOLLOW-UP OFFICE VISITS.  * KEEP WRIST CATHETERIZATION SITE CLEAN AND DRY. Call the office for any signs of bleeding, pus, swelling, increased pain, or any other concerns. * NO HEAVY LIFTING (>10lbs) OR SEXUAL ACTIVITY X 7 DAYS. * NO DRIVING X 2-3 DAYS. * NO SOAKING BATHS, HOT TUBS, POOLS, ETC., X 7 DAYS.  * Your metoprolol was  stopped this admission due to low heart rates.  * Your omeprazole was changed to pantoprazole due to interactions with plavix.  * You are taking a combination diabetes medication (Duetact) that contains Actos (Pioglitazone). It is recommended you discuss discontinuing Actos with your primary care provider as it can worsen heart failure.     Follow-up Information    Follow up with Gene Serpe, PA on 12/06/2011. (1:40)    Contact information:   [facility] [address], [address] [place] [address] [phone]       Follow up with [name], MD. (Follow up As needed)    Contact information:   [address] [place] [address] [phone]           Outstanding Labs/Studies:  None  Duration of Discharge Encounter: Greater than 30 minutes including physician and PA time.  Signed, Gracieann Stannard PA-C 11/17/2011, 9:31 AM

## 2011-11-17 NOTE — Progress Notes (Signed)
CARDIAC REHAB PHASE I   PRE:  Rate/Rhythm: 56 SB  BP:  Supine: 122/63  Sitting:   Standing:    SaO2: 98 RA  MODE:  Ambulation: 680 ft   POST:  Rate/Rhythem: 61 SR  BP:  Supine:   Sitting: 125/71  Standing:    SaO2:  1191-4782 Tolerated ambulatiion well without c/o of cp or SOB. Gait steady VS stable. Completed discharge education with pt, He declines Outpt. CRP due to long work hours.  Beatrix Fetters

## 2011-11-17 NOTE — Significant Event (Signed)
@   2315 patient called and notified RN of his IV site bleeding. Upon arrival to room, moderate amount of bleeding noted from IV site, IV tubing not completely connected at insertion site. IV removed with cath intact. Patient ambulated to bathroom and put on his pajama bottoms. Upon return from bathroom, patient sitting up in chair, began complaining of not feeling well. Patient complained of nausea, lightheadedness and dizziness. Patient's heart rate decreased to 29, then sustained in the low to mid 30's. Patient assisted back to bed by nursing staff, new PIV inserted, IV fluids resumed, atropine 1 amp administered, 12 lead EKG obtained. Dr Shirlee Latch notified. No further orders received. Patient remained alert during the event, however persisted to have complaints of not feeling well(began feeling better after heart rate increased). Heart rate increased to 60's-70's after administration of atropine; BP increased to 131/74.

## 2011-12-06 ENCOUNTER — Encounter: Payer: Self-pay | Admitting: Physician Assistant

## 2011-12-06 ENCOUNTER — Ambulatory Visit (INDEPENDENT_AMBULATORY_CARE_PROVIDER_SITE_OTHER): Payer: 59 | Admitting: Physician Assistant

## 2011-12-06 VITALS — BP 122/79 | HR 79 | Resp 16 | Ht 76.0 in | Wt 216.0 lb

## 2011-12-06 DIAGNOSIS — I498 Other specified cardiac arrhythmias: Secondary | ICD-10-CM

## 2011-12-06 DIAGNOSIS — I2510 Atherosclerotic heart disease of native coronary artery without angina pectoris: Secondary | ICD-10-CM

## 2011-12-06 DIAGNOSIS — E782 Mixed hyperlipidemia: Secondary | ICD-10-CM | POA: Insufficient documentation

## 2011-12-06 DIAGNOSIS — I10 Essential (primary) hypertension: Secondary | ICD-10-CM

## 2011-12-06 DIAGNOSIS — E785 Hyperlipidemia, unspecified: Secondary | ICD-10-CM

## 2011-12-06 DIAGNOSIS — R001 Bradycardia, unspecified: Secondary | ICD-10-CM

## 2011-12-06 MED ORDER — NITROGLYCERIN 0.4 MG SL SUBL: 0.4000 mg | SUBLINGUAL_TABLET | SUBLINGUAL | Status: DC | PRN

## 2011-12-06 NOTE — Assessment & Plan Note (Signed)
Followed by primary M.D. Well-controlled on current dose Pravachol, with recent LDL 71.

## 2011-12-06 NOTE — Assessment & Plan Note (Signed)
Resolved, following recent discontinuation of beta blocker secondary to profound bradycardia (30 bpm). Patient also has history of syncope secondary to bradycardia, on high-dose beta blocker.

## 2011-12-06 NOTE — Progress Notes (Signed)
HPI: Patient presents for post hospital followup. He was transferred from [facility] to [facility] for evaluation of symptoms worrisome for UAP, with negative cardiac markers. He was placed on IV heparin. Cardiac catheterization yielded 80% ISR in the pLAD, successfully treated with "cutting balloon" PCI. Postop course notable for marked bradycardia (29 bpm), requiring Rx with atropine. Lopressor was stopped. Residual anatomy notable for nonobstructive CAD; LVG deferred, secondary to CKD.  Clinically, patient reports some initial mild chest tightness occurring the first few days following discharge. This has since resolved completely. In fact, he has resumed his previous activities, including mowing the lawn yesterday with a push mower, with no associated CP. He is requesting clearance to return to work next week.  He reports no complications of R wrist incision site.  No Known Allergies  Current Outpatient Prescriptions  Medication Sig Dispense Refill  . aspirin 81 MG chewable tablet Chew 81 mg by mouth daily.      . clopidogrel (PLAVIX) 75 MG tablet Take 75 mg by mouth daily.        . furosemide (LASIX) 20 MG tablet Take 1 tablet (20 mg total) by mouth daily.  30 tablet  6  . nitroGLYCERIN (NITROSTAT) 0.4 MG SL tablet Place 0.4 mg under the tongue every 5 (five) minutes as needed. For chest pain; Not to exceed 3 in 15 minute time frame. For chest pain.      . pantoprazole (PROTONIX) 40 MG tablet Take 1 tablet (40 mg total) by mouth daily at 12 noon.  30 tablet  2  . pioglitazone-glimepiride (DUETACT) 30-2 MG per tablet Take 1 tablet by mouth daily.        . pravastatin (PRAVACHOL) 40 MG tablet Take 40 mg by mouth at bedtime.       . ramipril (ALTACE) 10 MG capsule Take 1 capsule (10 mg total) by mouth daily.  90 capsule  3  . Tamsulosin HCl (FLOMAX) 0.4 MG CAPS Take 0.4 mg by mouth daily.         Past Medical History  Diagnosis Date  . Diabetes mellitus     Type 2  . GERD (gastroesophageal reflux  disease)   . Hypertension   . CAD (coronary artery disease)     Remote MI 2004 s/p LAD PCI in [place], s/p DES to mid LAD for ISR 01/2010; s/p cutting balloon PCI to prox LAD for ISR 11/16/11   . LV dysfunction      40-45% with apical akinesis with scar as well as anterolateral hypokinesis by Dr. Margarita Mail note 02/2011,  was 50% at cath 01/2010  . Sinus bradycardia   . Renal insufficiency     Cr 1.35 as outpatient 03/2011  . Syncope     status post bradycardia on high-dose blocker therapy    Past Surgical History  Procedure Date  . Coronary stent placement     x2  . Shoulder surgery     Right  . Elbow surgery     Left  . Nose surgery     Tumors  . Coronary angioplasty     History   Social History  . Marital Status: Widowed    Spouse Name: N/A    Number of Children: N/A  . Years of Education: N/A   Occupational History  . Not on file.   Social History Main Topics  . Smoking status: Never Smoker   . Smokeless tobacco: Never Used  . Alcohol Use: No  . Drug Use: No  . Sexually  Active: Not on file   Other Topics Concern  . Not on file   Social History Narrative  . No narrative on file   Social History Narrative  . No narrative on file    Problem Relation Age of Onset  . Heart attack Father   . Stroke Father   . Hypertension Sister   . Hypertension Sister   . Hypertension Sister   . Hypertension Sister     ROS: no nausea, vomiting; no fever, chills; no melena, hematochezia; no claudication  PHYSICAL EXAM: BP 122/79  Pulse 79  Resp 16  Ht 6\' 4"  (1.93 m)  Wt 216 lb (97.977 kg)  BMI 26.29 kg/m2 GENERAL: 61 year old male; NAD HEENT: NCAT, PERRLA, EOMI; sclera clear; no xanthelasma NECK: palpable bilateral carotid pulses, no bruits; no JVD; no TM LUNGS: CTA bilaterally CARDIAC: RRR (S1, S2); no significant murmurs; no rubs or gallops ABDOMEN: soft, non-tender; intact BS EXTREMETIES: palpable RP, no hematoma/ecchymosis; no significant peripheral  edema SKIN: warm/dry; no obvious rash/lesions MUSCULOSKELETAL: no joint deformity NEURO: no focal deficit; NL affect   EKG: reviewed and available in Electronic Records   ASSESSMENT & PLAN:  CAD Continue aggressive secondary prevention. Suspect patient will require lifelong DAPT, following recent high-grade ISR. Continue off beta blocker, secondary to recent documented profound bradycardia (approximately 30 bpm) Of note, patient apparently had prior history of syncope, secondary to bradycardia on high-dose beta blocker. Will arrange for followup with Dr. Andee Lineman in 4 months. We'll renew prescription for NTG. Patient has been cleared to return to work, without restriction, on 12/11/2011.  HLD (hyperlipidemia) Followed by primary M.D. Well-controlled on current dose Pravachol, with recent LDL 71.  HYPERTENSION Well-controlled on current medication regimen  Bradycardia Resolved, following recent discontinuation of beta blocker secondary to profound bradycardia (30 bpm). Patient also has history of syncope secondary to bradycardia, on high-dose beta blocker.     Gene Carlo Lorson, PAC

## 2011-12-06 NOTE — Assessment & Plan Note (Addendum)
Continue aggressive secondary prevention. Suspect patient will require lifelong DAPT, following recent high-grade ISR. Continue off beta blocker, secondary to recent documented profound bradycardia (approximately 30 bpm) Of note, patient apparently had prior history of syncope, secondary to bradycardia on high-dose beta blocker. Will arrange for followup with Dr. Andee Lineman in 4 months. We'll renew prescription for NTG. Patient has been cleared to return to work, without restriction, on 12/11/2011.

## 2011-12-06 NOTE — Patient Instructions (Signed)
   Nitroglycerin refill sent to pharmacy. Continue all current medications.  Follow up in  4 months.

## 2011-12-06 NOTE — Assessment & Plan Note (Signed)
Well-controlled on current medication regimen 

## 2011-12-10 ENCOUNTER — Other Ambulatory Visit: Payer: Self-pay | Admitting: Cardiology

## 2012-01-02 ENCOUNTER — Other Ambulatory Visit: Payer: Self-pay | Admitting: *Deleted

## 2012-01-02 DIAGNOSIS — I200 Unstable angina: Secondary | ICD-10-CM

## 2012-01-02 MED ORDER — PANTOPRAZOLE SODIUM 40 MG PO TBEC: 40.0000 mg | DELAYED_RELEASE_TABLET | Freq: Every day | ORAL | Status: DC

## 2012-04-12 ENCOUNTER — Ambulatory Visit: Payer: 59 | Admitting: Cardiology

## 2012-04-18 ENCOUNTER — Ambulatory Visit (INDEPENDENT_AMBULATORY_CARE_PROVIDER_SITE_OTHER): Payer: 59 | Admitting: Physician Assistant

## 2012-04-18 VITALS — BP 118/78 | HR 58 | Ht 76.0 in | Wt 214.0 lb

## 2012-04-18 DIAGNOSIS — R001 Bradycardia, unspecified: Secondary | ICD-10-CM

## 2012-04-18 DIAGNOSIS — I2510 Atherosclerotic heart disease of native coronary artery without angina pectoris: Secondary | ICD-10-CM

## 2012-04-18 DIAGNOSIS — E785 Hyperlipidemia, unspecified: Secondary | ICD-10-CM

## 2012-04-18 DIAGNOSIS — I498 Other specified cardiac arrhythmias: Secondary | ICD-10-CM

## 2012-04-18 MED ORDER — ISOSORBIDE MONONITRATE ER 30 MG PO TB24: 15.0000 mg | ORAL_TABLET | Freq: Every day | ORAL | Status: DC

## 2012-04-18 NOTE — Assessment & Plan Note (Signed)
Followed by primary M.D. 

## 2012-04-18 NOTE — Assessment & Plan Note (Signed)
Stable off all rate controlling agents. Patient has history of profound bradycardia (30 bpm) and syncope, on beta blocker.

## 2012-04-18 NOTE — Progress Notes (Signed)
Primary Cardiologist: Lewayne Bunting, MD   HPI: Patient presents for scheduled followup.  Since his last visit here in May 2013, he had been doing well until approximately 2 weeks ago. He has had occasional CP, typically at rest, but which has resolved with 1 NTG tablet after approximately 20 minutes. He has not had any CP in over a week. He attributes this to "stress", which he has been experiencing occasionally at work. However, he also suggests some similarity to his previous presentations.  No Known Allergies  Current Outpatient Prescriptions  Medication Sig Dispense Refill  . aspirin 81 MG chewable tablet Chew 81 mg by mouth daily.      . clopidogrel (PLAVIX) 75 MG tablet Take 75 mg by mouth daily.        . furosemide (LASIX) 20 MG tablet TAKE 1 TABLET BY MOUTH EVERY DAY AS NEEDED  30 tablet  6  . nitroGLYCERIN (NITROSTAT) 0.4 MG SL tablet Place 1 tablet (0.4 mg total) under the tongue every 5 (five) minutes as needed. For chest pain; Not to exceed 3 in 15 minute time frame. For chest pain.  25 tablet  3  . pantoprazole (PROTONIX) 40 MG tablet Take 1 tablet (40 mg total) by mouth daily at 12 noon.  90 tablet  2  . pioglitazone-glimepiride (DUETACT) 30-2 MG per tablet Take 1 tablet by mouth daily.        . pravastatin (PRAVACHOL) 40 MG tablet Take 40 mg by mouth at bedtime.       . ramipril (ALTACE) 10 MG capsule TAKE ONE CAPSULE BY MOUTH EVERY DAY  90 capsule  3  . Tamsulosin HCl (FLOMAX) 0.4 MG CAPS Take 0.4 mg by mouth daily.       . isosorbide mononitrate (IMDUR) 30 MG 24 hr tablet Take 0.5 tablets (15 mg total) by mouth daily.  15 tablet  6    Past Medical History  Diagnosis Date  . Diabetes mellitus     Type 2  . GERD (gastroesophageal reflux disease)   . Hypertension   . CAD (coronary artery disease)     Remote MI 2004 s/p LAD PCI in [place], s/p DES to mid LAD for ISR 01/2010; s/p cutting balloon PCI to prox LAD for ISR 11/16/11   . LV dysfunction      40-45% with apical  akinesis with scar as well as anterolateral hypokinesis by Dr. Margarita Mail note 02/2011,  was 50% at cath 01/2010  . Sinus bradycardia   . Renal insufficiency     Cr 1.35 as outpatient 03/2011  . Syncope     status post bradycardia on high-dose blocker therapy    Past Surgical History  Procedure Date  . Coronary stent placement     x2  . Shoulder surgery     Right  . Elbow surgery     Left  . Nose surgery     Tumors  . Coronary angioplasty     History   Social History  . Marital Status: Widowed    Spouse Name: N/A    Number of Children: N/A  . Years of Education: N/A   Occupational History  . Not on file.   Social History Main Topics  . Smoking status: Never Smoker   . Smokeless tobacco: Never Used  . Alcohol Use: No  . Drug Use: No  . Sexually Active: Not on file   Other Topics Concern  . Not on file   Social History Narrative  . No  narrative on file    Family History  Problem Relation Age of Onset  . Heart attack Father   . Stroke Father   . Hypertension Sister   . Hypertension Sister   . Hypertension Sister   . Hypertension Sister     ROS: no nausea, vomiting; no fever, chills; no melena, hematochezia; no claudication  PHYSICAL EXAM: BP 118/78  Pulse 58  Ht 6\' 4"  (1.93 m)  Wt 214 lb (97.07 kg)  BMI 26.05 kg/m2 GENERAL: 61 year old male; NAD  HEENT: NCAT, PERRLA, EOMI; sclera clear; no xanthelasma  NECK: palpable bilateral carotid pulses, no bruits; no JVD; no TM  LUNGS: CTA bilaterally  CARDIAC: RRR (S1, S2); no significant murmurs; no rubs or gallops  ABDOMEN: soft, non-tender; intact BS  EXTREMETIES: no significant peripheral edema  SKIN: warm/dry; no obvious rash/lesions  MUSCULOSKELETAL: no joint deformity  NEURO: no focal deficit; NL affect    EKG:    ASSESSMENT & PLAN:  CAD Will initiate long-acting nitrate therapy with Imdur 15 mg daily. We are starting at this lower dose, given reported HA with the NTG tablets. We'll arrange  early clinic followup with me for reassessment. We did discuss proceeding with either a stress test or a cardiac catheterization, if his symptoms do not resolve.  Bradycardia Stable off all rate controlling agents. Patient has history of profound bradycardia (30 bpm) and syncope, on beta blocker.  HLD (hyperlipidemia) Followed by primary M.D.    Gene Pj Zehner, PAC

## 2012-04-18 NOTE — Assessment & Plan Note (Signed)
Will initiate long-acting nitrate therapy with Imdur 15 mg daily. We are starting at this lower dose, given reported HA with the NTG tablets. We'll arrange early clinic followup with me for reassessment. We did discuss proceeding with either a stress test or a cardiac catheterization, if his symptoms do not resolve.

## 2012-04-18 NOTE — Patient Instructions (Signed)
   Begin Imdur 15mg  daily Continue all other current medications. Follow up in  2 weeks

## 2012-05-01 ENCOUNTER — Ambulatory Visit (INDEPENDENT_AMBULATORY_CARE_PROVIDER_SITE_OTHER): Payer: 59 | Admitting: Physician Assistant

## 2012-05-01 ENCOUNTER — Encounter: Payer: Self-pay | Admitting: Physician Assistant

## 2012-05-01 ENCOUNTER — Encounter: Payer: Self-pay | Admitting: *Deleted

## 2012-05-01 VITALS — BP 127/82 | HR 63 | Ht 76.0 in | Wt 217.1 lb

## 2012-05-01 DIAGNOSIS — I498 Other specified cardiac arrhythmias: Secondary | ICD-10-CM

## 2012-05-01 DIAGNOSIS — R001 Bradycardia, unspecified: Secondary | ICD-10-CM

## 2012-05-01 DIAGNOSIS — I2510 Atherosclerotic heart disease of native coronary artery without angina pectoris: Secondary | ICD-10-CM

## 2012-05-01 DIAGNOSIS — E785 Hyperlipidemia, unspecified: Secondary | ICD-10-CM

## 2012-05-01 DIAGNOSIS — I10 Essential (primary) hypertension: Secondary | ICD-10-CM

## 2012-05-01 NOTE — Assessment & Plan Note (Signed)
Followed by primary M.D. 

## 2012-05-01 NOTE — Assessment & Plan Note (Addendum)
Patient has history of profound bradycardia (30 bpm) and syncope. He remains off any rate controlling medications. I also reassured him that Imdur does not slow the HR, contrary to what he had been led to believe in the past.

## 2012-05-01 NOTE — Progress Notes (Signed)
Primary Cardiologist:  HPI: Patient returns for early scheduled followup.  When last seen on October 3, I placed him on Imdur 15 mg daily, for treatment of typical/atypical CP. I chose this low dose, given his history of HA with NTG tablets. He reports today that it lowered his BP slightly, as well as his HR. It did not decrease the episodes of CP. He stopped it altogether at the beginning of this week, and noted no significant change in CP, but did feel better off it.   No Known Allergies  Current Outpatient Prescriptions  Medication Sig Dispense Refill  . aspirin 81 MG chewable tablet Chew 81 mg by mouth daily.      . clopidogrel (PLAVIX) 75 MG tablet Take 75 mg by mouth daily.        . furosemide (LASIX) 20 MG tablet       . pantoprazole (PROTONIX) 40 MG tablet Take 1 tablet (40 mg total) by mouth daily at 12 noon.  90 tablet  2  . pioglitazone-glimepiride (DUETACT) 30-2 MG per tablet Take 1 tablet by mouth daily.        . pravastatin (PRAVACHOL) 40 MG tablet Take 40 mg by mouth at bedtime.       . ramipril (ALTACE) 10 MG capsule TAKE ONE CAPSULE BY MOUTH EVERY DAY  90 capsule  3  . Tamsulosin HCl (FLOMAX) 0.4 MG CAPS Take 0.4 mg by mouth daily.       Marland Kitchen DISCONTD: furosemide (LASIX) 20 MG tablet TAKE 1 TABLET BY MOUTH EVERY DAY AS NEEDED  30 tablet  6  . nitroGLYCERIN (NITROSTAT) 0.4 MG SL tablet Place 1 tablet (0.4 mg total) under the tongue every 5 (five) minutes as needed. For chest pain; Not to exceed 3 in 15 minute time frame. For chest pain.  25 tablet  3    Past Medical History  Diagnosis Date  . Diabetes mellitus     Type 2  . GERD (gastroesophageal reflux disease)   . Hypertension   . CAD (coronary artery disease)     Remote MI 2004 s/p LAD PCI in [place], s/p DES to mid LAD for ISR 01/2010; s/p cutting balloon PCI to prox LAD for ISR 11/16/11   . LV dysfunction      40-45% with apical akinesis with scar as well as anterolateral hypokinesis by Dr. Margarita Mail note 02/2011,  was  50% at cath 01/2010  . Sinus bradycardia   . Renal insufficiency     Cr 1.35 as outpatient 03/2011  . Syncope     status post bradycardia on high-dose blocker therapy    Past Surgical History  Procedure Date  . Coronary stent placement     x2  . Shoulder surgery     Right  . Elbow surgery     Left  . Nose surgery     Tumors  . Coronary angioplasty     History   Social History  . Marital Status: Widowed    Spouse Name: N/A    Number of Children: N/A  . Years of Education: N/A   Occupational History  . Not on file.   Social History Main Topics  . Smoking status: Never Smoker   . Smokeless tobacco: Never Used  . Alcohol Use: No  . Drug Use: No  . Sexually Active: Not on file   Other Topics Concern  . Not on file   Social History Narrative  . No narrative on file    Family  History  Problem Relation Age of Onset  . Heart attack Father   . Stroke Father   . Hypertension Sister   . Hypertension Sister   . Hypertension Sister   . Hypertension Sister     ROS: no nausea, vomiting; no fever, chills; no melena, hematochezia; no claudication  PHYSICAL EXAM: BP 127/82  Pulse 63  Ht 6\' 4"  (1.93 m)  Wt 217 lb 1.9 oz (98.485 kg)  BMI 26.43 kg/m2  SpO2 98% GENERAL: 61 year old male; NAD HEENT: NCAT, PERRLA, EOMI; sclera clear; no xanthelasma NECK: palpable bilateral carotid pulses, no bruits; no JVD; no TM LUNGS: CTA bilaterally CARDIAC: RRR (S1, S2); no significant murmurs; no rubs or gallops ABDOMEN: soft, non-tender; intact BS EXTREMETIES: intact distal pulses; no significant peripheral edema SKIN: warm/dry; no obvious rash/lesions MUSCULOSKELETAL: no joint deformity NEURO: no focal deficit; NL affect   EKG: reviewed and available in Electronic Records   ASSESSMENT & PLAN:  CAD He has agreed to undergo a GXT Cardiolite for further evaluation of his typical/atypical symptoms. This has been ongoing for the last 1-2 months, and is somewhat reminiscent of  his symptoms back in May of this year, when he underwent "cutting balloon" PCI of the pLAD, secondary to ISR. I also advised him to not resume Imdur, in the absence of any palpable benefit, and given that he did experience some associated mild HA. I will plan on seeing him back in 2 weeks for review of stress test results and further recommendations. If this does suggest ischemia, then plan is to proceed with diagnostic coronary angiography.  Bradycardia Patient has history of profound bradycardia (30 bpm) and syncope. He remains off any rate controlling medications. I also reassured him that Imdur does not slow the HR, contrary to what he had been led to believe in the past.  HLD (hyperlipidemia) Followed by primary M.D.  HYPERTENSION Well-controlled on current medication regimen    Gene Ezmae Speers, PAC

## 2012-05-01 NOTE — Patient Instructions (Signed)
   GXT Cardiolite (walking stress test)  Office will contact with results Continue all current medications. Follow up in  2 weeks

## 2012-05-01 NOTE — Assessment & Plan Note (Signed)
He has agreed to undergo a GXT Cardiolite for further evaluation of his typical/atypical symptoms. This has been ongoing for the last 1-2 months, and is somewhat reminiscent of his symptoms back in May of this year, when he underwent "cutting balloon" PCI of the pLAD, secondary to ISR. I also advised him to not resume Imdur, in the absence of any palpable benefit, and given that he did experience some associated mild HA. I will plan on seeing him back in 2 weeks for review of stress test results and further recommendations. If this does suggest ischemia, then plan is to proceed with diagnostic coronary angiography.

## 2012-05-01 NOTE — Assessment & Plan Note (Signed)
Well-controlled on current medication regimen 

## 2012-05-02 ENCOUNTER — Telehealth: Payer: Self-pay

## 2012-05-02 ENCOUNTER — Other Ambulatory Visit: Payer: Self-pay | Admitting: Physician Assistant

## 2012-05-02 DIAGNOSIS — I2510 Atherosclerotic heart disease of native coronary artery without angina pectoris: Secondary | ICD-10-CM

## 2012-05-02 NOTE — Telephone Encounter (Signed)
GXT Cardiolite (walking stress test) Weight 217  Diagnosis: 414.01, 414.00, 427.89  Monday, May 06, 2012

## 2012-05-02 NOTE — Telephone Encounter (Signed)
Auth # [id number] exp 06/16/12

## 2012-05-06 DIAGNOSIS — I2510 Atherosclerotic heart disease of native coronary artery without angina pectoris: Secondary | ICD-10-CM

## 2012-05-08 ENCOUNTER — Telehealth: Payer: Self-pay | Admitting: *Deleted

## 2012-05-08 NOTE — Telephone Encounter (Signed)
Notes Recorded by Lesle Chris, LPN on 78/46/9629 at 9:59 AM Patient notified. Already has follow up scheduled for 10/30 with Gene.

## 2012-05-08 NOTE — Telephone Encounter (Signed)
Message copied by Lesle Chris on Wed May 08, 2012 10:00 AM ------      Message from: Prescott Parma C      Created: Wed May 08, 2012  8:36 AM       Moderately large apical scar, but no definite ischemia; EF 45%. Pt had no CP during GXT. Continue current medication regimen, and will review results at f/u OV.

## 2012-05-15 ENCOUNTER — Ambulatory Visit (INDEPENDENT_AMBULATORY_CARE_PROVIDER_SITE_OTHER): Payer: 59 | Admitting: Physician Assistant

## 2012-05-15 VITALS — BP 146/90 | HR 66 | Ht 76.0 in | Wt 218.1 lb

## 2012-05-15 DIAGNOSIS — I2510 Atherosclerotic heart disease of native coronary artery without angina pectoris: Secondary | ICD-10-CM

## 2012-05-15 DIAGNOSIS — N289 Disorder of kidney and ureter, unspecified: Secondary | ICD-10-CM

## 2012-05-15 DIAGNOSIS — I10 Essential (primary) hypertension: Secondary | ICD-10-CM

## 2012-05-15 DIAGNOSIS — E785 Hyperlipidemia, unspecified: Secondary | ICD-10-CM

## 2012-05-15 DIAGNOSIS — E119 Type 2 diabetes mellitus without complications: Secondary | ICD-10-CM

## 2012-05-15 MED ORDER — RAMIPRIL 10 MG PO CAPS: 20.0000 mg | ORAL_CAPSULE | Freq: Every day | ORAL | Status: DC

## 2012-05-15 NOTE — Assessment & Plan Note (Signed)
Followed by primary M.D. Aggressive management recommended with target LDL 70 or less, if feasible. 

## 2012-05-15 NOTE — Progress Notes (Signed)
Primary Cardiologist: Marca Ancona, MD (new)  HPI: Patient returns for early scheduled followup and review of recent exercise stress Cardiolite test, to rule out significant CAD progression. As previously noted, patient presented with typical/atypical symptoms, ongoing for approximately 1-2 months. He was intolerant of low-dose Imdur, which have been previously added.   - GXT Cardiolite (85% PMHR): Moderately large scar, with severe apical and anteroseptal wall HK; no definite ischemia; EF 45%.  Results of study were reviewed with the patient today. Clinically, he continues to deny any frank exertional CP, and did not have any during recent stress test. He has some mild DOE when walking up flights of stairs. As previously outlined, his CP is intermittent and occurs at rest, and fleeting in duration. He does not use any NTG tablets, since last OV.  No Known Allergies  Current Outpatient Prescriptions  Medication Sig Dispense Refill  . aspirin 81 MG chewable tablet Chew 81 mg by mouth daily.      . furosemide (LASIX) 20 MG tablet Take 20 mg by mouth daily.       . nitroGLYCERIN (NITROSTAT) 0.4 MG SL tablet Place 1 tablet (0.4 mg total) under the tongue every 5 (five) minutes as needed. For chest pain; Not to exceed 3 in 15 minute time frame. For chest pain.  25 tablet  3  . pantoprazole (PROTONIX) 40 MG tablet Take 1 tablet (40 mg total) by mouth daily at 12 noon.  90 tablet  2  . pioglitazone-glimepiride (DUETACT) 30-2 MG per tablet Take 1 tablet by mouth daily.        . pravastatin (PRAVACHOL) 40 MG tablet Take 40 mg by mouth at bedtime.       . ramipril (ALTACE) 10 MG capsule TAKE ONE CAPSULE BY MOUTH EVERY DAY  90 capsule  3  . Tamsulosin HCl (FLOMAX) 0.4 MG CAPS Take 0.4 mg by mouth daily.       . clopidogrel (PLAVIX) 75 MG tablet Take 75 mg by mouth daily.          Past Medical History  Diagnosis Date  . Diabetes mellitus     Type 2  . GERD (gastroesophageal reflux disease)   .  Hypertension   . CAD (coronary artery disease)     Remote MI 2004 s/p LAD PCI in [place], s/p DES to mid LAD for ISR 01/2010; s/p cutting balloon PCI to prox LAD for ISR 11/16/11   . LV dysfunction      40-45% with apical akinesis with scar as well as anterolateral hypokinesis by Dr. Margarita Mail note 02/2011,  was 50% at cath 01/2010  . Sinus bradycardia   . Renal insufficiency     Cr 1.35 as outpatient 03/2011  . Syncope     status post bradycardia on high-dose blocker therapy    Past Surgical History  Procedure Date  . Coronary stent placement     x2  . Shoulder surgery     Right  . Elbow surgery     Left  . Nose surgery     Tumors  . Coronary angioplasty     History   Social History  . Marital Status: Widowed    Spouse Name: N/A    Number of Children: N/A  . Years of Education: N/A   Occupational History  . Not on file.   Social History Main Topics  . Smoking status: Never Smoker   . Smokeless tobacco: Never Used  . Alcohol Use: No  . Drug Use:  No  . Sexually Active: Not on file   Other Topics Concern  . Not on file   Social History Narrative  . No narrative on file    Family History  Problem Relation Age of Onset  . Heart attack Father   . Stroke Father   . Hypertension Sister   . Hypertension Sister   . Hypertension Sister   . Hypertension Sister     ROS: no nausea, vomiting; no fever, chills; no melena, hematochezia; no claudication  PHYSICAL EXAM: BP 146/90  Pulse 66  Ht 6\' 4"  (1.93 m)  Wt 218 lb 1.9 oz (98.939 kg)  BMI 26.55 kg/m2  SpO2 97% GENERAL: 61 year old male; NAD  HEENT: NCAT, PERRLA, EOMI; sclera clear; no xanthelasma  NECK: palpable bilateral carotid pulses, no bruits; no JVD; no TM  LUNGS: CTA bilaterally  CARDIAC: RRR (S1, S2); no significant murmurs; no rubs or gallops  ABDOMEN: soft, non-tender; intact BS  EXTREMETIES: intact distal pulses; no significant peripheral edema  SKIN: warm/dry; no obvious rash/lesions    MUSCULOSKELETAL: no joint deformity  NEURO: no focal deficit; NL affect    EKG:    ASSESSMENT & PLAN:  CAD No further workup is indicated. Results of recent stress test were reviewed with the patient, which yielded no definite evidence of ischemia with evidence of prior infarct. Patient continues to report no frank exertional CP, and also did not experience any while on the treadmill. Therefore, continued aggressive medical management is recommended. Will reassess clinical status in 4 months, at which time he will establish with Dr. Shirlee Latch, here in our [place] clinic.  HYPERTENSION Altace to be increased to 20 mg daily, for more aggressive management of HTN. Followup BMET in 1 week.  Renal insufficiency We'll monitor closely with followup labs in one week, following up titration of ACE inhibitor.  HLD (hyperlipidemia) Followed by primary M.D. Aggressive management recommended with target LDL 70 or less, if feasible.  DM Followed by primary M.D.    Gene Priseis Cratty, PAC

## 2012-05-15 NOTE — Assessment & Plan Note (Signed)
Followed by primary M.D. 

## 2012-05-15 NOTE — Assessment & Plan Note (Signed)
No further workup is indicated. Results of recent stress test were reviewed with the patient, which yielded no definite evidence of ischemia with evidence of prior infarct. Patient continues to report no frank exertional CP, and also did not experience any while on the treadmill. Therefore, continued aggressive medical management is recommended. Will reassess clinical status in 4 months, at which time he will establish with Dr. Shirlee Latch, here in our [place] clinic.

## 2012-05-15 NOTE — Assessment & Plan Note (Signed)
Altace to be increased to 20 mg daily, for more aggressive management of HTN. Followup BMET in 1 week.

## 2012-05-15 NOTE — Patient Instructions (Addendum)
   Increase Altace to 20mg  daily  Lab - BMET - due in one week (around November 6)  Office will contact with results Continue all other current medications.  Follow up in  4 months

## 2012-05-15 NOTE — Assessment & Plan Note (Signed)
We'll monitor closely with followup labs in one week, following up titration of ACE inhibitor.

## 2012-05-22 ENCOUNTER — Encounter: Payer: Self-pay | Admitting: *Deleted

## 2012-05-29 ENCOUNTER — Telehealth: Payer: Self-pay | Admitting: *Deleted

## 2012-05-29 NOTE — Telephone Encounter (Signed)
Needs to have injection in neck - wants to know if okay to hold Plavix x 10 days prior to injection.  Dr. Norberto Sorenson in [place].

## 2012-05-30 NOTE — Telephone Encounter (Signed)
Patient notified

## 2012-05-30 NOTE — Telephone Encounter (Signed)
Holding Plavix for 10 days is too long. Would not hold for more than 5 days, and would prefer to keep on ASA 81 daily during this timeframe, if ok with surgery.

## 2012-07-28 ENCOUNTER — Other Ambulatory Visit: Payer: Self-pay | Admitting: Cardiology

## 2012-07-29 ENCOUNTER — Other Ambulatory Visit: Payer: Self-pay | Admitting: Cardiology

## 2012-07-29 MED ORDER — FUROSEMIDE 20 MG PO TABS: 20.0000 mg | ORAL_TABLET | Freq: Every day | ORAL | Status: DC

## 2012-09-13 ENCOUNTER — Encounter: Payer: Self-pay | Admitting: Cardiology

## 2012-09-13 ENCOUNTER — Ambulatory Visit (INDEPENDENT_AMBULATORY_CARE_PROVIDER_SITE_OTHER): Payer: 59 | Admitting: Cardiology

## 2012-09-13 VITALS — BP 150/88 | HR 65 | Ht 76.0 in | Wt 211.8 lb

## 2012-09-13 DIAGNOSIS — I2510 Atherosclerotic heart disease of native coronary artery without angina pectoris: Secondary | ICD-10-CM

## 2012-09-13 DIAGNOSIS — I10 Essential (primary) hypertension: Secondary | ICD-10-CM

## 2012-09-13 DIAGNOSIS — E782 Mixed hyperlipidemia: Secondary | ICD-10-CM

## 2012-09-13 MED ORDER — FUROSEMIDE 20 MG PO TABS: 20.0000 mg | ORAL_TABLET | Freq: Every day | ORAL | Status: DC

## 2012-09-13 NOTE — Progress Notes (Signed)
Clinical Summary [name] is a 62 y.o.male presenting for followup. This is my first meeting with him. He is a former patient of Dr. Andee Lineman, last seen by [name] in October 2013.   Exercise Cardiolite from October 2013 did not show any diagnostic ST segment changes. There was evidence of scar involving the entire apical, mid anterior, and anteroseptal wall, no definite ischemia, LVEF 45%. He reports no angina symptoms, stable NYHA class II dyspnea.  Lab work from November 2013 revealed BUN 24, creatinine 1.3, sodium 138, potassium 4.9. Lipids are followed by Dr. Chestine Spore. Patient reports compliance with his medications.  He states that he may be getting a lumbar injection and asked about holding his Plavix. At this point he should be able to temporarily hold Plavix - last intervention cutting balloon to the proximal LAD in May 2013, DES to LAD was in 2011.  No Known Allergies  Current Outpatient Prescriptions  Medication Sig Dispense Refill  . aspirin 81 MG chewable tablet Chew 81 mg by mouth daily.      . clopidogrel (PLAVIX) 75 MG tablet Take 75 mg by mouth daily.        . furosemide (LASIX) 20 MG tablet Take 1 tablet (20 mg total) by mouth daily.  90 tablet  3  . nitroGLYCERIN (NITROSTAT) 0.4 MG SL tablet Place 1 tablet (0.4 mg total) under the tongue every 5 (five) minutes as needed. For chest pain; Not to exceed 3 in 15 minute time frame. For chest pain.  25 tablet  3  . pantoprazole (PROTONIX) 40 MG tablet Take 1 tablet (40 mg total) by mouth daily at 12 noon.  90 tablet  2  . pioglitazone-glimepiride (DUETACT) 30-2 MG per tablet Take 1 tablet by mouth daily.        . pravastatin (PRAVACHOL) 40 MG tablet Take 40 mg by mouth at bedtime.       . ramipril (ALTACE) 10 MG capsule Take 2 capsules (20 mg total) by mouth daily.  180 capsule  3  . Tamsulosin HCl (FLOMAX) 0.4 MG CAPS Take 0.4 mg by mouth daily.        No current facility-administered medications for this visit.    Past  Medical History  Diagnosis Date  . Type 2 diabetes mellitus   . GERD (gastroesophageal reflux disease)   . Essential hypertension, benign   . Coronary atherosclerosis of native coronary artery     Remote MI 2004 s/p LAD PCI in [place], s/p DES to mid LAD for ISR 01/2010; s/p cutting balloon PCI to prox LAD for ISR 11/16/11   . Ischemic cardiomyopathy     LVEF 40-45% with apical akinesis with scar as well as anterolateral hypokinesis by Dr. Margarita Mail note 02/2011,  was 50% at cath 01/2010  . Sinus bradycardia   . Renal insufficiency     Cr 1.35 as outpatient 03/2011  . Syncope     Status post bradycardia on high-dose blocker therapy    Social History [name] He has never used smokeless tobacco. [name] reports that he does not drink alcohol.  Review of Systems No bleeding problems. No palpitations or syncope. No claudication. Otherwise negative.  Physical Examination Filed Vitals:   09/13/12 1031  BP: 150/88  Pulse: 65   Filed Weights   09/13/12 1031  Weight: 211 lb 12.8 oz (96.072 kg)   No acute distress. HEENT: Conjunctiva and lids normal, oropharynx clear. Neck: Supple, no elevated JVP  or carotid bruits, no thyromegaly. Lungs: Clear to auscultation, nonlabored breathing at rest. Cardiac: Regular rate and rhythm, no S3 or significant systolic murmur. Abdomen: Soft, nontender, bowel sounds present. Extremities: No pitting edema, distal pulses 2+. Skin: Warm and dry. Musculoskeletal: No kyphosis. Neuropsychiatric: Alert and oriented x3, affect grossly appropriate.   Problem List and Plan   Coronary atherosclerosis of native coronary artery Symptomatically stable on medical therapy. No changes made today. If he does end up needing a lumbar injection, should be OK to temporarily hold Plavix. Office followup arranged.  Essential hypertension, benign Blood pressure elevated today, patient states it is usually better when he checks it.  Medical regimen looks reasonable. Keep followup with Dr. Willaim Bane.  Mixed hyperlipidemia Continues on statin therapy. Followed by Dr. Willaim Bane. Goal LDL should be under 100, close to 70 if possible.    Jonelle Sidle, M.D., F.A.C.C.

## 2012-09-13 NOTE — Assessment & Plan Note (Signed)
Continues on statin therapy. Followed by Dr. Willaim Bane. Goal LDL should be under 100, close to 70 if possible.

## 2012-09-13 NOTE — Assessment & Plan Note (Signed)
Symptomatically stable on medical therapy. No changes made today. If he does end up needing a lumbar injection, should be OK to temporarily hold Plavix. Office followup arranged.

## 2012-09-13 NOTE — Assessment & Plan Note (Signed)
Blood pressure elevated today, patient states it is usually better when he checks it. Medical regimen looks reasonable. Keep followup with Dr. Willaim Bane.

## 2012-09-13 NOTE — Patient Instructions (Addendum)

## 2012-10-13 ENCOUNTER — Other Ambulatory Visit: Payer: Self-pay | Admitting: Cardiology

## 2012-10-15 ENCOUNTER — Other Ambulatory Visit: Payer: Self-pay | Admitting: Cardiology

## 2013-03-12 ENCOUNTER — Encounter: Payer: Self-pay | Admitting: Cardiology

## 2013-03-12 ENCOUNTER — Ambulatory Visit (INDEPENDENT_AMBULATORY_CARE_PROVIDER_SITE_OTHER): Payer: 59 | Admitting: Cardiology

## 2013-03-12 VITALS — BP 136/87 | HR 54 | Ht 76.0 in | Wt 214.0 lb

## 2013-03-12 DIAGNOSIS — I10 Essential (primary) hypertension: Secondary | ICD-10-CM

## 2013-03-12 DIAGNOSIS — I498 Other specified cardiac arrhythmias: Secondary | ICD-10-CM

## 2013-03-12 DIAGNOSIS — I2510 Atherosclerotic heart disease of native coronary artery without angina pectoris: Secondary | ICD-10-CM

## 2013-03-12 DIAGNOSIS — E782 Mixed hyperlipidemia: Secondary | ICD-10-CM

## 2013-03-12 NOTE — Patient Instructions (Addendum)

## 2013-03-12 NOTE — Progress Notes (Signed)
Clinical Summary Steven Haas is a 62 y.o.male last seen in February. From a cardiac perspective he has been doing well, no angina symptoms, NYHA class 1-2 dyspnea. He reports compliance with his medications, no bleeding problems.  ECG today shows sinus bradycardia with evidence of prior anteroseptal infarct, IVCD.  Exercise Cardiolite from October 2013 did not show any diagnostic ST segment changes. There was evidence of scar involving the entire apical, mid anterior, and anteroseptal wall, no definite ischemia, LVEF 45%.  Lipids and diabetes are followed by Dr. Willaim Bane.   No Known Allergies  Current Outpatient Prescriptions  Medication Sig Dispense Refill  . aspirin 81 MG chewable tablet Chew 81 mg by mouth daily.      . clopidogrel (PLAVIX) 75 MG tablet Take 75 mg by mouth daily.        . furosemide (LASIX) 20 MG tablet Take 1 tablet (20 mg total) by mouth daily.  90 tablet  3  . nitroGLYCERIN (NITROSTAT) 0.4 MG SL tablet Place 1 tablet (0.4 mg total) under the tongue every 5 (five) minutes as needed. For chest pain; Not to exceed 3 in 15 minute time frame. For chest pain.  25 tablet  3  . pantoprazole (PROTONIX) 40 MG tablet TAKE 1 TABLET (40 MG TOTAL) BY MOUTH DAILY AT 12 NOON.  90 tablet  3  . pioglitazone-glimepiride (DUETACT) 30-2 MG per tablet Take 1 tablet by mouth daily.        . pravastatin (PRAVACHOL) 40 MG tablet Take 40 mg by mouth at bedtime.       . ramipril (ALTACE) 10 MG capsule Take 2 capsules (20 mg total) by mouth daily.  180 capsule  3  . Tamsulosin HCl (FLOMAX) 0.4 MG CAPS Take 0.4 mg by mouth daily.        No current facility-administered medications for this visit.    Past Medical History  Diagnosis Date  . Type 2 diabetes mellitus   . GERD (gastroesophageal reflux disease)   . Essential hypertension, benign   . Coronary atherosclerosis of native coronary artery     Remote MI 2004 s/p LAD PCI in [place], s/p DES to mid LAD for ISR 01/2010; s/p cutting  balloon PCI to prox LAD for ISR 11/16/11   . Ischemic cardiomyopathy     LVEF 40-45% with apical akinesis with scar as well as anterolateral hypokinesis by Dr. Margarita Mail note 02/2011,  was 50% at cath 01/2010  . Sinus bradycardia   . Renal insufficiency     Cr 1.35 as outpatient 03/2011  . Syncope     Status post bradycardia on high-dose blocker therapy    Social History Mr. Scrima reports that he has never smoked. He has never used smokeless tobacco. Mr. Mcginness reports that he does not drink alcohol.  Review of Systems No palpitations, dizziness, syncope. Good appetite. Trying to stay active. No claudication. Otherwise negative.  Physical Examination Filed Vitals:   03/12/13 0804  BP: 136/87  Pulse: 54   Filed Weights   03/12/13 0804  Weight: 214 lb (97.07 kg)   HEENT: Conjunctiva and lids normal, oropharynx clear.  Neck: Supple, no elevated JVP or carotid bruits, no thyromegaly.  Lungs: Clear to auscultation, nonlabored breathing at rest.  Cardiac: Regular rate and rhythm, no S3 or significant systolic murmur.  Abdomen: Soft, nontender, bowel sounds present.  Extremities: No pitting edema, distal pulses 2+.  Skin: Warm and dry.  Musculoskeletal: No kyphosis.  Neuropsychiatric: Alert and oriented x3, affect grossly appropriate.  Problem List and Plan   Coronary atherosclerosis of native coronary artery Symptomatically stable on present medical therapy. Continuing DAPT given history of complex interventions, most recently cutting balloon angioplasty to the proximal LAD due to ISR last year. ECG reviewed.  Essential hypertension, benign No change to current regimen.  Mixed hyperlipidemia Continues on Pravachol. Reports recent total cholesterol 147. Keep follow up with Dr. Willaim Bane.    Jonelle Sidle, M.D., F.A.C.C.

## 2013-03-12 NOTE — Assessment & Plan Note (Signed)
Continues on Pravachol. Reports recent total cholesterol 147. Keep follow up with Dr. Willaim Bane.

## 2013-03-12 NOTE — Assessment & Plan Note (Signed)
Symptomatically stable on present medical therapy. Continuing DAPT given history of complex interventions, most recently cutting balloon angioplasty to the proximal LAD due to ISR last year. ECG reviewed.

## 2013-03-12 NOTE — Assessment & Plan Note (Signed)
No change to current regimen. 

## 2013-04-29 ENCOUNTER — Other Ambulatory Visit: Payer: Self-pay | Admitting: Physician Assistant

## 2013-05-03 ENCOUNTER — Other Ambulatory Visit: Payer: Self-pay | Admitting: Physician Assistant

## 2013-05-05 ENCOUNTER — Other Ambulatory Visit: Payer: Self-pay | Admitting: Physician Assistant

## 2013-06-18 ENCOUNTER — Other Ambulatory Visit: Payer: Self-pay | Admitting: Physician Assistant

## 2013-06-22 ENCOUNTER — Inpatient Hospital Stay (HOSPITAL_COMMUNITY)
Admission: EM | Admit: 2013-06-22 | Discharge: 2013-06-24 | DRG: 287 | Disposition: A | Discharge status: Home / Self Care | Payer: 59 | Source: Other Acute Inpatient Hospital | Attending: Cardiology | Admitting: Cardiology

## 2013-06-22 DIAGNOSIS — I498 Other specified cardiac arrhythmias: Secondary | ICD-10-CM | POA: Diagnosis present

## 2013-06-22 DIAGNOSIS — I200 Unstable angina: Principal | ICD-10-CM

## 2013-06-22 DIAGNOSIS — IMO0001 Reserved for inherently not codable concepts without codable children: Secondary | ICD-10-CM | POA: Diagnosis present

## 2013-06-22 DIAGNOSIS — E1121 Type 2 diabetes mellitus with diabetic nephropathy: Secondary | ICD-10-CM | POA: Diagnosis present

## 2013-06-22 DIAGNOSIS — Z79899 Other long term (current) drug therapy: Secondary | ICD-10-CM

## 2013-06-22 DIAGNOSIS — I252 Old myocardial infarction: Secondary | ICD-10-CM

## 2013-06-22 DIAGNOSIS — I129 Hypertensive chronic kidney disease with stage 1 through stage 4 chronic kidney disease, or unspecified chronic kidney disease: Secondary | ICD-10-CM | POA: Diagnosis present

## 2013-06-22 DIAGNOSIS — R079 Chest pain, unspecified: Secondary | ICD-10-CM | POA: Diagnosis present

## 2013-06-22 DIAGNOSIS — I10 Essential (primary) hypertension: Secondary | ICD-10-CM | POA: Diagnosis present

## 2013-06-22 DIAGNOSIS — Z7902 Long term (current) use of antithrombotics/antiplatelets: Secondary | ICD-10-CM

## 2013-06-22 DIAGNOSIS — I2589 Other forms of chronic ischemic heart disease: Secondary | ICD-10-CM | POA: Diagnosis present

## 2013-06-22 DIAGNOSIS — Z7982 Long term (current) use of aspirin: Secondary | ICD-10-CM

## 2013-06-22 DIAGNOSIS — N182 Chronic kidney disease, stage 2 (mild): Secondary | ICD-10-CM | POA: Diagnosis present

## 2013-06-22 DIAGNOSIS — Z9861 Coronary angioplasty status: Secondary | ICD-10-CM

## 2013-06-22 DIAGNOSIS — I2510 Atherosclerotic heart disease of native coronary artery without angina pectoris: Secondary | ICD-10-CM | POA: Diagnosis present

## 2013-06-22 DIAGNOSIS — E785 Hyperlipidemia, unspecified: Secondary | ICD-10-CM | POA: Diagnosis present

## 2013-06-22 DIAGNOSIS — K219 Gastro-esophageal reflux disease without esophagitis: Secondary | ICD-10-CM | POA: Diagnosis present

## 2013-06-22 DIAGNOSIS — R55 Syncope and collapse: Secondary | ICD-10-CM | POA: Diagnosis present

## 2013-06-22 DIAGNOSIS — E782 Mixed hyperlipidemia: Secondary | ICD-10-CM | POA: Diagnosis present

## 2013-06-22 HISTORY — DX: Chronic kidney disease, stage 2 (mild): N18.2

## 2013-06-22 MED ORDER — ONDANSETRON HCL 4 MG/2ML IJ SOLN: 4.0000 mg | Freq: Four times a day (QID) | INTRAMUSCULAR | Status: DC | PRN

## 2013-06-22 MED ORDER — FUROSEMIDE 20 MG PO TABS
20.0000 mg | ORAL_TABLET | Freq: Every day | ORAL | Status: DC
Start: 2013-06-23 — End: 2013-06-24
  Administered 2013-06-23: 20 mg via ORAL
  Filled 2013-06-22 (×2): qty 1

## 2013-06-22 MED ORDER — ASPIRIN EC 81 MG PO TBEC
81.0000 mg | DELAYED_RELEASE_TABLET | Freq: Every day | ORAL | Status: DC
  Filled 2013-06-22 (×2): qty 1

## 2013-06-22 MED ORDER — CLOPIDOGREL BISULFATE 75 MG PO TABS
75.0000 mg | ORAL_TABLET | Freq: Every day | ORAL | Status: DC
  Administered 2013-06-23 – 2013-06-24 (×2): 75 mg via ORAL
  Filled 2013-06-22 (×2): qty 1

## 2013-06-22 MED ORDER — ACETAMINOPHEN 500 MG PO TABS: 500.0000 mg | ORAL_TABLET | Freq: Four times a day (QID) | ORAL | Status: DC | PRN

## 2013-06-22 MED ORDER — ASPIRIN 81 MG PO CHEW
81.0000 mg | CHEWABLE_TABLET | Freq: Every day | ORAL | Status: DC
  Administered 2013-06-23 – 2013-06-24 (×2): 81 mg via ORAL
  Filled 2013-06-22 (×2): qty 1

## 2013-06-22 MED ORDER — RAMIPRIL 10 MG PO CAPS
10.0000 mg | ORAL_CAPSULE | Freq: Two times a day (BID) | ORAL | Status: DC
  Administered 2013-06-23 – 2013-06-24 (×3): 10 mg via ORAL
  Filled 2013-06-22 (×5): qty 1

## 2013-06-22 MED ORDER — SODIUM CHLORIDE 0.9 % IJ SOLN: 3.0000 mL | Freq: Two times a day (BID) | INTRAMUSCULAR | Status: DC

## 2013-06-22 MED ORDER — PANTOPRAZOLE SODIUM 40 MG PO TBEC
40.0000 mg | DELAYED_RELEASE_TABLET | Freq: Every day | ORAL | Status: DC
  Administered 2013-06-23 – 2013-06-24 (×2): 40 mg via ORAL
  Filled 2013-06-22 (×2): qty 1

## 2013-06-22 MED ORDER — SODIUM CHLORIDE 0.9 % IJ SOLN: 3.0000 mL | INTRAMUSCULAR | Status: DC | PRN

## 2013-06-22 MED ORDER — SODIUM CHLORIDE 0.9 % IV SOLN: 250.0000 mL | INTRAVENOUS | Status: DC | PRN

## 2013-06-22 MED ORDER — NITROGLYCERIN 0.4 MG SL SUBL: 0.4000 mg | SUBLINGUAL_TABLET | SUBLINGUAL | Status: DC | PRN

## 2013-06-22 MED ORDER — SIMVASTATIN 40 MG PO TABS
40.0000 mg | ORAL_TABLET | Freq: Every day | ORAL | Status: DC
  Administered 2013-06-23: 40 mg via ORAL
  Filled 2013-06-22 (×2): qty 1

## 2013-06-22 MED ORDER — ACETAMINOPHEN 325 MG PO TABS: 650.0000 mg | ORAL_TABLET | ORAL | Status: DC | PRN

## 2013-06-22 MED ORDER — TAMSULOSIN HCL 0.4 MG PO CAPS
0.4000 mg | ORAL_CAPSULE | Freq: Every day | ORAL | Status: DC
  Administered 2013-06-23 – 2013-06-24 (×2): 0.4 mg via ORAL
  Filled 2013-06-22 (×2): qty 1

## 2013-06-22 NOTE — H&P (Signed)
Steven Haas is an 62 y.o. male.   Chief Complaint: Chest Pain HPI: Steven Haas is a 62 yo man followed by Dr. Diona Browner last seen 8/14 with PMH of coronary artery disease, sinus bradycardia, 10/13 Exercise cardiolite with e/o scar involving the entire apical, mid anterior, anteroseptal wall, no definite ischemia with EF 45%. He is on DAPT with last known intervention of cutting balloon angioplasty to the pLAD 11/2011. He actually has been doing ok. He has been doing ok until the last 3-4 days when he started developing left-sided chest pain. He continued to have pain until he received morphine at [facility]. He was started on heparin gtt and transferred to [facility]. On my discussion, he's chest pain free but feels the pain was heavy in nature (pressure) and similar to previous, however not always with exertion and not always improved with rest. He's been compliant with all of his medications, continuing on aspirin/plavix. He had a CTA negative for dissection.    Past Medical History  Diagnosis Date  . Type 2 diabetes mellitus   . GERD (gastroesophageal reflux disease)   . Essential hypertension, benign   . Coronary atherosclerosis of native coronary artery     Remote MI 2004 s/p LAD PCI in [place], s/p DES to mid LAD for ISR 01/2010; s/p cutting balloon PCI to prox LAD for ISR 11/16/11   . Ischemic cardiomyopathy     LVEF 40-45% with apical akinesis with scar as well as anterolateral hypokinesis by Dr. Margarita Mail note 02/2011,  was 50% at cath 01/2010  . Sinus bradycardia   . Renal insufficiency     Cr 1.35 as outpatient 03/2011  . Syncope     Status post bradycardia on high-dose blocker therapy    Past Surgical History  Procedure Laterality Date  . Shoulder surgery      Right  . Elbow surgery      Left  . Nose surgery      Tumors    Family History  Problem Relation Age of Onset  . Heart attack Father   . Stroke Father   . Hypertension Sister   . Hypertension Sister   . Hypertension  Sister   . Hypertension Sister    Social History:  reports that he has never smoked. He has never used smokeless tobacco. He reports that he does not drink alcohol or use illicit drugs.  Allergies: No Known Allergies  Medications Prior to Admission  Medication Sig Dispense Refill  . aspirin 81 MG chewable tablet Chew 81 mg by mouth daily.      . clopidogrel (PLAVIX) 75 MG tablet Take 75 mg by mouth daily.        . furosemide (LASIX) 20 MG tablet Take 1 tablet (20 mg total) by mouth daily.  90 tablet  3  . NITROSTAT 0.4 MG SL tablet PLAEC 1 TAB UNDER TONGUE EVERY 5 MINS AS NEEDED FOR CHEST PAIN**MAX 3TABS/IN 15 MIN**  25 tablet  2  . pantoprazole (PROTONIX) 40 MG tablet TAKE 1 TABLET (40 MG TOTAL) BY MOUTH DAILY AT 12 NOON.  90 tablet  3  . pioglitazone-glimepiride (DUETACT) 30-2 MG per tablet Take 1 tablet by mouth daily.        . pravastatin (PRAVACHOL) 40 MG tablet Take 40 mg by mouth at bedtime.       . ramipril (ALTACE) 10 MG capsule TAKE 2 CAPSULES BY MOUTH EVERY DAY  180 capsule  3  . Tamsulosin HCl (FLOMAX) 0.4 MG CAPS  Take 0.4 mg by mouth daily.         No results found for this or any previous visit (from the past 48 hour(s)). No results found.  Review of Systems  Constitutional: Positive for malaise/fatigue. Negative for fever, chills and weight loss.  HENT: Negative for ear pain.   Eyes: Negative for blurred vision, double vision and photophobia.  Respiratory: Positive for shortness of breath. Negative for cough and hemoptysis.   Cardiovascular: Positive for leg swelling. Negative for chest pain, palpitations and orthopnea.  Gastrointestinal: Negative for heartburn, nausea, vomiting and abdominal pain.  Genitourinary: Negative for dysuria and hematuria.  Musculoskeletal: Negative for myalgias and neck pain.  Skin: Negative for rash.  Neurological: Positive for dizziness and weakness. Negative for tingling, tremors, loss of consciousness and headaches.   Endo/Heme/Allergies: Negative for environmental allergies.  Psychiatric/Behavioral: Negative for depression, suicidal ideas and substance abuse.    Blood pressure 132/71, pulse 57, temperature 98 F (36.7 C), resp. rate 20, height 6\' 4"  (1.93 m), weight 94.031 kg (207 lb 4.8 oz), SpO2 98.00%. Physical Exam  Nursing note and vitals reviewed. Constitutional: He is oriented to person, place, and time. He appears well-developed and well-nourished. No distress.  HENT:  Head: Normocephalic and atraumatic.  Nose: Nose normal.  Mouth/Throat: Oropharynx is clear and moist. No oropharyngeal exudate.  Eyes: Conjunctivae and EOM are normal. Pupils are equal, round, and reactive to light. No scleral icterus.  Neck: Normal range of motion. Neck supple. No JVD present. No tracheal deviation present. No thyromegaly present.  Cardiovascular: Normal rate and regular rhythm.  Exam reveals no gallop.   No murmur heard.    Respiratory: Effort normal and breath sounds normal. No respiratory distress. He has no wheezes.  GI: Soft. Bowel sounds are normal. He exhibits no distension. There is no tenderness.  Musculoskeletal: Normal range of motion. He exhibits edema.  Trace pedal edema  Neurological: He is alert and oriented to person, place, and time. No cranial nerve deficit. Coordination normal.  Skin: Skin is warm and dry. No rash noted. He is not diaphoretic. No erythema.  Psychiatric: He has a normal mood and affect. His behavior is normal. Thought content normal.    Labs and ECG reviewed from [facility]; sinus rhythm,  LAD, anterior infarct old D-dimer negative; Cr 1.27, h/h 14.7/44.3, inr 1.1, Trop 0.01 Problem List Chest Pain - Unstable angina Known Coronary Artery Disease Hypertension Dyslipidemia GERD T2DM BPH  Assessment/Plan 62 yo man with PMH of CAD, hypertension, dyslipidemia who presents to [facility] with CP and transferred to [facility] on heparin gtt. Differential is musculoskeletal,  unstable angina, pericarditis among other etiologies. I favor mild progression of disease. Ok to continue heparin but will trend cardiac markers overnight and have to weight decision of functional test vs. LHC.  - telemetry admission, NPO after MN - LHC vs. Functional study; perhaps review films before decision - continue aspirin, heparin gtt  - continue plavix  - continue PPI - update labs otherwise   Joycelin Radloff 06/22/2013, 10:56 PM

## 2013-06-23 ENCOUNTER — Encounter (HOSPITAL_COMMUNITY): Payer: Self-pay | Admitting: *Deleted

## 2013-06-23 DIAGNOSIS — I200 Unstable angina: Principal | ICD-10-CM

## 2013-06-23 LAB — HEMOGLOBIN A1C
Hgb A1c MFr Bld: 7.8 % — ABNORMAL HIGH (ref <5.7)
Mean Plasma Glucose: 177 mg/dL — ABNORMAL HIGH (ref <117)

## 2013-06-23 LAB — LIPID PANEL
HDL: 36 mg/dL — ABNORMAL LOW (ref >39)
LDL Cholesterol: 75 mg/dL (ref 0–99)
Total CHOL/HDL Ratio: 3.9 RATIO
Triglycerides: 142 mg/dL (ref <150)
VLDL: 28 mg/dL (ref 0–40)

## 2013-06-23 LAB — COMPREHENSIVE METABOLIC PANEL
ALT: 22 U/L (ref 0–53)
BUN: 25 mg/dL — ABNORMAL HIGH (ref 6–23)
CO2: 26 mEq/L (ref 19–32)
Calcium: 8.6 mg/dL (ref 8.4–10.5)
Chloride: 104 mEq/L (ref 96–112)
GFR calc Af Amer: 71 mL/min — ABNORMAL LOW (ref >90)
GFR calc non Af Amer: 62 mL/min — ABNORMAL LOW (ref >90)
Glucose, Bld: 161 mg/dL — ABNORMAL HIGH (ref 70–99)
Sodium: 137 mEq/L (ref 135–145)
Total Bilirubin: 0.6 mg/dL (ref 0.3–1.2)
Total Protein: 6.2 g/dL (ref 6.0–8.3)

## 2013-06-23 LAB — CBC WITH DIFFERENTIAL/PLATELET
Basophils Relative: 0 % (ref 0–1)
Eosinophils Relative: 2 % (ref 0–5)
Hemoglobin: 14 g/dL (ref 13.0–17.0)
Lymphs Abs: 1.9 10*3/uL (ref 0.7–4.0)
MCH: 30.1 pg (ref 26.0–34.0)
MCV: 85.2 fL (ref 78.0–100.0)
Monocytes Absolute: 0.4 10*3/uL (ref 0.1–1.0)
Neutro Abs: 4.2 10*3/uL (ref 1.7–7.7)
Neutrophils Relative %: 63 % (ref 43–77)
RBC: 4.65 MIL/uL (ref 4.22–5.81)

## 2013-06-23 LAB — GLUCOSE, CAPILLARY: Glucose-Capillary: 148 mg/dL — ABNORMAL HIGH (ref 70–99)

## 2013-06-23 LAB — HEPARIN LEVEL (UNFRACTIONATED)
Heparin Unfractionated: 0.34 IU/mL (ref 0.30–0.70)
Heparin Unfractionated: 0.35 IU/mL (ref 0.30–0.70)

## 2013-06-23 LAB — APTT: aPTT: 117 seconds — ABNORMAL HIGH (ref 24–37)

## 2013-06-23 LAB — TROPONIN I: Troponin I: <0.3 ng/mL (ref <0.30)

## 2013-06-23 MED ORDER — HEPARIN (PORCINE) IN NACL 100-0.45 UNIT/ML-% IJ SOLN
1250.0000 [IU]/h | INTRAMUSCULAR | Status: DC
  Administered 2013-06-23: 1250 [IU]/h via INTRAVENOUS
  Filled 2013-06-23 (×3): qty 250

## 2013-06-23 MED ORDER — INSULIN ASPART 100 UNIT/ML ~~LOC~~ SOLN
0.0000 [IU] | Freq: Three times a day (TID) | SUBCUTANEOUS | Status: DC
  Administered 2013-06-23: 5 [IU] via SUBCUTANEOUS
  Administered 2013-06-24: 2 [IU] via SUBCUTANEOUS

## 2013-06-23 NOTE — Progress Notes (Signed)
UR completed.  Patient changed to inpatient r/t requiring IV heparin, cardiac cath tomorrow.

## 2013-06-23 NOTE — Progress Notes (Signed)
ANTICOAGULATION CONSULT NOTE - FOLLOW UP  Pharmacy Consult for heparin Indication: chest pain/ACS  No Known Allergies  Patient Measurements: Height: 6\' 4"  (193 cm) Weight: 207 lb 4.8 oz (94.031 kg) IBW/kg (Calculated) : 86.8  Vital Signs: Temp: 97.7 F (36.5 C) (12/08 0559) Temp src: Oral (12/08 0559) BP: 125/73 mmHg (12/08 1030) Pulse Rate: 58 (12/08 1030)  Estimated Creatinine Clearance: 77.8 ml/min (by C-G formula based on Cr of 1.24).   Assessment: 62yo male presented to OSH c/o CP x3-4d, now CP-free after morphine, to continue heparin already started during further w/u. Currently running heparin infusion at 1200 units/hr, HL 0.34. CBC stable. No bleeding noted.   Goal of Therapy:  Heparin level 0.3-0.7 units/ml Monitor platelets by anticoagulation protocol: Yes   Plan:  1) Increase heparin infusion to 1250 units/hr  2) F/u daily anti-Xa levels  3) Monitor CBC and s/s of bleeding  4) F/u anticoagulation plans   Vinnie Level, PharmD.  Clinical Pharmacist Pager [phone]

## 2013-06-23 NOTE — Progress Notes (Signed)
   SUBJECTIVE:  Currently no pain.  However, his pain is similar to the pain he has had at the time of previous interventions.     PHYSICAL EXAM Filed Vitals:   06/22/13 2246 06/22/13 2312 06/23/13 0559 06/23/13 1030  BP: 132/71  111/71 125/73  Pulse: 57  78 58  Temp: 98 F (36.7 C)  97.7 F (36.5 C)   TempSrc:   Oral   Resp: 20  20   Height: 6\' 4"  (1.93 m)     Weight: 207 lb 4.8 oz (94.031 kg)     SpO2: 98% 98% 99%    General:  No acute distress Lungs:  Clear Heart:  RRR Abdomen:  Positive bowel sounds, no rebound no guarding Extremities:  No edema  LABS: Lab Results  Component Value Date   TROPONINI <0.30 06/23/2013   Results for orders placed during the hospital encounter of 06/22/13 (from the past 24 hour(s))  TROPONIN I     Status: None   Collection Time    06/23/13  5:30 AM      Result Value Range   Troponin I <0.30  <0.30 ng/mL  TROPONIN I     Status: None   Collection Time    06/23/13 10:20 AM      Result Value Range   Troponin I <0.30  <0.30 ng/mL  HEPARIN LEVEL (UNFRACTIONATED)     Status: None   Collection Time    06/23/13 10:20 AM      Result Value Range   Heparin Unfractionated 0.35  0.30 - 0.70 IU/mL  GLUCOSE, CAPILLARY     Status: None   Collection Time    06/23/13 11:15 AM      Result Value Range   Glucose-Capillary 94  70 - 99 mg/dL   No intake or output data in the 24 hours ending 06/23/13 1150   ASSESSMENT AND PLAN:  CHEST PAIN:  Ruled out.  The pretest probability of obstructive CAD is high.  Probable unstable angina.  Cath in AM.  The patient understands that risks included but are not limited to stroke (1 in 1000), death (1 in 1000), kidney failure [usually temporary] (1 in 500), bleeding (1 in 200), allergic reaction [possibly serious] (1 in 200).  The patient understands and agrees to proceed.   HTN:  Continue current therapy.      Jayce [facility] 06/23/2013 11:50 AM

## 2013-06-23 NOTE — Progress Notes (Addendum)
ANTICOAGULATION CONSULT NOTE - Initial Consult  Pharmacy Consult for heparin Indication: chest pain/ACS  No Known Allergies  Patient Measurements: Height: 6\' 4"  (193 cm) Weight: 207 lb 4.8 oz (94.031 kg) IBW/kg (Calculated) : 86.8  Vital Signs: Temp: 98 F (36.7 C) (12/07 2246) BP: 132/71 mmHg (12/07 2246) Pulse Rate: 57 (12/07 2246)  Estimated Creatinine Clearance: 77.8 ml/min (by C-G formula based on Cr of 1.24).   Medical History: Past Medical History  Diagnosis Date  . Type 2 diabetes mellitus   . GERD (gastroesophageal reflux disease)   . Essential hypertension, benign   . Coronary atherosclerosis of native coronary artery     Remote MI 2004 s/p LAD PCI in [place], s/p DES to mid LAD for ISR 01/2010; s/p cutting balloon PCI to prox LAD for ISR 11/16/11   . Ischemic cardiomyopathy     LVEF 40-45% with apical akinesis with scar as well as anterolateral hypokinesis by Dr. Margarita Mail note 02/2011,  was 50% at cath 01/2010  . Sinus bradycardia   . Renal insufficiency     Cr 1.35 as outpatient 03/2011  . Syncope     Status post bradycardia on high-dose blocker therapy    Medications:  Prescriptions prior to admission  Medication Sig Dispense Refill  . acetaminophen (TYLENOL) 500 MG tablet Take 500 mg by mouth every 6 (six) hours as needed.      Marland Kitchen aspirin 81 MG chewable tablet Chew 81 mg by mouth daily.      . clopidogrel (PLAVIX) 75 MG tablet Take 75 mg by mouth daily.        . furosemide (LASIX) 20 MG tablet Take 1 tablet (20 mg total) by mouth daily.  90 tablet  3  . pantoprazole (PROTONIX) 40 MG tablet TAKE 1 TABLET (40 MG TOTAL) BY MOUTH DAILY AT 12 NOON.  90 tablet  3  . pioglitazone-glimepiride (DUETACT) 30-2 MG per tablet Take 1 tablet by mouth daily.        . pravastatin (PRAVACHOL) 40 MG tablet Take 40 mg by mouth at bedtime.       . ramipril (ALTACE) 10 MG capsule TAKE 2 CAPSULES BY MOUTH EVERY DAY  180 capsule  3  . Tamsulosin HCl (FLOMAX) 0.4 MG CAPS Take 0.4 mg  by mouth daily.       Marland Kitchen NITROSTAT 0.4 MG SL tablet PLAEC 1 TAB UNDER TONGUE EVERY 5 MINS AS NEEDED FOR CHEST PAIN**MAX 3TABS/IN 15 MIN**  25 tablet  2   Scheduled:  . aspirin  81 mg Oral Daily  . aspirin EC  81 mg Oral Daily  . clopidogrel  75 mg Oral Daily  . furosemide  20 mg Oral Daily  . pantoprazole  40 mg Oral Daily  . ramipril  10 mg Oral BID  . simvastatin  40 mg Oral q1800  . sodium chloride  3 mL Intravenous Q12H  . tamsulosin  0.4 mg Oral Daily   Infusions:  . heparin      Assessment: 62yo male presented to OSH c/o CP x3-4d, now CP-free after morphine, to continue heparin already started during further w/u.  Goal of Therapy:  Heparin level 0.3-0.7 units/ml Monitor platelets by anticoagulation protocol: Yes   Plan:  Rec'd heparin 5000 units IV bolus followed by gtt at 1000 units/hr; will continue for now and monitor heparin levels and CBC.  Vernard Gambles, PharmD, BCPS  06/23/2013,12:56 AM   Addendum:  Initial heparin level results at low end of goal though was drawn  just 4hr from large bolus so expect true level would be lower.  Will increase gtt by 2 units/kg/hr to 1200 units/hr and check level in 6hr.   VB 06/23/2013 3:46 AM

## 2013-06-24 ENCOUNTER — Encounter (HOSPITAL_COMMUNITY)
Admission: EM | Disposition: A | Discharge status: Home / Self Care | Payer: Self-pay | Source: Other Acute Inpatient Hospital | Attending: Cardiology

## 2013-06-24 ENCOUNTER — Encounter (HOSPITAL_COMMUNITY): Payer: Self-pay | Admitting: Physician Assistant

## 2013-06-24 DIAGNOSIS — I200 Unstable angina: Secondary | ICD-10-CM

## 2013-06-24 DIAGNOSIS — I2510 Atherosclerotic heart disease of native coronary artery without angina pectoris: Secondary | ICD-10-CM | POA: Insufficient documentation

## 2013-06-24 HISTORY — PX: LEFT HEART CATHETERIZATION WITH CORONARY ANGIOGRAM: SHX5451

## 2013-06-24 LAB — CBC
HCT: 41.2 % (ref 39.0–52.0)
MCH: 30.1 pg (ref 26.0–34.0)
MCHC: 35 g/dL (ref 30.0–36.0)
MCV: 86.2 fL (ref 78.0–100.0)
RBC: 4.78 MIL/uL (ref 4.22–5.81)
RDW: 13.1 % (ref 11.5–15.5)

## 2013-06-24 LAB — HEPARIN LEVEL (UNFRACTIONATED): Heparin Unfractionated: 0.4 IU/mL (ref 0.30–0.70)

## 2013-06-24 LAB — GLUCOSE, CAPILLARY: Glucose-Capillary: 115 mg/dL — ABNORMAL HIGH (ref 70–99)

## 2013-06-24 SURGERY — LEFT HEART CATHETERIZATION WITH CORONARY ANGIOGRAM
Anesthesia: LOCAL

## 2013-06-24 MED ORDER — ONDANSETRON HCL 4 MG/2ML IJ SOLN: 4.0000 mg | Freq: Four times a day (QID) | INTRAMUSCULAR | Status: DC | PRN

## 2013-06-24 MED ORDER — NITROGLYCERIN 0.2 MG/ML ON CALL CATH LAB
INTRAVENOUS | Status: AC
  Filled 2013-06-24: qty 1

## 2013-06-24 MED ORDER — ACETAMINOPHEN 325 MG PO TABS: 650.0000 mg | ORAL_TABLET | ORAL | Status: DC | PRN

## 2013-06-24 MED ORDER — MIDAZOLAM HCL 2 MG/2ML IJ SOLN
INTRAMUSCULAR | Status: AC
  Filled 2013-06-24: qty 2

## 2013-06-24 MED ORDER — HEPARIN (PORCINE) IN NACL 2-0.9 UNIT/ML-% IJ SOLN
INTRAMUSCULAR | Status: AC
  Filled 2013-06-24: qty 1000

## 2013-06-24 MED ORDER — SODIUM CHLORIDE 0.9 % IV SOLN
1.0000 mL/kg/h | INTRAVENOUS | Status: DC
Start: 2013-06-24 — End: 2013-06-24
  Administered 2013-06-24 (×2): 1 mL/kg/h via INTRAVENOUS

## 2013-06-24 MED ORDER — SODIUM CHLORIDE 0.9 % IV SOLN: 250.0000 mL | INTRAVENOUS | Status: DC | PRN

## 2013-06-24 MED ORDER — HEPARIN SODIUM (PORCINE) 1000 UNIT/ML IJ SOLN
INTRAMUSCULAR | Status: AC
  Filled 2013-06-24: qty 1

## 2013-06-24 MED ORDER — LIDOCAINE HCL (PF) 1 % IJ SOLN
INTRAMUSCULAR | Status: AC
  Filled 2013-06-24: qty 30

## 2013-06-24 MED ORDER — SODIUM CHLORIDE 0.9 % IJ SOLN: 3.0000 mL | INTRAMUSCULAR | Status: DC | PRN

## 2013-06-24 MED ORDER — SODIUM CHLORIDE 0.9 % IJ SOLN: 3.0000 mL | Freq: Two times a day (BID) | INTRAMUSCULAR | Status: DC

## 2013-06-24 MED ORDER — VERAPAMIL HCL 2.5 MG/ML IV SOLN
INTRAVENOUS | Status: AC
  Filled 2013-06-24: qty 2

## 2013-06-24 MED ORDER — SODIUM CHLORIDE 0.9 % IV SOLN
INTRAVENOUS | Status: DC
  Administered 2013-06-24: 12:00:00 via INTRAVENOUS

## 2013-06-24 MED FILL — Heparin Sodium (Porcine) Inj 5000 Unit/ML: INTRAMUSCULAR | Qty: 1 | Status: AC

## 2013-06-24 MED FILL — Aspirin Chew Tab 81 MG: ORAL | Qty: 2250 | Status: AC

## 2013-06-24 NOTE — Progress Notes (Signed)
ANTICOAGULATION CONSULT NOTE - FOLLOW UP  Pharmacy Consult for heparin Indication: chest pain/ACS  No Known Allergies  Patient Measurements: Height: 6\' 4"  (193 cm) Weight: 206 lb 9.6 oz (93.713 kg) IBW/kg (Calculated) : 86.8  Vital Signs: Temp: 97.8 F (36.6 C) (12/09 0500) BP: 120/56 mmHg (12/09 0500) Pulse Rate: 51 (12/09 0500)  Estimated Creatinine Clearance: 77.8 ml/min (by C-G formula based on Cr of 1.24).   Assessment: 62yo male with probably unstable angina and planned cath this AM. Currently running heparin infusion at 1250 units/hr, HL therapeutic at 0.4. CBC stable. No bleeding noted.   Goal of Therapy:  Heparin level 0.3-0.7 units/ml Monitor platelets by anticoagulation protocol: Yes   Plan:  1) Continue heparin infusion to 1250 units/hr  2) F/u daily anti-Xa levels  3) Monitor CBC and s/s of bleeding  4) F/u anticoagulation plans post cath   Vinnie Level, PharmD.  Clinical Pharmacist Pager [phone]

## 2013-06-24 NOTE — Progress Notes (Signed)
TR band removed and right radial site a level 0.  Site clean and dry.  Positive Reverse Allen's test was performed.  Will continue to monitor. [place], Mitzi Hansen

## 2013-06-24 NOTE — CV Procedure (Signed)
   Cardiac Catheterization Procedure Note  Name: Steven Haas MRN: [id number] DOB: 1950-08-12  Procedure: Left Heart Cath, Selective Coronary Angiography, LV angiography  Indication:  Unstable angina  Procedural details: The right radial was prepped, draped, and anesthetized with 1% lidocaine. Using modified Seldinger technique, a 5 French sheath was introduced into the right radial artery. Standard Judkins catheters were used for coronary angiography and left ventriculography. Catheter exchanges were performed over a guidewire. There were no immediate procedural complications. The patient was transferred to the post catheterization recovery area for further monitoring.  Procedural Findings:   Hemodynamics:     AO 134/72    LV 137/7   Coronary angiography:   Coronary dominance: Left  Left mainstem:   Normal.    Left anterior descending (LAD):   Proximal stent with mild diffuse luminal irregularities.  D1 - D3 small and normal.    Left circumflex (LCx):  AV groove is very large.  AV groove with diffuse 25% plaque.  RI small/moderate and normal.  MOM large and branching with inferior long 25% stenosis.  PDA is large/moderate sized with mild luminal irregularities.  Right coronary artery (RCA):  Small.  Nondominant mild luminal irregularities.  Left ventriculography: Left ventricular systolic function is mildly reduced, LVEF is estimated at 50 with apical hypokinesis, there is no significant mitral regurgitation   Final Conclusions:  Patent LAD stent with nonobstructive residual CAD.  Mild LV dysfunction.  Recommendations:   Continued medical management.    [name] 06/24/2013, 11:26 AM

## 2013-06-24 NOTE — Discharge Summary (Signed)
Discharge Summary   Patient ID: JONH MCQUEARY MRN: [id number], DOB/AGE: [id number] 60 y.o. Admit date: 06/22/2013 D/C date:     06/24/2013  Primary Care Provider: [name], MD Primary Cardiologist: Molli Hazard  Primary Discharge Diagnoses:  1. Chest pain, noncardiac 2. CAD - cath with patent LAD, otherwise nonobstructive disease by cath this admission - history: Remote MI 2004 s/p LAD PCI in [place], s/p DES to mid LAD for ISR 01/2010; s/p cutting balloon PCI to prox LAD for ISR 11/16/11  3. HTN 4. Type 2 diabetes mellitus, uncontrolled (A1C 7.8) 5. Probable CKD stage II  Secondary Discharge Diagnoses:  1. GERD (gastroesophageal reflux disease)   2. Ischemic cardiomyopathy LVEF 40-45% with apical akinesis with scar as well as anterolateral hypokinesis by Dr. Margarita Mail note 02/2011, was 50% at cath this admission  3. Sinus bradycardia - metoprolol stopped 11/2011 due to this  4. Syncope Status post bradycardia on high-dose blocker therapy   Hospital Course: Mr. Pavone is a 62 y/o M with history of CAD, sinus bradycardia, HTN, GERD who presented to [facility] 06/22/13 with chest pain. He is on DAPT with last known intervention of cutting balloon angioplasty to the pLAD 11/2011. He was doing well until the last 3-4 days when he began developing left-sided CP. He continued to have pain until he received morphine at [facility]. He described the pain as heavy in nature (pressure) and similar to previous, however not always with exertion and not always improved with rest. He reported med compliance including ASA/Plavix. Historically outpatient notes indicate he has been continued on DAPT given history of complex interventions and ISR. At [facility], he was started on heparin drip and transferred to [facility]. Initial troponin was negative, CTA negative for dissection, d-dimer wnl. EKG showed sinus rhythm, LAD, anterior infarct old. The differential included musculoskeletal,  unstable angina, pericarditis among other etiologies. Given high pretest probability of obstructive CAD and concern for unstable angina, cardiac cath was recommended so hew as transferred to [facility] Troponins remained negative. He underwent cath today showing a patent LAD stent and nonobstructive residual disease with mild LV dysfunction EF 50%. Continued medical management was recommended. P2y12 was 201. His chest pain was felt noncardiac, question musculoskeletal. He was instructed to f/u PCP to discuss further eval of non-heart causes of CP, as well as management of DM (A1C 7.8). Dr. Antoine Poche has seen and examined the patient today and feels he is stable for discharge.  Of note, the patient probably has CKD stage II looking at historical GFRs. Given cath and CT angio this admission, will plan for outpatient BMET on Thursday 06/26/13 to make sure Cr remains stable. The patient works a fairly strenuous job and per our discussion will return to work 06/30/13.  Discharge Vitals: Blood pressure 134/70, pulse 61, temperature 98 F (36.7 C), temperature source Oral, resp. rate 19, height 6\' 4"  (1.93 m), weight 206 lb 9.6 oz (93.713 kg), SpO2 94.00%.  Labs: Lab Results  Component Value Date   WBC 7.1 06/24/2013   HGB 14.4 06/24/2013   HCT 41.2 06/24/2013   MCV 86.2 06/24/2013   PLT 175 06/24/2013     Recent Labs Lab 06/22/13 0045  NA 137  K 4.0  CL 104  CO2 26  BUN 25*  CREATININE 1.24  CALCIUM 8.6  PROT 6.2  BILITOT 0.6  ALKPHOS 67  ALT 22  AST 17  GLUCOSE 161*    Recent Labs  06/22/13 0045 06/23/13 0530  06/23/13 1020  TROPONINI <0.30 <0.30 <0.30   Lab Results  Component Value Date   CHOL 139 06/22/2013   HDL 36* 06/22/2013   LDLCALC 75 06/22/2013   TRIG 142 06/22/2013   D-dimer neg at [place] <0.27  Diagnostic Studies/Procedures   CT angio 06/22/13 - no aortic dissection or abnormality; cardiomegaly; atheromatous coronary artery calcifications.  CXR:  cardiomegaly again noted. No active disease.  Cath 06/24/13 Cardiac Catheterization Procedure Note  Name: VINAY ERTL  MRN: [id number]  DOB: [birth year]  Procedure: Left Heart Cath, Selective Coronary Angiography, LV angiography  Indication: Unstable angina  Procedural details: The right radial was prepped, draped, and anesthetized with 1% lidocaine. Using modified Seldinger technique, a 5 French sheath was introduced into the right radial artery. Standard Judkins catheters were used for coronary angiography and left ventriculography. Catheter exchanges were performed over a guidewire. There were no immediate procedural complications. The patient was transferred to the post catheterization recovery area for further monitoring.  Procedural Findings:  Hemodynamics:  AO 134/72  LV 137/7  Coronary angiography:  Coronary dominance: Left  Left mainstem: Normal.  Left anterior descending (LAD): Proximal stent with mild diffuse luminal irregularities. D1 - D3 small and normal.  Left circumflex (LCx): AV groove is very large. AV groove with diffuse 25% plaque. RI small/moderate and normal. MOM large and branching with inferior long 25% stenosis. PDA is large/moderate sized with mild luminal irregularities.  Right coronary artery (RCA): Small. Nondominant mild luminal irregularities.  Left ventriculography: Left ventricular systolic function is mildly reduced, LVEF is estimated at 50 with apical hypokinesis, there is no significant mitral regurgitation  Final Conclusions: Patent LAD stent with nonobstructive residual CAD. Mild LV dysfunction.  Recommendations: Continued medical management.     Discharge Medications     Medication List         acetaminophen 500 MG tablet  Commonly known as:  TYLENOL  Take 500 mg by mouth every 6 (six) hours as needed.     aspirin 81 MG chewable tablet  Chew 81 mg by mouth daily.     clopidogrel 75 MG tablet  Commonly known as:  PLAVIX  Take 75 mg by  mouth daily.     furosemide 20 MG tablet  Commonly known as:  LASIX  Take 1 tablet (20 mg total) by mouth daily.     NITROSTAT 0.4 MG SL tablet  Generic drug:  nitroGLYCERIN  PLAEC 1 TAB UNDER TONGUE EVERY 5 MINS AS NEEDED FOR CHEST PAIN**MAX 3TABS/IN 15 MIN**     pantoprazole 40 MG tablet  Commonly known as:  PROTONIX  TAKE 1 TABLET (40 MG TOTAL) BY MOUTH DAILY AT 12 NOON.     pioglitazone-glimepiride 30-2 MG per tablet  Commonly known as:  DUETACT  Take 1 tablet by mouth daily.     pravastatin 40 MG tablet  Commonly known as:  PRAVACHOL  Take 40 mg by mouth at bedtime.     ramipril 10 MG capsule  Commonly known as:  ALTACE  TAKE 2 CAPSULES BY MOUTH EVERY DAY     tamsulosin 0.4 MG Caps capsule  Commonly known as:  FLOMAX  Take 0.4 mg by mouth daily.        Disposition   The patient will be discharged in stable condition to home. Discharge Orders   Future Appointments Provider Department Dept Phone   07/11/2013 1:40 PM Laqueta Linden, MD [facility] [phone]   09/22/2013 10:00 AM Jonelle Sidle,  MD [facility] [phone]   Future Orders Complete By Expires   Diet - low sodium heart healthy  As directed    Increase activity slowly  As directed    Comments:     No driving for 2 days. No lifting over 5 lbs for 1 week. No sexual activity for 1 week. You may return to work on 06/30/13. Keep procedure site clean & dry. If you notice increased pain, swelling, bleeding or pus, call/return!  You may shower, but no soaking baths/hot tubs/pools for 1 week.     Follow-up Information   Follow up with Labwork. (Please take lab slip to [facility] lab on Thursday 06/26/13 to have kidney function rechecked. If you know of a lab closer to home, you can also call them to see if you can have it drawn there. )       Follow up with Laqueta Linden, MD. (07/11/13 at 1:40pm - You will see Dr. Purvis Sheffield for  your hospital check-up visit since Dr. Diona Browner isn't back in the office around that time until January. You will then see Dr. Diona Browner in the future.)    Specialty:  Cardiology   Contact information:   [address] [place] [address] [phone]       Follow up with [name], MD. (Follow up with your primary care doctor to discuss evaluation of non-heart causes of chest pain. Your diabetes also needs to be better controlled - your A1C was 7.8 and goal is less than 7.)    Specialty:  Family Medicine   Contact information:   [address] [place] [address] [phone]         Duration of Discharge Encounter: Greater than 30 minutes including physician and PA time.  Signed, Dayna Dunn PA-C 06/24/2013, 2:52 PM    Patient seen and examined.  Plan as discussed in my rounding note for today and outlined above. Neftali Abair  06/24/2013  5:51 PM

## 2013-06-24 NOTE — H&P (View-Only) (Signed)
   SUBJECTIVE:  Currently no pain.  However, his pain is similar to the pain he has had at the time of previous interventions.     PHYSICAL EXAM Filed Vitals:   06/22/13 2246 06/22/13 2312 06/23/13 0559 06/23/13 1030  BP: 132/71  111/71 125/73  Pulse: 57  78 58  Temp: 98 F (36.7 C)  97.7 F (36.5 C)   TempSrc:   Oral   Resp: 20  20   Height: 6' 4" (1.93 m)     Weight: 207 lb 4.8 oz (94.031 kg)     SpO2: 98% 98% 99%    General:  No acute distress Lungs:  Clear Heart:  RRR Abdomen:  Positive bowel sounds, no rebound no guarding Extremities:  No edema  LABS: Lab Results  Component Value Date   TROPONINI <0.30 06/23/2013   Results for orders placed during the hospital encounter of 06/22/13 (from the past 24 hour(s))  TROPONIN I     Status: None   Collection Time    06/23/13  5:30 AM      Result Value Range   Troponin I <0.30  <0.30 ng/mL  TROPONIN I     Status: None   Collection Time    06/23/13 10:20 AM      Result Value Range   Troponin I <0.30  <0.30 ng/mL  HEPARIN LEVEL (UNFRACTIONATED)     Status: None   Collection Time    06/23/13 10:20 AM      Result Value Range   Heparin Unfractionated 0.35  0.30 - 0.70 IU/mL  GLUCOSE, CAPILLARY     Status: None   Collection Time    06/23/13 11:15 AM      Result Value Range   Glucose-Capillary 94  70 - 99 mg/dL   No intake or output data in the 24 hours ending 06/23/13 1150   ASSESSMENT AND PLAN:  CHEST PAIN:  Ruled out.  The pretest probability of obstructive CAD is high.  Probable unstable angina.  Cath in AM.  The patient understands that risks included but are not limited to stroke (1 in 1000), death (1 in 1000), kidney failure [usually temporary] (1 in 500), bleeding (1 in 200), allergic reaction [possibly serious] (1 in 200).  The patient understands and agrees to proceed.   HTN:  Continue current therapy.      Medard Jozalynn Noyce 06/23/2013 11:50 AM   

## 2013-06-24 NOTE — Interval H&P Note (Signed)
Cath Lab Visit (complete for each Cath Lab visit)  Clinical Evaluation Leading to the Procedure:   ACS: no  Non-ACS:    Anginal Classification: CCS IV  Anti-ischemic medical therapy: Maximal Therapy (2 or more classes of medications)  Non-Invasive Test Results: No non-invasive testing performed  Prior CABG: No previous CABG      History and Physical Interval Note:  06/24/2013 10:48 AM  Steven Haas  has presented today for surgery, with the diagnosis of Chest pain  The various methods of treatment have been discussed with the patient and family. After consideration of risks, benefits and other options for treatment, the patient has consented to  Procedure(s): LEFT HEART CATHETERIZATION WITH CORONARY ANGIOGRAM (N/A) as a surgical intervention .  The patient's history has been reviewed, patient examined, no change in status, stable for surgery.  I have reviewed the patient's chart and labs.  Questions were answered to the patient's satisfaction.     Rollene Rotunda

## 2013-06-24 NOTE — Progress Notes (Signed)
DC orders received.  Patient stable with no S/S of distress.  Medication and discharge information reviewed with patient and patient's family.  Patient DC home with wife. [place], Mitzi Hansen

## 2013-06-30 ENCOUNTER — Telehealth: Payer: Self-pay | Admitting: Cardiology

## 2013-06-30 NOTE — Telephone Encounter (Signed)
Pt called today because he states has a form  faxed to Dr. Antoine Poche describing his restrictions and ability to return to work. Pt states the company won't let him to go back to work until they get that form back. Pt would like to have that form fax back to [employer] to [phone] as soon as possible please. This message will be send to  MD's nurse for reviewing.

## 2013-06-30 NOTE — Telephone Encounter (Signed)
New Message  Pt called states that there was a form faxed describing his restrictions and the abiltiy to return back to work// please fax the form back to the information listed below as soon as possible// SR  [employer] [phone] Attn : Revonda Standard in HR

## 2013-07-11 ENCOUNTER — Ambulatory Visit (INDEPENDENT_AMBULATORY_CARE_PROVIDER_SITE_OTHER): Payer: 59 | Admitting: Cardiovascular Disease

## 2013-07-11 ENCOUNTER — Encounter: Payer: Self-pay | Admitting: Cardiovascular Disease

## 2013-07-11 VITALS — BP 154/91 | HR 73 | Ht 76.0 in | Wt 210.0 lb

## 2013-07-11 DIAGNOSIS — E782 Mixed hyperlipidemia: Secondary | ICD-10-CM

## 2013-07-11 DIAGNOSIS — R0789 Other chest pain: Secondary | ICD-10-CM

## 2013-07-11 DIAGNOSIS — I498 Other specified cardiac arrhythmias: Secondary | ICD-10-CM

## 2013-07-11 DIAGNOSIS — E119 Type 2 diabetes mellitus without complications: Secondary | ICD-10-CM

## 2013-07-11 DIAGNOSIS — R001 Bradycardia, unspecified: Secondary | ICD-10-CM

## 2013-07-11 DIAGNOSIS — I10 Essential (primary) hypertension: Secondary | ICD-10-CM

## 2013-07-11 DIAGNOSIS — I2510 Atherosclerotic heart disease of native coronary artery without angina pectoris: Secondary | ICD-10-CM

## 2013-07-11 DIAGNOSIS — R071 Chest pain on breathing: Secondary | ICD-10-CM

## 2013-07-11 NOTE — Progress Notes (Signed)
Patient ID: Steven Haas, male   DOB: 1951-04-08, 62 y.o.   MRN: [id number]      SUBJECTIVE: Is a 62 year old male who was recently hospitalized for chest pain which was deemed noncardiac in etiology. He underwent cardiac catheterization which revealed a patent LAD with otherwise nonobstructive disease. His most recent percutaneous coronary intervention was performed in May 2013. He also has hypertension, CKD stage II, and type 2 diabetes mellitus. He has a history of syncope as well as sinus bradycardia and is no longer on beta blocker therapy for this reason. He has had no further episodes of chest pain. He also denies shortness of breath and leg swelling. CT angiogram of the chest showed no dissection or masses. He has noticed some chest wall tenderness since this hospitalization. P2Y12 level was normal (less than 230).     No Known Allergies  Current Outpatient Prescriptions  Medication Sig Dispense Refill  . acetaminophen (TYLENOL) 500 MG tablet Take 500 mg by mouth every 6 (six) hours as needed.      Marland Kitchen aspirin 81 MG chewable tablet Chew 81 mg by mouth daily.      . clopidogrel (PLAVIX) 75 MG tablet Take 75 mg by mouth daily.        . furosemide (LASIX) 20 MG tablet Take 1 tablet (20 mg total) by mouth daily.  90 tablet  3  . NITROSTAT 0.4 MG SL tablet PLAEC 1 TAB UNDER TONGUE EVERY 5 MINS AS NEEDED FOR CHEST PAIN**MAX 3TABS/IN 15 MIN**  25 tablet  2  . pantoprazole (PROTONIX) 40 MG tablet TAKE 1 TABLET (40 MG TOTAL) BY MOUTH DAILY AT 12 NOON.  90 tablet  3  . pioglitazone-glimepiride (DUETACT) 30-2 MG per tablet Take 1 tablet by mouth daily.        . pravastatin (PRAVACHOL) 40 MG tablet Take 40 mg by mouth at bedtime.       . ramipril (ALTACE) 10 MG capsule TAKE 2 CAPSULES BY MOUTH EVERY DAY  180 capsule  3  . Tamsulosin HCl (FLOMAX) 0.4 MG CAPS Take 0.4 mg by mouth daily.        No current facility-administered medications for this visit.    Past Medical History  Diagnosis  Date  . Type 2 diabetes mellitus   . GERD (gastroesophageal reflux disease)   . Essential hypertension, benign   . Coronary atherosclerosis of native coronary artery     a. Remote MI 2004 s/p LAD PCI in [place]. b. s/p DES to mid LAD for ISR 01/2010. c. s/p cutting balloon PCI to prox LAD for ISR 11/16/11.  . Ischemic cardiomyopathy     a. LVEF 40-45% with apical akinesis with scar as well as anterolateral hypokinesis by Dr. Margarita Mail note 02/2011. b. 50% at cath 06/2013.  . Sinus bradycardia     a. Metoprolol stopped 11/2011 due to symptomatic bradycardia.  . Renal insufficiency     Cr 1.35 as outpatient 03/2011  . Syncope     Status post bradycardia on high-dose blocker therapy  . CKD (chronic kidney disease), stage II     a. Added to list 06/2013 after historical review of GFRs.    Past Surgical History  Procedure Laterality Date  . Shoulder surgery      Right  . Elbow surgery      Left  . Nose surgery      Tumors    History   Social History  . Marital Status: Widowed    Spouse Name:  N/A    Number of Children: N/A  . Years of Education: N/A   Occupational History  . Not on file.   Social History Main Topics  . Smoking status: Never Smoker   . Smokeless tobacco: Never Used  . Alcohol Use: No  . Drug Use: No  . Sexual Activity: Not on file   Other Topics Concern  . Not on file   Social History Narrative  . No narrative on file     Filed Vitals:   07/11/13 1332  BP: 154/91  Pulse: 73  Height: 6\' 4"  (1.93 m)  Weight: 210 lb (95.255 kg)    PHYSICAL EXAM General: NAD Neck: No JVD, no thyromegaly or thyroid nodule.  Lungs: Clear to auscultation bilaterally with normal respiratory effort. CV: Nondisplaced PMI.  Tenderness to palpation over left sternal border. Heart regular S1/S2, no S3/S4, I/VI holosystolic murmur along left sternal border.  No peripheral edema.  No carotid bruit.  Normal pedal pulses.  Abdomen: Soft, nontender, no hepatosplenomegaly, no  distention.  Neurologic: Alert and oriented x 3.  Psych: Normal affect. Extremities: No clubbing or cyanosis.   ECG: reviewed and available in electronic records.      ASSESSMENT AND PLAN: 1. CAD: symptomatically stable. LVEF 50% by cath. Continue ASA, Plavix, ramipril, and pravastatin. 2. HTN: slightly elevated today. He ate a considerable amount of sodium yesterday (Christmas). Will monitor and titrate meds as needed in future. 3. Hyperlipidemia: on pravastatin 40 mg daily. 4. Chest wall tenderness: this may be related to costochondritis.  Dispo: f/u 6 months.   Prentice Docker, M.D., F.A.C.C.

## 2013-07-11 NOTE — Patient Instructions (Signed)
Continue all current medications. Your physician wants you to follow up in: 6 months.  You will receive a reminder letter in the mail one-two months in advance.  If you don't receive a letter, please call our office to schedule the follow up appointment   

## 2013-09-22 ENCOUNTER — Ambulatory Visit: Payer: 59 | Admitting: Cardiology

## 2013-10-07 ENCOUNTER — Other Ambulatory Visit: Payer: Self-pay | Admitting: Cardiology

## 2013-12-23 HISTORY — PX: LAPAROSCOPIC CHOLECYSTECTOMY: SUR755

## 2013-12-31 ENCOUNTER — Encounter: Payer: Self-pay | Admitting: *Deleted

## 2014-01-02 ENCOUNTER — Ambulatory Visit (INDEPENDENT_AMBULATORY_CARE_PROVIDER_SITE_OTHER): Payer: 59 | Admitting: Cardiology

## 2014-01-02 ENCOUNTER — Encounter: Payer: Self-pay | Admitting: Cardiology

## 2014-01-02 VITALS — BP 120/72 | HR 76 | Ht 76.0 in | Wt 213.0 lb

## 2014-01-02 DIAGNOSIS — E782 Mixed hyperlipidemia: Secondary | ICD-10-CM

## 2014-01-02 DIAGNOSIS — I498 Other specified cardiac arrhythmias: Secondary | ICD-10-CM

## 2014-01-02 DIAGNOSIS — I10 Essential (primary) hypertension: Secondary | ICD-10-CM

## 2014-01-02 DIAGNOSIS — I2510 Atherosclerotic heart disease of native coronary artery without angina pectoris: Secondary | ICD-10-CM

## 2014-01-02 DIAGNOSIS — N058 Unspecified nephritic syndrome with other morphologic changes: Secondary | ICD-10-CM

## 2014-01-02 DIAGNOSIS — E1121 Type 2 diabetes mellitus with diabetic nephropathy: Secondary | ICD-10-CM

## 2014-01-02 DIAGNOSIS — E1129 Type 2 diabetes mellitus with other diabetic kidney complication: Secondary | ICD-10-CM

## 2014-01-02 DIAGNOSIS — R001 Bradycardia, unspecified: Secondary | ICD-10-CM

## 2014-01-02 NOTE — Assessment & Plan Note (Signed)
Asymptomatic, not on beta blocker.

## 2014-01-02 NOTE — Progress Notes (Signed)
Clinical Summary Steven Haas is a 63 y.o.male last seen in the office by Dr. Purvis SheffieldKoneswaran in December 2014. Record review finds [facility] this month with symptomatic cholelithiasis. He underwent laparoscopic cholecystectomy on June 9 with Dr. Marcha Soldersathey. He states he feels better, tolerated surgery well. He was off Plavix for only one day prior to the operation. Denies any bleeding problems.  Lab work from June 10 showed BUN 19, creatinine 1.3, potassium 4.4, hemoglobin 13.2, platelets 159. Chest x-ray demonstrated no acute cardiopulmonary disease.  Most recent cardiac catheterization was in December 2014 demonstrating patent LAD stent site with otherwise nonobstructive residual CAD, and LVEF estimated at 50% with apical hypokinesis.   No Known Allergies  Current Outpatient Prescriptions  Medication Sig Dispense Refill  . acetaminophen (TYLENOL) 500 MG tablet Take 500 mg by mouth every 6 (six) hours as needed.      Marland Kitchen. aspirin 81 MG chewable tablet Chew 81 mg by mouth daily.      . clopidogrel (PLAVIX) 75 MG tablet Take 75 mg by mouth daily.        . furosemide (LASIX) 40 MG tablet Take 20 mg by mouth.      Marland Kitchen. NITROSTAT 0.4 MG SL tablet PLAEC 1 TAB UNDER TONGUE EVERY 5 MINS AS NEEDED FOR CHEST PAIN**MAX 3TABS/IN 15 MIN**  25 tablet  2  . pantoprazole (PROTONIX) 40 MG tablet TAKE 1 TABLET BY MOUTH EVERY DAY AT 12 NOON  90 tablet  3  . pioglitazone-glimepiride (DUETACT) 30-2 MG per tablet Take 1 tablet by mouth daily.        . pravastatin (PRAVACHOL) 40 MG tablet Take 40 mg by mouth at bedtime.       . ramipril (ALTACE) 5 MG capsule Take 10 mg by mouth daily.      . Tamsulosin HCl (FLOMAX) 0.4 MG CAPS Take 0.4 mg by mouth daily.       . ramipril (ALTACE) 10 MG capsule TAKE 1 CAPSULES BY MOUTH EVERY DAY       No current facility-administered medications for this visit.    Past Medical History  Diagnosis Date  . Type 2 diabetes mellitus   . GERD  (gastroesophageal reflux disease)   . Essential hypertension, benign   . Coronary atherosclerosis of native coronary artery     Remote MI 2004 s/p LAD PCI in [place], s/p DES to mid LAD for ISR 01/2010, s/p cutting balloon PCI to prox LAD for ISR 11/16/11.  . Ischemic cardiomyopathy     LVEF 40-45% with apical akinesis with scar as well as anterolateral HK 02/2011, LVEF 50% at cath 06/2013.  . Sinus bradycardia     Metoprolol stopped 11/2011 due to symptomatic bradycardia.  . Syncope     Status post bradycardia on high-dose blocker therapy  . CKD (chronic kidney disease), stage II     Social History Steven Haas reports that he has never smoked. He has never used smokeless tobacco. Steven Haas reports that he does not drink alcohol.  Review of Systems No palpitations, dizziness, syncope. Other systems reviewed and negative.  Physical Examination Filed Vitals:   01/02/14 1251  BP: 120/72  Pulse: 76   Filed Weights   01/02/14 1251  Weight: 213 lb (96.616 kg)    Appears comfortable at rest. HEENT: Conjunctiva and lids normal, oropharynx clear.  Neck: Supple, no elevated JVP or carotid bruits, no thyromegaly.  Lungs: Clear to auscultation, nonlabored breathing at rest.  Cardiac: Regular rate and  rhythm, no S3 or significant systolic murmur.  Abdomen: Soft, nontender, bowel sounds present. Healing keyhole incisions. Extremities: No pitting edema, distal pulses 2+.  Skin: Warm and dry. Tanned. Musculoskeletal: No kyphosis.  Neuropsychiatric: Alert and oriented x3, affect grossly appropriate.   Problem List and Plan   Coronary atherosclerosis of native coronary artery Symptomatically stable on medical therapy. Most recent cardiac catheterization in December 2014 noted above. No change in current regimen. Followup 6 months.  Bradycardia Asymptomatic, not on beta blocker.  Essential hypertension, benign Blood pressure is normal today.  Mixed hyperlipidemia Continues on  Pravachol, followed by Dr. Willaim BanePark.  Type 2 diabetes mellitus with diabetic nephropathy Followed by Dr. Willaim BanePark.    Jonelle SidleSamuel G. McDowell, M.D., F.A.C.C.

## 2014-01-02 NOTE — Assessment & Plan Note (Signed)
Continues on Pravachol, followed by Dr. Willaim BanePark.

## 2014-01-02 NOTE — Patient Instructions (Signed)
Continue all current medications. Your physician wants you to follow up in: 6 months.  You will receive a reminder letter in the mail one-two months in advance.  If you don't receive a letter, please call our office to schedule the follow up appointment   

## 2014-01-02 NOTE — Assessment & Plan Note (Signed)
Symptomatically stable on medical therapy. Most recent cardiac catheterization in December 2014 noted above. No change in current regimen. Followup 6 months.

## 2014-01-02 NOTE — Assessment & Plan Note (Signed)
Blood pressure is normal today. 

## 2014-01-02 NOTE — Assessment & Plan Note (Signed)
Followed by Dr. Willaim BanePark.

## 2014-06-24 ENCOUNTER — Encounter (HOSPITAL_COMMUNITY): Payer: Self-pay | Admitting: Cardiology

## 2014-06-25 ENCOUNTER — Encounter (HOSPITAL_COMMUNITY): Payer: Self-pay | Admitting: Cardiology

## 2014-07-06 ENCOUNTER — Encounter: Payer: Self-pay | Admitting: Cardiology

## 2014-07-06 ENCOUNTER — Ambulatory Visit (INDEPENDENT_AMBULATORY_CARE_PROVIDER_SITE_OTHER): Payer: 59 | Admitting: Cardiology

## 2014-07-06 VITALS — BP 117/70 | HR 62 | Ht 76.0 in | Wt 210.0 lb

## 2014-07-06 DIAGNOSIS — I2510 Atherosclerotic heart disease of native coronary artery without angina pectoris: Secondary | ICD-10-CM

## 2014-07-06 DIAGNOSIS — E782 Mixed hyperlipidemia: Secondary | ICD-10-CM

## 2014-07-06 DIAGNOSIS — I10 Essential (primary) hypertension: Secondary | ICD-10-CM

## 2014-07-06 NOTE — Patient Instructions (Signed)

## 2014-07-06 NOTE — Assessment & Plan Note (Signed)
Blood pressure is normal today. 

## 2014-07-06 NOTE — Progress Notes (Signed)
Reason for visit: CAD, cardiomyopathy  Clinical Summary Mr. Neale BurlyFreeman is a 63 y.o.male last seen in June. He presents for a routine visit. States that he has been doing very well from a cardiac perspective, no angina, NYHA class I dyspnea. In the interim, he reportedly had a fall at work and fractured his left wrist/forearm. This is being managed by an orthopedic surgeon in [place], and he states that he is undergoing rehabilitation.  Cardiac regimen reviewed, he continues on aspirin and Plavix, Altace, Lasix, and Pravachol. He will be seeing Dr. Willaim BanePark around the first of the year for a physical with lab work.  Most recent cardiac catheterization was in December 2014 demonstrating patent LAD stent site with otherwise nonobstructive residual CAD, and LVEF estimated at 50% with apical hypokinesis.   No Known Allergies  Current Outpatient Prescriptions  Medication Sig Dispense Refill  . acetaminophen (TYLENOL) 500 MG tablet Take 500 mg by mouth every 6 (six) hours as needed.    Marland Kitchen. aspirin 81 MG chewable tablet Chew 81 mg by mouth daily.    . clopidogrel (PLAVIX) 75 MG tablet Take 75 mg by mouth daily.      . furosemide (LASIX) 40 MG tablet Take 20 mg by mouth.    . gabapentin (NEURONTIN) 600 MG tablet Take 1,200 mg by mouth 2 (two) times daily.    Marland Kitchen. NITROSTAT 0.4 MG SL tablet PLAEC 1 TAB UNDER TONGUE EVERY 5 MINS AS NEEDED FOR CHEST PAIN**MAX 3TABS/IN 15 MIN** 25 tablet 2  . pantoprazole (PROTONIX) 40 MG tablet TAKE 1 TABLET BY MOUTH EVERY DAY AT 12 NOON 90 tablet 3  . pioglitazone-glimepiride (DUETACT) 30-2 MG per tablet Take 1 tablet by mouth daily.      . pravastatin (PRAVACHOL) 40 MG tablet Take 40 mg by mouth at bedtime.     . ramipril (ALTACE) 5 MG capsule Take 10 mg by mouth daily.    . Tamsulosin HCl (FLOMAX) 0.4 MG CAPS Take 0.4 mg by mouth daily.      No current facility-administered medications for this visit.    Past Medical History  Diagnosis Date  . Type 2 diabetes  mellitus   . GERD (gastroesophageal reflux disease)   . Essential hypertension, benign   . Coronary atherosclerosis of native coronary artery     Remote MI 2004 s/p LAD PCI in [place], s/p DES to mid LAD for ISR 01/2010, s/p cutting balloon PCI to prox LAD for ISR 11/16/11.  . Ischemic cardiomyopathy     LVEF 40-45% with apical akinesis with scar as well as anterolateral HK 02/2011, LVEF 50% at cath 06/2013.  . Sinus bradycardia     Metoprolol stopped 11/2011 due to symptomatic bradycardia.  . Syncope     Status post bradycardia on high-dose blocker therapy  . CKD (chronic kidney disease), stage II     Past Surgical History  Procedure Laterality Date  . Shoulder surgery      Right  . Elbow surgery      Left  . Nose surgery      Tumors  . Laparoscopic cholecystectomy  12/23/13    Dr. Laurel Dimmereginald Cathey,MD  . Left and right heart catheterization with coronary angiogram  06/09/2011    Procedure: LEFT AND RIGHT HEART CATHETERIZATION WITH CORONARY ANGIOGRAM;  Surgeon: Herby Abrahamhomas D Stuckey, MD;  [facility];  Service: Cardiovascular;;  . Left heart catheterization with coronary angiogram N/A 11/16/2011    Procedure: LEFT HEART CATHETERIZATION WITH CORONARY ANGIOGRAM;  Surgeon: Freida Busmanalton  Alford HighlandS McLean, MD;  [facility];  Service: Cardiovascular;  Laterality: N/A;  . Left heart catheterization with coronary angiogram N/A 06/24/2013    Procedure: LEFT HEART CATHETERIZATION WITH CORONARY ANGIOGRAM;  Surgeon: Rollene RotundaJames Hochrein, MD;  [facility];  Service: Cardiovascular;  Laterality: N/A;    Social History Mr. Neale BurlyFreeman reports that he has never smoked. He has never used smokeless tobacco. Mr. Neale BurlyFreeman reports that he does not drink alcohol.  Review of Systems Complete review of systems negative except as otherwise outlined in the clinical summary and also the following. No palpitations, dizziness, or syncope. No reported bleeding episodes.  Physical Examination Filed Vitals:    07/06/14 1134  BP: 117/70  Pulse: 62   Filed Weights   07/06/14 1134  Weight: 210 lb (95.255 kg)    Appears comfortable at rest. HEENT: Conjunctiva and lids normal, oropharynx clear.  Neck: Supple, no elevated JVP or carotid bruits, no thyromegaly.  Lungs: Clear to auscultation, nonlabored breathing at rest.  Cardiac: Regular rate and rhythm, no S3 or significant systolic murmur.  Abdomen: Soft, nontender, bowel sounds present. Healing keyhole incisions. Extremities: No pitting edema, distal pulses 2+.  Skin: Warm and dry. Tanned. Musculoskeletal: No kyphosis.  Neuropsychiatric: Alert and oriented x3, affect grossly appropriate.   Problem List and Plan   Coronary atherosclerosis of native coronary artery Symptomatically stable on current medical regimen. Heart catheterization from last year noted above. No changes for now, continue observation.  Essential hypertension, benign Blood pressure is normal today.  Mixed hyperlipidemia Continues on Pravachol, keep follow-up with Dr. Willaim BanePark.    Jonelle SidleSamuel G. Elfriede Bonini, M.D., F.A.C.C.

## 2014-07-06 NOTE — Assessment & Plan Note (Signed)
Symptomatically stable on current medical regimen. Heart catheterization from last year noted above. No changes for now, continue observation.

## 2014-07-06 NOTE — Assessment & Plan Note (Signed)
Continues on Pravachol, keep follow-up with Dr. Willaim BanePark.

## 2014-08-14 ENCOUNTER — Other Ambulatory Visit: Payer: Self-pay | Admitting: Cardiology

## 2014-10-27 ENCOUNTER — Telehealth: Payer: Self-pay | Admitting: Cardiology

## 2014-10-27 NOTE — Telephone Encounter (Signed)
Should stop aspirin and Plavix 7 days prior to planned surgery. Typically these medications can be resumed within a few days postoperatively depending on hemostasis and any other bleeding concerns.

## 2014-10-27 NOTE — Telephone Encounter (Signed)
Needs to know when patient can stop Plavix and ASA prior to surgery which is Left Carpel Tunnel Surgery to be done 4/19. And also when to start back post op. / tg

## 2014-10-27 NOTE — Telephone Encounter (Signed)
Steven Haas informed and information faxed to [phone].

## 2014-11-12 ENCOUNTER — Other Ambulatory Visit: Payer: Self-pay | Admitting: Cardiology

## 2014-12-16 ENCOUNTER — Encounter: Payer: Self-pay | Admitting: Cardiology

## 2014-12-16 ENCOUNTER — Ambulatory Visit (INDEPENDENT_AMBULATORY_CARE_PROVIDER_SITE_OTHER): Payer: 59 | Admitting: Cardiology

## 2014-12-16 VITALS — BP 138/67 | HR 57 | Ht 76.0 in | Wt 204.8 lb

## 2014-12-16 DIAGNOSIS — I2510 Atherosclerotic heart disease of native coronary artery without angina pectoris: Secondary | ICD-10-CM

## 2014-12-16 DIAGNOSIS — E782 Mixed hyperlipidemia: Secondary | ICD-10-CM

## 2014-12-16 DIAGNOSIS — I255 Ischemic cardiomyopathy: Secondary | ICD-10-CM | POA: Diagnosis not present

## 2014-12-16 NOTE — Progress Notes (Signed)
Cardiology Office Note  Date: 12/16/2014   ID: Steven CarlsJames E Brusseau, DOB 12/25/1950, MRN [id number]  PCP: Ardyth ManPARK,CHAN M, MD  Primary Cardiologist: Nona DellSamuel McDowell, MD   Chief Complaint  Patient presents with  . Coronary Artery Disease  . Cardiomyopathy    History of Present Illness: Steven Haas is a 64 y.o. male last seen in December 2015. He presents today with his wife for a follow-up visit. From a cardiac perspective, he continues to do well without angina symptoms or shortness of breath beyond NYHA class II. He has not required any nitroglycerin. Follow-up ECGs reviewed below showing stable findings.  He reports compliance with his medications, has follow-up with Dr. Willaim BanePark in July for repeat lipid assessment on Pravachol.  Most recent cardiac catheterization as noted below.   Past Medical History  Diagnosis Date  . Type 2 diabetes mellitus   . GERD (gastroesophageal reflux disease)   . Essential hypertension, benign   . Coronary atherosclerosis of native coronary artery     Remote MI 2004 s/p LAD PCI in [place], s/p DES to mid LAD for ISR 01/2010, s/p cutting balloon PCI to prox LAD for ISR 11/16/11.  . Ischemic cardiomyopathy     LVEF 40-45% with apical akinesis with scar as well as anterolateral HK 02/2011, LVEF 50% at cath 06/2013.  . Sinus bradycardia     Metoprolol stopped 11/2011 due to symptomatic bradycardia.  . Syncope     Status post bradycardia on high-dose blocker therapy  . CKD (chronic kidney disease), stage II      Current Outpatient Prescriptions  Medication Sig Dispense Refill  . acetaminophen (TYLENOL) 500 MG tablet Take 500 mg by mouth every 6 (six) hours as needed.    Marland Kitchen. aspirin 81 MG tablet Take 81 mg by mouth daily.    . clopidogrel (PLAVIX) 75 MG tablet Take 75 mg by mouth daily.      . furosemide (LASIX) 40 MG tablet Take 40 mg by mouth every morning.     Marland Kitchen. NITROSTAT 0.4 MG SL tablet PLAEC 1 TAB UNDER TONGUE EVERY 5 MINS AS NEEDED FOR CHEST  PAIN**MAX 3TABS/IN 15 MIN** 25 tablet 2  . pantoprazole (PROTONIX) 40 MG tablet TAKE 1 TABLET BY MOUTH EVERY DAY AT 12 NOON 90 tablet 0  . pioglitazone-glimepiride (DUETACT) 30-2 MG per tablet Take 1 tablet by mouth daily.      . pravastatin (PRAVACHOL) 40 MG tablet Take 40 mg by mouth at bedtime.     . ramipril (ALTACE) 10 MG capsule TAKE 2 CAPSULES BY MOUTH EVERY DAY (Patient taking differently: TAKE 2 CAPSULES BY MOUTH EVERY MORNING) 180 capsule 3  . Tamsulosin HCl (FLOMAX) 0.4 MG CAPS Take 0.4 mg by mouth daily.      No current facility-administered medications for this visit.    Allergies:  Review of patient's allergies indicates no known allergies.   Social History: The patient  reports that he has never smoked. He has never used smokeless tobacco. He reports that he does not drink alcohol or use illicit drugs.   ROS:  Please see the history of present illness. Otherwise, complete review of systems is positive for limited range of motion of left wrist, continues undergoing rehabilitation after injury last year and subsequent surgeries.  All other systems are reviewed and negative.   Physical Exam: VS:  BP 138/67 mmHg  Pulse 57  Ht 6\' 4"  (1.93 m)  Wt 204 lb 12.8 oz (92.897 kg)  BMI 24.94 kg/m2  SpO2 100%, BMI Body mass index is 24.94 kg/(m^2).  Wt Readings from Last 3 Encounters:  12/16/14 204 lb 12.8 oz (92.897 kg)  07/06/14 210 lb (95.255 kg)  01/02/14 213 lb (96.616 kg)     Appears comfortable at rest. HEENT: Conjunctiva and lids normal, oropharynx clear.  Neck: Supple, no elevated JVP or carotid bruits, no thyromegaly.  Lungs: Clear to auscultation, nonlabored breathing at rest.  Cardiac: Regular rate and rhythm, no S3 or significant systolic murmur.  Abdomen: Soft, nontender, bowel sounds present. Extremities: No pitting edema, distal pulses 2+.  Skin: Warm and dry. Musculoskeletal: No kyphosis.  Neuropsychiatric: Alert and oriented x3, affect grossly  appropriate.   ECG: ECG is ordered today and shows sinus rhythm with old anterior infarct pattern.   Other Studies Reviewed Today:  Most recent cardiac catheterization was in December 2014 demonstrating patent LAD stent site with otherwise nonobstructive residual CAD, and LVEF estimated at 50% with apical hypokinesis.  Assessment and Plan:  1. Symptomatically stable CAD status post previous anterior infarct with interventions to the LAD as outlined above. Most recent cardiac catheterization within the last 2 years revealed patent stent sites. Plan is to continue medical therapy and observation.  2. Hyperlipidemia, on Pravachol. Keep follow-up with Dr. Willaim Bane for repeat lab work.  3. Ischemic cardiomyopathy with LVEF approximately 45-50%, no active heart failure symptoms. He is currently not on beta blocker with history of symptomatic bradycardia in the past.  Current medicines were reviewed with the patient today.   Orders Placed This Encounter  Procedures  . EKG 12-Lead    Disposition: FU with me in 6 months.   Signed, Jonelle Sidle, MD, [facility] 12/16/2014 2:09 PM    [facility] at [facility] [address] [place], [place], [place] [address] Phone: [phone]; Fax: [phone]

## 2014-12-16 NOTE — Patient Instructions (Signed)
Your physician recommends that you continue on your current medications as directed. Please refer to the Current Medication list given to you today. Your physician recommends that you schedule a follow-up appointment in: 6 months. You will receive a reminder letter in the mail in about 4 months reminding you to call and schedule your appointment. If you don't receive this letter, please contact our office. 

## 2015-02-14 ENCOUNTER — Other Ambulatory Visit: Payer: Self-pay | Admitting: Cardiology

## 2015-06-08 ENCOUNTER — Ambulatory Visit (INDEPENDENT_AMBULATORY_CARE_PROVIDER_SITE_OTHER): Payer: 59 | Admitting: Cardiology

## 2015-06-08 ENCOUNTER — Encounter: Payer: Self-pay | Admitting: Cardiology

## 2015-06-08 VITALS — BP 129/85 | HR 63 | Ht 76.0 in | Wt 207.0 lb

## 2015-06-08 DIAGNOSIS — I10 Essential (primary) hypertension: Secondary | ICD-10-CM

## 2015-06-08 DIAGNOSIS — I255 Ischemic cardiomyopathy: Secondary | ICD-10-CM

## 2015-06-08 DIAGNOSIS — I2510 Atherosclerotic heart disease of native coronary artery without angina pectoris: Secondary | ICD-10-CM

## 2015-06-08 NOTE — Patient Instructions (Signed)
Your physician recommends that you continue on your current medications as directed. Please refer to the Current Medication list given to you today. Your physician recommends that you schedule a follow-up appointment in: 6 months. You will receive a reminder letter in the mail in about 4 months reminding you to call and schedule your appointment. If you don't receive this letter, please contact our office. 

## 2015-06-08 NOTE — Progress Notes (Signed)
Cardiology Office Note  Date: 06/08/2015   ID: Steven Haas, DOB 1951/02/28, MRN [id number]  PCP: [name], MD  Primary Cardiologist: Nona Dell, MD   Chief Complaint  Patient presents with  . Coronary Artery Disease  . Cardiomyopathy    History of Present Illness: Steven Haas is a 64 y.o. male last seen in June. He presents for a routine cardiac visit. Denies any frank angina symptoms, but states that he has been having a recurring upper left arm discomfort with some radiation into his chest. He notices this at night sometimes, less so during the day when he is active. He thinks it may be related to his prior left arm fracture and subsequent surgery. He has tried nitroglycerin without any positive effect.  Follow-up ECG done today shows sinus rhythm and old anterior infarct pattern, similar to prior tracing. We reviewed his medications which are outlined below. He does not report any significant changes in cardiac regimen.  Cardiac catheterization from within the last 2 years is outlined below. He remains comfortable with medical therapy and observation for now.    Past Medical History  Diagnosis Date  . Type 2 diabetes mellitus (HCC)   . GERD (gastroesophageal reflux disease)   . Essential hypertension, benign   . Coronary atherosclerosis of native coronary artery     Remote MI 2004 s/p LAD PCI in [place], s/p DES to mid LAD for ISR 01/2010, s/p cutting balloon PCI to prox LAD for ISR 11/16/11.  . Ischemic cardiomyopathy     LVEF 40-45% with apical akinesis with scar as well as anterolateral HK 02/2011, LVEF 50% at cath 06/2013.  . Sinus bradycardia     Metoprolol stopped 11/2011 due to symptomatic bradycardia.  . Syncope     Status post bradycardia on high-dose blocker therapy  . CKD (chronic kidney disease), stage II     Current Outpatient Prescriptions  Medication Sig Dispense Refill  . acetaminophen (TYLENOL) 500 MG tablet Take 500 mg by mouth every 6  (six) hours as needed.    Marland Kitchen aspirin 81 MG tablet Take 81 mg by mouth daily.    . clopidogrel (PLAVIX) 75 MG tablet Take 75 mg by mouth daily.      . furosemide (LASIX) 40 MG tablet Take 40 mg by mouth every morning.     Marland Kitchen NITROSTAT 0.4 MG SL tablet PLAEC 1 TAB UNDER TONGUE EVERY 5 MINS AS NEEDED FOR CHEST PAIN**MAX 3TABS/IN 15 MIN** 25 tablet 2  . pantoprazole (PROTONIX) 40 MG tablet TAKE 1 TABLET BY MOUTH EVERY DAY AT 12 NOON 90 tablet 3  . pioglitazone-glimepiride (DUETACT) 30-2 MG per tablet Take 1 tablet by mouth daily.      . pravastatin (PRAVACHOL) 40 MG tablet Take 40 mg by mouth at bedtime.     . ramipril (ALTACE) 10 MG capsule TAKE 2 CAPSULES BY MOUTH EVERY DAY (Patient taking differently: TAKE 2 CAPSULES BY MOUTH EVERY MORNING) 180 capsule 3  . Tamsulosin HCl (FLOMAX) 0.4 MG CAPS Take 0.4 mg by mouth daily.      No current facility-administered medications for this visit.    Allergies:  Review of patient's allergies indicates no known allergies.   Social History: The patient  reports that he has never smoked. He has never used smokeless tobacco. He reports that he does not drink alcohol or use illicit drugs.   ROS:  Please see the history of present illness. Otherwise, complete review of systems is positive for NYHA  class II dyspnea.  All other systems are reviewed and negative.   Physical Exam: VS:  BP 129/85 mmHg  Pulse 63  Ht 6\' 4"  (1.93 m)  Wt 207 lb (93.895 kg)  BMI 25.21 kg/m2  SpO2 100%, BMI Body mass index is 25.21 kg/(m^2).  Wt Readings from Last 3 Encounters:  06/08/15 207 lb (93.895 kg)  12/16/14 204 lb 12.8 oz (92.897 kg)  07/06/14 210 lb (95.255 kg)     Appears comfortable at rest. HEENT: Conjunctiva and lids normal, oropharynx clear.  Neck: Supple, no elevated JVP or carotid bruits, no thyromegaly.  Lungs: Clear to auscultation, nonlabored breathing at rest.  Cardiac: Regular rate and rhythm, no S3 or significant systolic murmur.  Abdomen: Soft,  nontender, bowel sounds present. Extremities: No pitting edema, distal pulses 2+. Reports limited range of motion and stiffness in his left forearm and hand   ECG: Sinus bradycardia with old anterior infarct pattern.   Recent Labwork:  April 2016: Potassium 4.3, BUN 15, creatinine 1.3  Other Studies Reviewed Today:  Most recent cardiac catheterization was in December 2014 demonstrating patent LAD stent site with otherwise nonobstructive residual CAD, and LVEF estimated at 50% with apical hypokinesis.  Assessment and Plan:  1. Coronary artery disease status post previous percutaneous interventions as outlined above. Cardiac catheterization within the last 2 years showed patent LAD stent site with otherwise nonobstructive disease. He has had some atypical upper left arm discomfort as outlined above, not responsive to nitroglycerin. ECG is stable. We have discussed the situation, and he remains comfortable with observation on medical therapy for now.  2. Ischemic cardiomyopathy, LVEF 50% with apical hypokinesis by LV gram in 2014.  3. Essential hypertension, no changes made to current regimen.  Current medicines were reviewed with the patient today.   Orders Placed This Encounter  Procedures  . EKG 12-Lead    Disposition: FU with me in 6 months.   Signed, Jonelle SidleSamuel G. Ariston Grandison, MD, [facility] 06/08/2015 3:29 PM    [facility] [address] [place], [place], [place] [address] Phone: [phone]; Fax: [phone]

## 2015-08-16 ENCOUNTER — Other Ambulatory Visit: Payer: Self-pay | Admitting: Cardiology

## 2015-11-29 ENCOUNTER — Ambulatory Visit (INDEPENDENT_AMBULATORY_CARE_PROVIDER_SITE_OTHER): Payer: BLUE CROSS/BLUE SHIELD | Admitting: Cardiology

## 2015-11-29 ENCOUNTER — Encounter: Payer: Self-pay | Admitting: *Deleted

## 2015-11-29 ENCOUNTER — Encounter: Payer: Self-pay | Admitting: Cardiology

## 2015-11-29 VITALS — BP 122/78 | HR 55 | Ht 76.0 in | Wt 205.0 lb

## 2015-11-29 DIAGNOSIS — I2510 Atherosclerotic heart disease of native coronary artery without angina pectoris: Secondary | ICD-10-CM

## 2015-11-29 DIAGNOSIS — I429 Cardiomyopathy, unspecified: Secondary | ICD-10-CM | POA: Diagnosis not present

## 2015-11-29 DIAGNOSIS — I10 Essential (primary) hypertension: Secondary | ICD-10-CM | POA: Diagnosis not present

## 2015-11-29 DIAGNOSIS — E782 Mixed hyperlipidemia: Secondary | ICD-10-CM

## 2015-11-29 DIAGNOSIS — R001 Bradycardia, unspecified: Secondary | ICD-10-CM

## 2015-11-29 NOTE — Progress Notes (Signed)
Cardiology Office Note  Date: 11/29/2015   ID: Steven Haas, DOB 11/13/1950, MRN [id number]  PCP: Ardyth ManPARK,CHAN M, MD  Primary Cardiologist: Nona DellSamuel McDowell, MD   Chief Complaint  Patient presents with  . Coronary Artery Disease  . Cardiomyopathy    History of Present Illness: Steven Haas is a 65 y.o. male last seen in November 2016. He presents for a routine follow-up visit. 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 He does not report any angina symptoms and has NYHA class 1-2 dyspnea.  We reviewed his medications which are outlined below. He reports no significant changes in cardiac regimen. No bleeding problems on DAPT.  Cardiac catheterization from 2014 is outlined below. He has not had a recent follow-up echocardiogram.  He reports having had lab work with Dr. Willaim BanePark recently.  Past Medical History  Diagnosis Date  . Type 2 diabetes mellitus (HCC)   . GERD (gastroesophageal reflux disease)   . Essential hypertension, benign   . Coronary atherosclerosis of native coronary artery     Remote MI 2004 s/p LAD PCI in [place], s/p DES to mid LAD for ISR 01/2010, s/p cutting balloon PCI to prox LAD for ISR 11/16/11.  . Ischemic cardiomyopathy     LVEF 40-45% with apical akinesis with scar as well as anterolateral HK 02/2011, LVEF 50% at cath 06/2013.  . Sinus bradycardia     Metoprolol stopped 11/2011 due to symptomatic bradycardia.  . Syncope     Status post bradycardia on high-dose blocker therapy  . CKD (chronic kidney disease), stage II     Past Surgical History  Procedure Laterality Date  . Shoulder surgery      Right  . Elbow surgery      Left  . Nose surgery      Tumors  . Laparoscopic cholecystectomy  12/23/13    Dr. Laurel Dimmereginald Cathey,MD  . Left and right heart catheterization with coronary angiogram  06/09/2011    Procedure: LEFT AND RIGHT HEART CATHETERIZATION WITH CORONARY ANGIOGRAM;  Surgeon: Herby Abrahamhomas D  Stuckey, MD;  [facility];  Service: Cardiovascular;;  . Left heart catheterization with coronary angiogram N/A 11/16/2011    Procedure: LEFT HEART CATHETERIZATION WITH CORONARY ANGIOGRAM;  Surgeon: Laurey Moralealton S McLean, MD;  [facility];  Service: Cardiovascular;  Laterality: N/A;  . Left heart catheterization with coronary angiogram N/A 06/24/2013    Procedure: LEFT HEART CATHETERIZATION WITH CORONARY ANGIOGRAM;  Surgeon: Rollene RotundaJames Hochrein, MD;  [facility];  Service: Cardiovascular;  Laterality: N/A;    Current Outpatient Prescriptions  Medication Sig Dispense Refill  . acetaminophen (TYLENOL) 500 MG tablet Take 500 mg by mouth every 6 (six) hours as needed.    Marland Kitchen. aspirin 81 MG tablet Take 81 mg by mouth daily.    . clopidogrel (PLAVIX) 75 MG tablet Take 75 mg by mouth daily.      . furosemide (LASIX) 40 MG tablet Take 40 mg by mouth every morning.     Marland Kitchen. glimepiride (AMARYL) 2 MG tablet Take 2 mg by mouth daily with breakfast.    . NITROSTAT 0.4 MG SL tablet PLAEC 1 TAB UNDER TONGUE EVERY 5 MINS AS NEEDED FOR CHEST PAIN**MAX 3TABS/IN 15 MIN** 25 tablet 2  . pantoprazole (PROTONIX) 40 MG tablet TAKE 1 TABLET BY MOUTH EVERY DAY AT 12 NOON 90 tablet 3  . pioglitazone (ACTOS) 30 MG tablet Take 30 mg by mouth daily.    .Marland Kitchen  pravastatin (PRAVACHOL) 40 MG tablet Take 40 mg by mouth at bedtime.     . ramipril (ALTACE) 10 MG capsule TAKE 2 CAPSULES BY MOUTH EVERY DAY 90 capsule 3  . Tamsulosin HCl (FLOMAX) 0.4 MG CAPS Take 0.4 mg by mouth daily.      No current facility-administered medications for this visit.   Allergies:  Review of patient's allergies indicates no known allergies.   Social History: The patient  reports that he has never smoked. He has never used smokeless tobacco. He reports that he does not drink alcohol or use illicit drugs.   ROS:  Please see the history of present illness. Otherwise, complete review of systems is positive for arthritic stiffness.  All other  systems are reviewed and negative.   Physical Exam: VS:  BP 122/78 mmHg  Pulse 55  Ht  (1.93 m)  Wt 205 lb (92.987 kg)  BMI 24.96 kg/m2  SpO2 98%, BMI Body mass index is 24.96 kg/(m^2).  Wt Readings from Last 3 Encounters:  11/29/15 205 lb (92.987 kg)  06/08/15 207 lb (93.895 kg)  12/16/14 204 lb 12.8 oz (92.897 kg)    Appears comfortable at rest. HEENT: Conjunctiva and lids normal, oropharynx clear.  Neck: Supple, no elevated JVP or carotid bruits, no thyromegaly.  Lungs: Clear to auscultation, nonlabored breathing at rest.  Cardiac: Regular rate and rhythm, no S3 or significant systolic murmur.  Abdomen: Soft, nontender, bowel sounds present. Extremities: No pitting edema, distal pulses 2+. Skin: Warm and dry, tanned. Musculoskeletal: No kyphosis. Neuropsychiatric: Alert and oriented 3, affect appropriate.  ECG: I personally reviewed the prior tracing from 06/08/2015 which showed sinus rhythm with old anterior infarct pattern.  Recent Labwork:  April 2016: Potassium 4.3, BUN 15, creatinine 1.3  Other Studies Reviewed Today:  Cardiac catheterization 06/24/2013: Left mainstem: Normal.   Left anterior descending (LAD): Proximal stent with mild diffuse luminal irregularities. D1 - D3 small and normal.   Left circumflex (LCx): AV groove is very large. AV groove with diffuse 25% plaque. RI small/moderate and normal. MOM large and branching with inferior long 25% stenosis. PDA is large/moderate sized with mild luminal irregularities.  Right coronary artery (RCA): Small. Nondominant mild luminal irregularities.  Left ventriculography: Left ventricular systolic function is mildly reduced, LVEF is estimated at 50 with apical hypokinesis, there is no significant mitral regurgitation   Final Conclusions: Patent LAD stent with nonobstructive residual CAD. Mild LV dysfunction.  Assessment and Plan:  1. Symptomatically stable CAD status post previous LAD  interventions as detailed above. Cardiac catheterization from 2014 reviewed. We will continue medical therapy and observation.  2. Ischemic cardiomyopathy, LVEF 45-50% based on prior assessment. Follow-up echocardiogram will be obtained.  3. History of symptomatic bradycardia, not able to tolerate beta blockers.  4. Hyperlipidemia, on Pravachol. Requesting most recent lab work from Dr. Willaim Bane.  5. Essential hypertension, blood pressure is adequately controlled today.  Current medicines were reviewed with the patient today.   Orders Placed This Encounter  Procedures  . ECHOCARDIOGRAM COMPLETE    Disposition: FU with me in 6 months.   Signed, Jonelle Sidle, MD, [facility] 11/29/2015 8:15 AM    [facility] at [facility] [address] [place], [place], [place] [address] Phone: [phone]; Fax: [phone]

## 2015-11-29 NOTE — Patient Instructions (Signed)
   Your physician has requested that you have an echocardiogram. Echocardiography is a painless test that uses sound waves to create images of your heart. It provides your doctor with information about the size and shape of your heart and how well your heart's chambers and valves are working. This procedure takes approximately one hour. There are no restrictions for this procedure. Office will contact with results via phone or letter.   Continue all current medications. Your physician wants you to follow up in: 6 months.  You will receive a reminder letter in the mail one-two months in advance.  If you don't receive a letter, please call our office to schedule the follow up appointment     

## 2015-11-30 ENCOUNTER — Encounter: Payer: Self-pay | Admitting: Cardiology

## 2015-12-30 ENCOUNTER — Ambulatory Visit (INDEPENDENT_AMBULATORY_CARE_PROVIDER_SITE_OTHER): Payer: BLUE CROSS/BLUE SHIELD

## 2015-12-30 ENCOUNTER — Other Ambulatory Visit: Payer: Self-pay

## 2015-12-30 DIAGNOSIS — I429 Cardiomyopathy, unspecified: Secondary | ICD-10-CM

## 2015-12-30 LAB — ECHOCARDIOGRAM COMPLETE
CHL CUP DOP CALC LVOT VTI: 24.6 cm
CHL CUP MV DEC (S): 106
E decel time: 106 msec
E/e' ratio: 8.17
FS: 23 % — AB (ref 28–44)
IVS/LV PW RATIO, ED: 0.5
LA diam end sys: 44 mm
LA diam index: 1.97 cm/m2
LA vol A4C: 87.1 ml
LASIZE: 44 mm
LAVOL: 76.6 mL
LAVOLIN: 34.2 mL/m2
LDCA: 3.46 cm2
LV E/e' medial: 8.17
LV dias vol index: 58 mL/m2
LV dias vol: 130 mL (ref 62–150)
LV e' LATERAL: 9.38 cm/s
LV sys vol: 64 mL — AB (ref 21–61)
LVEEAVG: 8.17
LVOT diameter: 21 mm
LVOT peak grad rest: 5 mmHg
LVOTPV: 116 cm/s
LVOTSV: 85 mL
LVSYSVOLIN: 29 mL/m2
MV Peak grad: 2 mmHg
MV pk A vel: 83.9 m/s
MV pk E vel: 76.6 m/s
PW: 11.2 mm — AB (ref 0.6–1.1)
RV TAPSE: 24.3 mm
Simpson's disk: 51
Stroke v: 66 ml
TDI e' lateral: 9.38
TDI e' medial: 8.7

## 2016-01-03 ENCOUNTER — Telehealth: Payer: Self-pay | Admitting: *Deleted

## 2016-01-03 NOTE — Telephone Encounter (Signed)
-----   Message from Jonelle SidleSamuel G McDowell, MD sent at 12/31/2015  2:29 PM EDT ----- Results reviewed. LVEF 40-45% with wall motion abnormalities consistent with CAD history. We will continue medical therapy and observation. A copy of this test should be forwarded to Ardyth ManPARK,CHAN M, MD.

## 2016-01-03 NOTE — Telephone Encounter (Signed)
Patient informed and copy sent to PCP. 

## 2016-01-25 ENCOUNTER — Other Ambulatory Visit: Payer: Self-pay | Admitting: *Deleted

## 2016-01-25 MED ORDER — FUROSEMIDE 40 MG PO TABS: 40.0000 mg | ORAL_TABLET | Freq: Every morning | ORAL | Status: DC

## 2016-01-25 MED ORDER — PANTOPRAZOLE SODIUM 40 MG PO TBEC: DELAYED_RELEASE_TABLET | ORAL | Status: DC

## 2016-01-25 MED ORDER — RAMIPRIL 10 MG PO CAPS: 20.0000 mg | ORAL_CAPSULE | Freq: Every day | ORAL | Status: DC

## 2016-01-25 NOTE — Telephone Encounter (Signed)
Refill request received from [facility] for furosemide 20 mg. Chart review has patient on 40 mg and this is what was sent to the pharmacy today.

## 2016-02-02 ENCOUNTER — Other Ambulatory Visit: Payer: Self-pay | Admitting: Cardiology

## 2016-02-10 ENCOUNTER — Other Ambulatory Visit: Payer: Self-pay

## 2016-02-10 MED ORDER — FUROSEMIDE 40 MG PO TABS: 40.0000 mg | ORAL_TABLET | Freq: Every morning | ORAL | 3 refills | Status: DC

## 2016-05-22 ENCOUNTER — Encounter: Payer: Self-pay | Admitting: *Deleted

## 2016-05-22 NOTE — Progress Notes (Signed)
Cardiology Office Note  Date: 05/23/2016   ID: Steven Haas, DOB 11/15/1950, MRN [id number]  PCP: Ardyth ManPARK,CHAN M, MD  Primary Cardiologist: Nona DellSamuel Hillis Mcphatter, MD   Chief Complaint  Patient presents with  . Coronary Artery Disease  . Cardiomyopathy    History of Present Illness: Steven CarlsJames E Haas is a 65 y.o. male last seen in May. He presents for a follow-up visit, states that [facility], had negative cardiac markers for ACS. He reports that within the last 2 weeks he has been experiencing recurring episodes of chest tightness similar to prior angina. Also short of breath with activity, wakes up at night time with some of the symptoms as well. He has had recent stressors in his life, two friends have died recently. Otherwise reports compliance with his medications. [facility].  Last cardiac catheterization was in 2014 as outlined below. LAD stent site was patent and he had mild atherosclerosis otherwise.  Follow-up echocardiogram from June showed LVEF in the range of 40-45%.  Current cardiac regimen includes aspirin, Plavix, Lasix, Ismo, Altace, Pravachol, and as needed nitroglycerin.  Past Medical History:  Diagnosis Date  . CKD (chronic kidney disease), stage II   . Coronary atherosclerosis of native coronary artery    Remote MI 2004 s/p LAD PCI in [place], s/p DES to mid LAD for ISR 01/2010, s/p cutting balloon PCI to prox LAD for ISR 11/16/11.  . Essential hypertension, benign   . GERD (gastroesophageal reflux disease)   . Ischemic cardiomyopathy    LVEF 40-45% with apical akinesis with scar as well as anterolateral HK 02/2011, LVEF 50% at cath 06/2013.  . Sinus bradycardia    Metoprolol stopped 11/2011 due to symptomatic bradycardia.  . Syncope    Status post bradycardia on high-dose blocker therapy  . Type 2 diabetes mellitus (HCC)     Past Surgical History:  Procedure Laterality Date  . ELBOW SURGERY     Left  .  LAPAROSCOPIC CHOLECYSTECTOMY  12/23/13   Dr. Laurel Dimmereginald Cathey,MD  . LEFT AND RIGHT HEART CATHETERIZATION WITH CORONARY ANGIOGRAM  06/09/2011   Procedure: LEFT AND RIGHT HEART CATHETERIZATION WITH CORONARY ANGIOGRAM;  Surgeon: Herby Abrahamhomas D Stuckey, MD;  [facility];  Service: Cardiovascular;;  . LEFT HEART CATHETERIZATION WITH CORONARY ANGIOGRAM N/A 11/16/2011   Procedure: LEFT HEART CATHETERIZATION WITH CORONARY ANGIOGRAM;  Surgeon: Laurey Moralealton S McLean, MD;  [facility];  Service: Cardiovascular;  Laterality: N/A;  . LEFT HEART CATHETERIZATION WITH CORONARY ANGIOGRAM N/A 06/24/2013   Procedure: LEFT HEART CATHETERIZATION WITH CORONARY ANGIOGRAM;  Surgeon: Rollene RotundaJames Hochrein, MD;  [facility];  Service: Cardiovascular;  Laterality: N/A;  . NOSE SURGERY     Tumors  . SHOULDER SURGERY     Right    Current Outpatient Prescriptions  Medication Sig Dispense Refill  . acetaminophen (TYLENOL) 500 MG tablet Take 500 mg by mouth every 6 (six) hours as needed.    Marland Kitchen. aspirin 81 MG tablet Take 81 mg by mouth daily.    . clopidogrel (PLAVIX) 75 MG tablet Take 75 mg by mouth daily.      . furosemide (LASIX) 40 MG tablet Take 1 tablet (40 mg total) by mouth every morning. 90 tablet 3  . glimepiride (AMARYL) 2 MG tablet Take 2 mg by mouth daily with breakfast.    . isosorbide mononitrate (ISMO,MONOKET) 20 MG tablet Take 20 mg by mouth at bedtime.     .Marland Kitchen  NITROSTAT 0.4 MG SL tablet PLAEC 1 TAB UNDER TONGUE EVERY 5 MINS AS NEEDED FOR CHEST PAIN**MAX 3TABS/IN 15 MIN** 25 tablet 2  . pantoprazole (PROTONIX) 40 MG tablet TAKE 1 TABLET BY MOUTH EVERY DAY AT 12 NOON 90 tablet 3  . pioglitazone (ACTOS) 30 MG tablet Take 30 mg by mouth daily.    . pravastatin (PRAVACHOL) 40 MG tablet Take 40 mg by mouth at bedtime.     . ramipril (ALTACE) 10 MG capsule Take 2 capsules (20 mg total) by mouth daily. 180 capsule 3  . Tamsulosin HCl (FLOMAX) 0.4 MG CAPS Take 0.4 mg by mouth daily.      No current  facility-administered medications for this visit.    Allergies:  Patient has no known allergies.   Social History: The patient  reports that he has never smoked. He has never used smokeless tobacco. He reports that he does not drink alcohol or use drugs.   Family History: The patient's family history includes Heart attack in his father; Hypertension in his sister, sister, sister, and sister; Stroke in his father.   ROS:  Please see the history of present illness. Otherwise, complete review of systems is positive for chronic right shoulder pain - being considered for right shoulder surgery.  All other systems are reviewed and negative.   Physical Exam: VS:  BP 123/78   Pulse 82   Ht 6\' 4"  (1.93 m)   Wt 210 lb (95.3 kg)   SpO2 99%   BMI 25.56 kg/m , BMI Body mass index is 25.56 kg/m.  Wt Readings from Last 3 Encounters:  05/23/16 210 lb (95.3 kg)  11/29/15 205 lb (93 kg)  06/08/15 207 lb (93.9 kg)    Appears comfortable at rest. HEENT: Conjunctiva and lids normal, oropharynx clear.  Neck: Supple, no elevated JVP or carotid bruits, no thyromegaly.  Lungs: Clear to auscultation, nonlabored breathing at rest.  Cardiac: Regular rate and rhythm, no S3 or significant systolic murmur.  Abdomen: Soft, nontender, bowel sounds present. Extremities: No pitting edema, distal pulses 2+. Skin: Warm and dry, tanned. Musculoskeletal: No kyphosis. Neuropsychiatric: Alert and oriented 3, affect appropriate.  ECG: I personally reviewed the tracing from 06/08/2015 which showed sinus rhythm with old anterior infarct pattern.  Recent Labwork:  April 2017: BUN 21, creatinine 1.2, potassium 4.1, AST 17, ALT 17, hemoglobin 14.6, platelets 203, cholesterol 132, triglycerides 102, HDL 39, LDL 73, hemoglobin A1c 9.4, TSH 1.28  Other Studies Reviewed Today:  Cardiac catheterization 06/24/2013: Left mainstem: Normal.   Left anterior descending (LAD): Proximal stent with mild diffuse luminal  irregularities. D1 - D3 small and normal.   Left circumflex (LCx): AV groove is very large. AV groove with diffuse 25% plaque. RI small/moderate and normal. MOM large and branching with inferior long 25% stenosis. PDA is large/moderate sized with mild luminal irregularities.  Right coronary artery (RCA): Small. Nondominant mild luminal irregularities.  Left ventriculography: Left ventricular systolic function is mildly reduced, LVEF is estimated at 50 with apical hypokinesis, there is no significant mitral regurgitation   Final Conclusions: Patent LAD stent with nonobstructive residual CAD. Mild LV dysfunction.  Echocardiogram 12/30/2015: Study Conclusions  - Left ventricle: The cavity size was normal. Wall thickness was   normal. Systolic function was mildly to moderately reduced. The   estimated ejection fraction was in the range of 40% to 45%. Left   ventricular diastolic function parameters were normal. - Regional wall motion abnormality: Akinesis of the mid anterior,   mid  anteroseptal, apical septal, apical lateral, and apical   myocardium. - Aortic valve: Mildly calcified annulus. Trileaflet; mildly   thickened leaflets. Valve area (VTI): 3.22 cm^2. Valve area   (Vmax): 3.19 cm^2. Valve area (Vmean): 2.87 cm^2. - Mitral valve: Mildly calcified annulus. Mildly thickened leaflets. - Left atrium: The atrium was moderately dilated. - Atrial septum: No defect or patent foramen ovale was identified. - Technically adequate study.  Assessment and Plan:  1. Accelerating angina symptoms over the last few weeks. He reports compliance with his medications, had [facility] at which time long-acting nitrate was added. With no substantial improvement in symptoms, we discussed pursuing a diagnostic cardiac catheterization. We discussed the risks and benefits, and he is in agreement to proceed.  2. CAD status post PCI of the LAD in 2004 the setting  of infarct, subsequent DES to the LAD in 2011, and cutting balloon angioplasty to the LAD due to in-stent restenosis in 2013. Stent site was patent and 2014.  3. Ischemic cardiomyopathy with LVEF 40-45%. This has been relatively stable over time.  4. History of CKD stage 2, last outpatient creatinine was 1.2.  5. Essential hypertension, blood pressure is well controlled today.  6. Hyperlipidemia, on Pravachol.  Current medicines were reviewed with the patient today.   Disposition: Follow-up after cardiac catheterization.  Signed, Jonelle SidleSamuel G. Aleida Crandell, MD, [facility] 05/23/2016 9:37 AM    [facility] [address], [place], [place] [address] Phone: [phone]; Fax: [phone]

## 2016-05-23 ENCOUNTER — Ambulatory Visit (INDEPENDENT_AMBULATORY_CARE_PROVIDER_SITE_OTHER): Payer: BLUE CROSS/BLUE SHIELD | Admitting: Cardiology

## 2016-05-23 ENCOUNTER — Encounter: Payer: Self-pay | Admitting: *Deleted

## 2016-05-23 ENCOUNTER — Telehealth: Payer: Self-pay | Admitting: Cardiology

## 2016-05-23 ENCOUNTER — Other Ambulatory Visit: Payer: Self-pay | Admitting: Cardiology

## 2016-05-23 ENCOUNTER — Encounter: Payer: Self-pay | Admitting: Cardiology

## 2016-05-23 VITALS — BP 123/78 | HR 82 | Ht 76.0 in | Wt 210.0 lb

## 2016-05-23 DIAGNOSIS — I255 Ischemic cardiomyopathy: Secondary | ICD-10-CM | POA: Diagnosis not present

## 2016-05-23 DIAGNOSIS — I25709 Atherosclerosis of coronary artery bypass graft(s), unspecified, with unspecified angina pectoris: Secondary | ICD-10-CM

## 2016-05-23 DIAGNOSIS — I200 Unstable angina: Secondary | ICD-10-CM

## 2016-05-23 DIAGNOSIS — I10 Essential (primary) hypertension: Secondary | ICD-10-CM | POA: Diagnosis not present

## 2016-05-23 DIAGNOSIS — E782 Mixed hyperlipidemia: Secondary | ICD-10-CM

## 2016-05-23 NOTE — Telephone Encounter (Signed)
Left heart cath - Dr. Tresa EndoKelly - Thursday, 11/9 - 9:00

## 2016-05-23 NOTE — Telephone Encounter (Signed)
Patient was seen today by Dr Diona BrownerMcDowell.   Patient is aware that we do not accept his insurance and that it will be considered self-pay. He was fine with this stated that he doesn't want to see any other doctor.  He would like to know where he can have cath since [facility] is not in network.

## 2016-05-23 NOTE — Patient Instructions (Signed)
Medication Instructions:  Continue all current medications.  Labwork: none  Testing/Procedures: Your physician has requested that you have a cardiac catheterization. Cardiac catheterization is used to diagnose and/or treat various heart conditions. Doctors may recommend this procedure for a number of different reasons. The most common reason is to evaluate chest pain. Chest pain can be a symptom of coronary artery disease (CAD), and cardiac catheterization can show whether plaque is narrowing or blocking your heart's arteries. This procedure is also used to evaluate the valves, as well as measure the blood flow and oxygen levels in different parts of your heart. For further information please visit www.cardiosmart.org. Please follow instruction sheet, as given.  Follow-Up: 1 month   Any Other Special Instructions Will Be Listed Below (If Applicable).  If you need a refill on your cardiac medications before your next appointment, please call your pharmacy.  

## 2016-05-24 NOTE — Telephone Encounter (Signed)
Pt is Dr. Ival Haas's pt.  Pt seen@[facility]  05-17-16 and referred back to our office.  Pt's insurance is now [employer] [place].  Both [facility] and [facility] are out of network and pt has no out of network benefits.  [facility] [place], Dr. Esperanza SheetsEric Williams, Interventionalist as they are in network.  Pt has appt w/their office tomorrow, 05-25-16@10 :00.  Pt agrees and understands.

## 2016-05-25 ENCOUNTER — Ambulatory Visit (HOSPITAL_COMMUNITY)
Admission: RE | Admit: 2016-05-25 | Payer: BLUE CROSS/BLUE SHIELD | Source: Ambulatory Visit | Admitting: Cardiovascular Disease

## 2016-05-25 ENCOUNTER — Encounter (HOSPITAL_COMMUNITY): Admission: RE | Payer: Self-pay | Source: Ambulatory Visit

## 2016-05-25 SURGERY — LEFT HEART CATH AND CORONARY ANGIOGRAPHY

## 2016-07-03 ENCOUNTER — Ambulatory Visit: Payer: BLUE CROSS/BLUE SHIELD | Admitting: Cardiology

## 2016-12-27 ENCOUNTER — Other Ambulatory Visit: Payer: Self-pay | Admitting: Cardiology

## 2017-03-28 HISTORY — PX: OTHER SURGICAL HISTORY: SHX169

## 2017-12-24 ENCOUNTER — Other Ambulatory Visit: Payer: Self-pay | Admitting: Cardiology

## 2018-05-27 ENCOUNTER — Encounter: Payer: Self-pay | Admitting: *Deleted

## 2018-05-27 ENCOUNTER — Encounter: Payer: Self-pay | Admitting: Cardiology

## 2018-05-27 ENCOUNTER — Ambulatory Visit (INDEPENDENT_AMBULATORY_CARE_PROVIDER_SITE_OTHER): Payer: Medicare Other | Admitting: Cardiology

## 2018-05-27 ENCOUNTER — Telehealth: Payer: Self-pay | Admitting: Cardiology

## 2018-05-27 VITALS — BP 118/70 | HR 73 | Ht 76.0 in | Wt 194.2 lb

## 2018-05-27 DIAGNOSIS — I25119 Atherosclerotic heart disease of native coronary artery with unspecified angina pectoris: Secondary | ICD-10-CM

## 2018-05-27 DIAGNOSIS — I255 Ischemic cardiomyopathy: Secondary | ICD-10-CM | POA: Diagnosis not present

## 2018-05-27 DIAGNOSIS — I10 Essential (primary) hypertension: Secondary | ICD-10-CM

## 2018-05-27 DIAGNOSIS — E782 Mixed hyperlipidemia: Secondary | ICD-10-CM | POA: Diagnosis not present

## 2018-05-27 DIAGNOSIS — I25118 Atherosclerotic heart disease of native coronary artery with other forms of angina pectoris: Secondary | ICD-10-CM | POA: Diagnosis not present

## 2018-05-27 NOTE — Progress Notes (Signed)
Cardiology Office Note  Date: 05/27/2018   ID: Steven Haas, DOB March 11, 1951, MRN [id number]  PCP: [name], MD  Primary Cardiologist: Nona Dell, MD   Chief Complaint  Patient presents with  . Coronary Artery Disease    History of Present Illness: Steven Haas is a 67 y.o. male not seen in the office since November 2017.  He is referred back after recent hospitalization at [facility] with chest pain.  He ruled out for ACS and was reportedly seen by cardiology at that facility with recommendation to follow-up as an outpatient.  No inpatient cardiac testing was performed other than cardiac enzymes and ECG.  Actually, when I last saw him in 2017 I recommended a follow-up diagnostic cardiac catheterization in light worsening angina symptoms.  Based on patient's insurance at that time, the [facility] system was out of network for him and he therefore was referred to the [facility] for further cardiac testing.  He has been following with Dr. Clarene Critchley since that time, was seen in January of this year.  He did undergo a cardiac catheterization in November 2017 through the [facility] which demonstrated a severely calcified stenosis involving the ostium circumflex that was managed with rotational atherectomy and DES.  Previously placed mid LAD stent exhibited 30% in-stent restenosis.  He tells me that he is no longer satisfied with the [facility] practice and has decided to follow-up with [place].  He he is on Medicare.  I personally reviewed his ECG from 05/21/2018 which showed sinus rhythm with frequent PVCs and low voltage with decreased anterior R wave progression, rule out old anterior infarct pattern.  He tells me that the symptoms that brought him in for evaluation included a sharp chest pain that occurred while he was walking in to a bank, subsequently chest tightness.  He did take nitroglycerin with some improvement.  Since then he has had more vague  symptoms.  Past Medical History:  Diagnosis Date  . CKD (chronic kidney disease), stage II   . Coronary atherosclerosis of native coronary artery    Remote MI 2004 s/p LAD PCI in [place], s/p DES to mid LAD for ISR 01/2010, s/p cutting balloon PCI to prox LAD for ISR 11/16/11, DES ostial circumflex 05/2016 [facility]  . Essential hypertension, benign   . GERD (gastroesophageal reflux disease)   . Ischemic cardiomyopathy    LVEF 40-45% with apical akinesis with scar as well as anterolateral HK 02/2011, LVEF 50% at cath 06/2013.  . Sinus bradycardia    Metoprolol stopped 11/2011 due to symptomatic bradycardia.  . Syncope    Status post bradycardia on high-dose blocker therapy  . Type 2 diabetes mellitus (HCC)     Past Surgical History:  Procedure Laterality Date  . ELBOW SURGERY     Left  . LAPAROSCOPIC CHOLECYSTECTOMY  12/23/13   Dr. Laurel Dimmer  . LEFT AND RIGHT HEART CATHETERIZATION WITH CORONARY ANGIOGRAM  06/09/2011   Procedure: LEFT AND RIGHT HEART CATHETERIZATION WITH CORONARY ANGIOGRAM;  Surgeon: Herby Abraham, MD;  Location: [facility] CATH LAB;  Service: Cardiovascular;;  . LEFT HEART CATHETERIZATION WITH CORONARY ANGIOGRAM N/A 11/16/2011   Procedure: LEFT HEART CATHETERIZATION WITH CORONARY ANGIOGRAM;  Surgeon: Laurey Morale, MD;  Location: [facility] CATH LAB;  Service: Cardiovascular;  Laterality: N/A;  . LEFT HEART CATHETERIZATION WITH CORONARY ANGIOGRAM N/A 06/24/2013   Procedure: LEFT HEART CATHETERIZATION WITH CORONARY ANGIOGRAM;  Surgeon: Rollene Rotunda, MD;  Location: [facility] C[facility] LAB;  Service:  Cardiovascular;  Laterality: N/A;  . NOSE SURGERY     Tumors  . SHOULDER SURGERY     Right    Current Outpatient Medications  Medication Sig Dispense Refill  . acetaminophen (TYLENOL) 500 MG tablet Take 500 mg by mouth every 6 (six) hours as needed.    Marland Kitchen aspirin 81 MG tablet Take 81 mg by mouth daily.    . clopidogrel (PLAVIX) 75 MG tablet Take 75 mg by mouth daily.      .  empagliflozin (JARDIANCE) 10 MG TABS tablet Take 10 mg by mouth daily.    . furosemide (LASIX) 40 MG tablet Take 1 tablet (40 mg total) by mouth every morning. 90 tablet 3  . isosorbide mononitrate (ISMO,MONOKET) 20 MG tablet Take 20 mg by mouth at bedtime.     Marland Kitchen NITROSTAT 0.4 MG SL tablet PLAEC 1 TAB UNDER TONGUE EVERY 5 MINS AS NEEDED FOR CHEST PAIN**MAX 3TABS/IN 15 MIN** 25 tablet 2  . pantoprazole (PROTONIX) 40 MG tablet TAKE 1 TABLET DAILY AT 12 NOON 90 tablet 3  . pravastatin (PRAVACHOL) 40 MG tablet Take 40 mg by mouth at bedtime.     . ramipril (ALTACE) 10 MG capsule TAKE 2 CAPSULES DAILY 180 capsule 3  . Tamsulosin HCl (FLOMAX) 0.4 MG CAPS Take 0.4 mg by mouth daily.      No current facility-administered medications for this visit.    Allergies:  Patient has no known allergies.   Social History: The patient  reports that he has never smoked. He has never used smokeless tobacco. He reports that he does not drink alcohol or use drugs.   Family History: The patient's family history includes Heart attack in his father; Hypertension in his sister, sister, sister, and sister; Stroke in his father.   ROS:  Please see the history of present illness. Otherwise, complete review of systems is positive for none.  All other systems are reviewed and negative.   Physical Exam: VS:  BP 118/70   Pulse 73   Ht 6\' 4"  (1.93 m)   Wt 194 lb 3.2 oz (88.1 kg)   SpO2 98%   BMI 23.64 kg/m , BMI Body mass index is 23.64 kg/m.  Wt Readings from Last 3 Encounters:  05/27/18 194 lb 3.2 oz (88.1 kg)  05/23/16 210 lb (95.3 kg)  11/29/15 205 lb (93 kg)    General: Patient appears comfortable at rest. HEENT: Conjunctiva and lids normal, oropharynx clear. Neck: Supple, no elevated JVP or carotid bruits, no thyromegaly. Lungs: Clear to auscultation, nonlabored breathing at rest. Cardiac: Regular rate and rhythm, no S3 or significant systolic murmur, no pericardial rub. Abdomen: Soft, nontender, bowel  sounds present. Extremities: No pitting edema, distal pulses 2+. Skin: Warm and dry. Musculoskeletal: No kyphosis. Neuropsychiatric: Alert and oriented x3, affect grossly appropriate.  ECG: I personally reviewed the tracing from 05/16/2016 which showed sinus rhythm with old anterior infarct pattern.  Recent Labwork:  November 2017: Cholesterol 153, HDL 40, LDL 91, triglycerides 108  Other Studies Reviewed Today:  Echocardiogram 12/30/2015: Study Conclusions  - Left ventricle: The cavity size was normal. Wall thickness was   normal. Systolic function was mildly to moderately reduced. The   estimated ejection fraction was in the range of 40% to 45%. Left   ventricular diastolic function parameters were normal. - Regional wall motion abnormality: Akinesis of the mid anterior,   mid anteroseptal, apical septal, apical lateral, and apical   myocardium. - Aortic valve: Mildly calcified annulus. Trileaflet; mildly  thickened leaflets. Valve area (VTI): 3.22 cm^2. Valve area   (Vmax): 3.19 cm^2. Valve area (Vmean): 2.87 cm^2. - Mitral valve: Mildly calcified annulus. Mildly thickened leaflets. - Left atrium: The atrium was moderately dilated. - Atrial septum: No defect or patent foramen ovale was identified. - Technically adequate study.  Cardiac catheterization 05/29/2016 [facility]): 1. Severely calcified, eccentric obstructive disease of ostial circumflex  artery. The lesion could not be crossed by an IVUS catheter. 2. Severe obstructive lesion of proximal segment of distal LAD. 3. There is mild (30%) in-stent stenosis of mid LAD. 4. Minimal atherosclerotic disease of non-dominant RCA. 5. Normal LVEDP (15 mmHg)  Severely calcified, eccentric obstructive disease of ostial circumflex  artery. This lesion was successfully treated using rotation atherectomy  (with a 1.46mm Burr) and placement of 4 x 15mm Xience DES.  Assessment and Plan:  1.  CAD with history of PCI of the  LAD in 2004 with subsequent DES intervention to the LAD in 2011 and Cutting Balloon angioplasty due to in-stent restenosis in 2013.  He underwent rotational atherectomy with DES to a severely calcified ostial circumflex through the [facility] in 2017.  Now presents after observation at [facility] with chest pain, no definitive ACS by enzymes.  Medical regimen looks good at this time.  I reviewed his recent tracing.  Plan to proceed with a Lexiscan Myoview.  2.  History of ischemic cardiomyopathy, LVEF was 40 to 45% as of 2017.  3.  Essential hypertension, blood pressure is well controlled today.  4.  Mixed hyperlipidemia, on Pravachol.  Patient states that he follows with Dr. Willaim Bane.   Current medicines were reviewed with the patient today.   Orders Placed This Encounter  Procedures  . NM Myocar Multi W/Spect W/Wall Motion / EF    Disposition: Call with test results and further plans.  Signed, Jonelle Sidle, MD, [facility] 05/27/2018 1:40 PM    [facility] [address] [place], [place], [place] [address] Phone: [phone]; Fax: [phone]

## 2018-05-27 NOTE — Telephone Encounter (Signed)
°  Precert needed for: Lexiscan  Location: [facility]  Date: Jun 03, 2018 arrive at 7:45

## 2018-05-27 NOTE — Patient Instructions (Signed)
Your physician recommends that you schedule a follow-up appointment PENDING TEST RESULTS  Your physician recommends that you continue on your current medications as directed. Please refer to the Current Medication list given to you today.  Your physician has requested that you have a lexiscan myoview. For further information please visit [email or web address]. Please follow instruction sheet, as given.  Thank you for choosing [facility]!!    

## 2018-06-03 ENCOUNTER — Ambulatory Visit (HOSPITAL_COMMUNITY)
Admission: RE | Admit: 2018-06-03 | Discharge: 2018-06-03 | Disposition: A | Discharge status: Home / Self Care | Payer: Medicare Other | Source: Ambulatory Visit | Attending: Cardiology | Admitting: Cardiology

## 2018-06-03 ENCOUNTER — Encounter (HOSPITAL_COMMUNITY)
Admission: RE | Admit: 2018-06-03 | Discharge: 2018-06-03 | Disposition: A | Discharge status: Home / Self Care | Payer: Medicare Other | Source: Ambulatory Visit | Attending: Cardiology | Admitting: Cardiology

## 2018-06-03 ENCOUNTER — Encounter (HOSPITAL_COMMUNITY): Payer: Self-pay

## 2018-06-03 DIAGNOSIS — I25119 Atherosclerotic heart disease of native coronary artery with unspecified angina pectoris: Secondary | ICD-10-CM

## 2018-06-03 LAB — NM MYOCAR MULTI W/SPECT W/WALL MOTION / EF
CHL CUP NUCLEAR SSS: 26
LHR: 0.35
LVDIAVOL: 147 mL (ref 62–150)
LVSYSVOL: 90 mL
NUC STRESS TID: 1.07
Peak HR: 86 {beats}/min
Rest HR: 61 {beats}/min
SDS: 5
SRS: 21

## 2018-06-03 MED ORDER — REGADENOSON 0.4 MG/5ML IV SOLN
INTRAVENOUS | Status: AC
  Administered 2018-06-03: 0.4 mg via INTRAVENOUS
  Filled 2018-06-03: qty 5

## 2018-06-03 MED ORDER — SODIUM CHLORIDE 0.9% FLUSH
INTRAVENOUS | Status: AC
  Administered 2018-06-03: 10 mL via INTRAVENOUS
  Filled 2018-06-03: qty 10

## 2018-06-03 MED ORDER — SODIUM CHLORIDE 0.9% FLUSH
INTRAVENOUS | Status: AC
  Filled 2018-06-03: qty 150

## 2018-06-03 MED ORDER — TECHNETIUM TC 99M TETROFOSMIN IV KIT
30.0000 | PACK | Freq: Once | INTRAVENOUS | Status: AC | PRN
  Administered 2018-06-03: 30.1 via INTRAVENOUS

## 2018-06-03 MED ORDER — TECHNETIUM TC 99M TETROFOSMIN IV KIT
10.0000 | PACK | Freq: Once | INTRAVENOUS | Status: AC | PRN
  Administered 2018-06-03: 10.33 via INTRAVENOUS

## 2018-06-04 ENCOUNTER — Telehealth: Payer: Self-pay | Admitting: *Deleted

## 2018-06-04 MED ORDER — ISOSORBIDE MONONITRATE 20 MG PO TABS: 20.0000 mg | ORAL_TABLET | Freq: Two times a day (BID) | ORAL | 1 refills | Status: DC

## 2018-06-04 NOTE — Telephone Encounter (Signed)
-----   Message from Jonelle SidleSamuel G McDowell, MD sent at 06/04/2018  7:52 AM EST ----- Results reviewed. Myoview consistent with prior infarct scar affecting the inferior, septal, and anteroseptal distributions. No definite ischemia however. LVEF similar range to 2017. Can try to increase Ismo (short acting nitrate) to 20 mg BID and see if that helps with possible angina. If symptoms continue, cardiac catheterization can be discussed.  A [facility] of this test [facility], Nettie Elmhan M, MD.

## 2018-06-04 NOTE — Telephone Encounter (Signed)
Patient informed and verbalized understanding of plan. Copy sent to PCP 

## 2018-06-07 ENCOUNTER — Ambulatory Visit: Payer: BLUE CROSS/BLUE SHIELD | Admitting: Cardiology

## 2018-06-28 ENCOUNTER — Ambulatory Visit: Payer: BLUE CROSS/BLUE SHIELD | Admitting: Cardiology

## 2018-06-28 ENCOUNTER — Encounter

## 2018-11-22 ENCOUNTER — Other Ambulatory Visit: Payer: Self-pay

## 2018-11-22 MED ORDER — FUROSEMIDE 40 MG PO TABS: 40.0000 mg | ORAL_TABLET | Freq: Every morning | ORAL | 3 refills | Status: DC

## 2018-11-22 NOTE — Telephone Encounter (Signed)
Refilled furosemide

## 2018-12-01 ENCOUNTER — Other Ambulatory Visit: Payer: Self-pay | Admitting: Cardiology

## 2019-01-24 ENCOUNTER — Telehealth: Payer: Self-pay | Admitting: Nurse Practitioner

## 2019-01-24 ENCOUNTER — Other Ambulatory Visit: Payer: Self-pay | Admitting: Physical Medicine and Rehabilitation

## 2019-01-24 DIAGNOSIS — M5416 Radiculopathy, lumbar region: Secondary | ICD-10-CM

## 2019-01-24 NOTE — Telephone Encounter (Signed)
Phone call to patient to verify medication list and allergies for myelogram procedure. Pt instructed to hold Plavix for 5 days prior to myelogram appointment time, pending approval and further recommendations from cardiologist Dr. Rozann Lesches. Pt verbalized understanding. Pre and post procedure instructions reviewed with pt. Faxed thinner hold request to cardiologist, awaiting response.

## 2019-02-11 ENCOUNTER — Ambulatory Visit
Admission: RE | Admit: 2019-02-11 | Discharge: 2019-02-11 | Disposition: A | Discharge status: Home / Self Care | Payer: Medicare Other | Source: Ambulatory Visit | Attending: Physical Medicine and Rehabilitation | Admitting: Physical Medicine and Rehabilitation

## 2019-02-11 DIAGNOSIS — M5416 Radiculopathy, lumbar region: Secondary | ICD-10-CM

## 2019-02-11 MED ORDER — DIAZEPAM 5 MG PO TABS
5.0000 mg | ORAL_TABLET | Freq: Once | ORAL | Status: AC
  Administered 2019-02-11: 13:00:00 5 mg via ORAL

## 2019-02-11 MED ORDER — IOPAMIDOL (ISOVUE-M 200) INJECTION 41%
20.0000 mL | Freq: Once | INTRAMUSCULAR | Status: AC
  Administered 2019-02-11: 20 mL via INTRATHECAL

## 2019-02-11 NOTE — Discharge Instructions (Signed)

## 2019-02-11 NOTE — Progress Notes (Signed)
Pt reports he has been off his plavix for 5 days for his myelogram procedure today.

## 2019-03-11 ENCOUNTER — Telehealth: Payer: Self-pay | Admitting: *Deleted

## 2019-03-11 NOTE — Telephone Encounter (Signed)
The patient verbally consented for a telehealth phone visit with [facility] and understands that his/her insurance company will be billed for the encounter.  Will have vital & medications ready.

## 2019-03-27 ENCOUNTER — Telehealth: Payer: 59 | Admitting: Cardiology

## 2019-04-09 ENCOUNTER — Telehealth (INDEPENDENT_AMBULATORY_CARE_PROVIDER_SITE_OTHER): Payer: Medicare Other | Admitting: Cardiology

## 2019-04-09 ENCOUNTER — Encounter: Payer: Self-pay | Admitting: Cardiology

## 2019-04-09 ENCOUNTER — Encounter: Payer: Self-pay | Admitting: *Deleted

## 2019-04-09 ENCOUNTER — Other Ambulatory Visit: Payer: Self-pay

## 2019-04-09 VITALS — BP 132/78 | HR 68 | Ht 76.0 in | Wt 205.0 lb

## 2019-04-09 DIAGNOSIS — I25119 Atherosclerotic heart disease of native coronary artery with unspecified angina pectoris: Secondary | ICD-10-CM

## 2019-04-09 DIAGNOSIS — I255 Ischemic cardiomyopathy: Secondary | ICD-10-CM | POA: Diagnosis not present

## 2019-04-09 DIAGNOSIS — E782 Mixed hyperlipidemia: Secondary | ICD-10-CM | POA: Diagnosis not present

## 2019-04-09 DIAGNOSIS — I10 Essential (primary) hypertension: Secondary | ICD-10-CM

## 2019-04-09 NOTE — Progress Notes (Signed)
Virtual Visit via Telephone Note   This visit type was conducted due to national recommendations for restrictions regarding the COVID-19 Pandemic (e.g. social distancing) in an effort to limit this patient's exposure and mitigate transmission in our community.  Due to his co-morbid illnesses, this patient is at least at moderate risk for complications without adequate follow up.  This format is felt to be most appropriate for this patient at this time.  The patient did not have access to video technology/had technical difficulties with video requiring transitioning to audio format only (telephone).  All issues noted in this document were discussed and addressed.  No physical exam could be performed with this format.  Please refer to the patient's chart for his  consent to telehealth for [facility].   Date:  04/09/2019   ID:  Steven Haas, DOB 1951-05-29, MRN [id number]  Patient Location: Home Provider Location: Office  PCP:  [name], MD  Cardiologist:  Nona Dell, MD Electrophysiologist:  None   Evaluation Performed:  Follow-Up Visit  Chief Complaint:   Cardiac follow-up  History of Present Illness:    Steven Haas is a 68 y.o. male last seen in November 2019.  We spoke by phone today.  He reports 1 episode of potential angina since I last saw him.  He has been tolerating the addition of long-acting nitrates.  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  No other major health changes.  He underwent a cardiac catheterization in November 2017 through the [facility] which demonstrated a severely calcified stenosis involving the ostium circumflex that was managed with rotational atherectomy and DES. Previously placed mid LAD stent exhibited 30% in-stent restenosis.  I reviewed his medications which are outlined below.  He states that he had lab work with Dr. Willaim Bane, we are requesting this for review.  He is working on better glucose control.  He  continues on Pravachol for management of lipids.  We discussed updating an echocardiogram for evaluation of LVEF.  The patient does not have symptoms concerning for COVID-19 infection (fever, chills, cough, or new shortness of breath).    Past Medical History:  Diagnosis Date   CKD (chronic kidney disease), stage II    Coronary atherosclerosis of native coronary artery    Remote MI 2004 s/p LAD PCI in [place], s/p DES to mid LAD for ISR 01/2010, s/p cutting balloon PCI to prox LAD for ISR 11/16/11, DES ostial circumflex 05/2016 [facility]   Essential hypertension, benign    GERD (gastroesophageal reflux disease)    Ischemic cardiomyopathy    LVEF 40-45% with apical akinesis with scar as well as anterolateral HK 02/2011, LVEF 50% at cath 06/2013.   Sinus bradycardia    Metoprolol stopped 11/2011 due to symptomatic bradycardia.   Syncope    Status post bradycardia on high-dose blocker therapy   Type 2 diabetes mellitus (HCC)    Past Surgical History:  Procedure Laterality Date   ELBOW SURGERY     Left   LAPAROSCOPIC CHOLECYSTECTOMY  12/23/13   Dr. Laurel Dimmer   LEFT AND RIGHT HEART CATHETERIZATION WITH CORONARY ANGIOGRAM  06/09/2011   Procedure: LEFT AND RIGHT HEART CATHETERIZATION WITH CORONARY ANGIOGRAM;  Surgeon: Herby Abraham, MD;  Location: [facility] CATH LAB;  Service: Cardiovascular;;   LEFT HEART CATHETERIZATION WITH CORONARY ANGIOGRAM N/A 11/16/2011   Procedure: LEFT HEART CATHETERIZATION WITH CORONARY ANGIOGRAM;  Surgeon: Laurey Morale, MD;  Location: [facility] CATH LAB;  Service:  Cardiovascular;  Laterality: N/A;   LEFT HEART CATHETERIZATION WITH CORONARY ANGIOGRAM N/A 06/24/2013   Procedure: LEFT HEART CATHETERIZATION WITH CORONARY ANGIOGRAM;  Surgeon: Rollene Rotunda, MD;  Location: [facility] CATH LAB;  Service: Cardiovascular;  Laterality: N/A;   NOSE SURGERY     Tumors   SHOULDER SURGERY     Right     Current Meds  Medication Sig   acetaminophen (TYLENOL)  500 MG tablet Take 500 mg by mouth every 6 (six) hours as needed.   aspirin 81 MG tablet Take 81 mg by mouth daily.   clopidogrel (PLAVIX) 75 MG tablet Take 75 mg by mouth daily.     doxazosin (CARDURA) 1 MG tablet Take 1 mg by mouth daily.   empagliflozin (JARDIANCE) 10 MG TABS tablet Take 10 mg by mouth daily.   furosemide (LASIX) 40 MG tablet Take 1 tablet (40 mg total) by mouth every morning.   isosorbide mononitrate (ISMO) 20 MG tablet TAKE 1 TABLET (20 MG TOTAL) BY MOUTH 2 (TWO) TIMES DAILY AT 10 AM AND 5 PM.   NITROSTAT 0.4 MG SL tablet PLAEC 1 TAB UNDER TONGUE EVERY 5 MINS AS NEEDED FOR CHEST PAIN**MAX 3TABS/IN 15 MIN**   pantoprazole (PROTONIX) 40 MG tablet TAKE 1 TABLET DAILY AT 12 NOON   pravastatin (PRAVACHOL) 80 MG tablet Take 80 mg by mouth daily.    ramipril (ALTACE) 10 MG capsule Take 20 mg by mouth every morning.   [DISCONTINUED] pravastatin (PRAVACHOL) 40 MG tablet Take 40 mg by mouth at bedtime.      Allergies:   Patient has no known allergies.   Social History   Tobacco Use   Smoking status: Never Smoker   Smokeless tobacco: Never Used  Substance Use Topics   Alcohol use: No    Alcohol/week: 0.0 standard drinks   Drug use: No     Family Hx: The patient's family history includes Heart attack in his father; Hypertension in his sister, sister, sister, and sister; Stroke in his father.  ROS:   Please see the history of present illness. All other systems reviewed and are negative.   Prior CV studies:   The following studies were reviewed today:  Echocardiogram 12/30/2015: Study Conclusions  - Left ventricle: The cavity size was normal. Wall thickness was normal. Systolic function was mildly to moderately reduced. The estimated ejection fraction was in the range of 40% to 45%. Left ventricular diastolic function parameters were normal. - Regional wall motion abnormality: Akinesis of the mid anterior, mid anteroseptal, apical septal,  apical lateral, and apical myocardium. - Aortic valve: Mildly calcified annulus. Trileaflet; mildly thickened leaflets. Valve area (VTI): 3.22 cm^2. Valve area (Vmax): 3.19 cm^2. Valve area (Vmean): 2.87 cm^2. - Mitral valve: Mildly calcified annulus. Mildly thickened leaflets. - Left atrium: The atrium was moderately dilated. - Atrial septum: No defect or patent foramen ovale was identified. - Technically adequate study.  Cardiac catheterization 05/29/2016 [facility]): 1. Severely calcified, eccentric obstructive disease of ostial circumflex  artery. The lesion could not be crossed by an IVUS catheter. 2. Severe obstructive lesion of proximal segment of distal LAD. 3. There is mild (30%) in-stent stenosis of mid LAD. 4. Minimal atherosclerotic disease of non-dominant RCA. 5. Normal LVEDP (15 mmHg)  Severely calcified, eccentric obstructive disease of ostial circumflex  artery. This lesion was successfully treated using rotation atherectomy  (with a 1.62mm Burr) and placement of 4 x 68mm Xience DES.  Lexiscan Myoview 06/03/2018:  There was no ST segment deviation noted during  stress.  Findings consistent with extensive prior myocardial infarction including the inferior, inferoseptal, septal, anteroseptal, and apical walls without current ischemia.  This is an intermediate risk study. Risk based on decreased LVEF, there is no significant myocardium currently at jeopardy.  The left ventricular ejection fraction is moderately decreased (30-44%).  Labs/Other Tests and Data Reviewed:    EKG:  An ECG dated 05/21/2018 was personally reviewed today and demonstrated:  Sinus rhythm with PVCs.  Recent Labs:  November 2017: Cholesterol 153, HDL 40, LDL 91, triglycerides 108  Wt Readings from Last 3 Encounters:  04/09/19 205 lb (93 kg)  05/27/18 194 lb 3.2 oz (88.1 kg)  05/23/16 210 lb (95.3 kg)     Objective:    Vital Signs:  BP 132/78    Pulse 68    Ht 6\' 4"  (1.93 m)     Wt 205 lb (93 kg)    SpO2 97%    BMI 24.95 kg/m    Patient spoke in full sentences, not short of breath. No audible wheezing or coughing. Speech pattern normal.  ASSESSMENT & PLAN:    1.  CAD status post PCI of the LAD in 2004 with subsequent DES to the LAD in 2011 with cutting balloon angioplasty due to in-stent restenosis in 2013.  He underwent rotational atherectomy with DES to the ostial circumflex in 2017 and has been managed medically.  Follow-up Myoview from November of last year showed large region of scar but no active ischemic territories.  Plan to continue current medications.  2.  Ischemic cardiomyopathy, follow-up echocardiogram will be obtained.  LVEF 40 to 45% by last assessment.  3.  Essential hypertension, blood pressure is adequately controlled today.  4.  Mixed hyperlipidemia on Pravachol.  Requesting interval lab work from Dr. Hampton Abbot.  COVID-19 Education: The signs and symptoms of COVID-19 were discussed with the patient and how to seek care for testing (follow up with PCP or arrange E-visit).  The importance of social distancing was discussed today.  Time:   Today, I have spent 6 minutes with the patient with telehealth technology discussing the above problems.     Medication Adjustments/Labs and Tests Ordered: Current medicines are reviewed at length with the patient today.  Concerns regarding medicines are outlined above.   Tests Ordered: Orders Placed This Encounter  Procedures   ECHOCARDIOGRAM COMPLETE    Medication Changes: No orders of the defined types were placed in this encounter.   Follow Up:  In Person 6 months in the [place] office.  Signed, Rozann Lesches, MD  04/09/2019 1:55 PM    [place]

## 2019-04-09 NOTE — Patient Instructions (Signed)

## 2019-05-07 ENCOUNTER — Other Ambulatory Visit: Payer: Self-pay

## 2019-05-07 ENCOUNTER — Ambulatory Visit (INDEPENDENT_AMBULATORY_CARE_PROVIDER_SITE_OTHER): Payer: Medicare Other

## 2019-05-07 DIAGNOSIS — I255 Ischemic cardiomyopathy: Secondary | ICD-10-CM

## 2019-05-08 ENCOUNTER — Telehealth: Payer: Self-pay | Admitting: *Deleted

## 2019-05-08 NOTE — Telephone Encounter (Signed)
Patient informed. Copy sent to PCP °

## 2019-05-08 NOTE — Telephone Encounter (Signed)
-----   Message from Satira Sark, MD sent at 05/07/2019 12:32 PM EDT ----- Results reviewed.  Follow-up echocardiogram shows LVEF 45 to 50% which represents an improvement compared to prior testing.  Continue with current medications and follow-up plan.

## 2019-05-31 ENCOUNTER — Other Ambulatory Visit: Payer: Self-pay | Admitting: Cardiology

## 2019-07-22 ENCOUNTER — Telehealth: Payer: Self-pay | Admitting: Cardiology

## 2019-07-22 NOTE — Telephone Encounter (Signed)
Advised that it is safe to take the covid vaccine

## 2019-07-22 NOTE — Telephone Encounter (Signed)
Patient asking if it is safe for him to have the COVID Vaccine

## 2019-09-28 ENCOUNTER — Other Ambulatory Visit: Payer: Self-pay | Admitting: Cardiology

## 2019-10-20 ENCOUNTER — Encounter: Payer: Self-pay | Admitting: *Deleted

## 2019-10-21 ENCOUNTER — Encounter: Payer: Self-pay | Admitting: *Deleted

## 2019-10-21 ENCOUNTER — Ambulatory Visit (INDEPENDENT_AMBULATORY_CARE_PROVIDER_SITE_OTHER): Payer: Medicare Other | Admitting: Family Medicine

## 2019-10-21 ENCOUNTER — Other Ambulatory Visit: Payer: Self-pay

## 2019-10-21 ENCOUNTER — Encounter: Payer: Self-pay | Admitting: Cardiology

## 2019-10-21 VITALS — BP 130/62 | HR 60 | Ht 76.0 in | Wt 208.0 lb

## 2019-10-21 DIAGNOSIS — I255 Ischemic cardiomyopathy: Secondary | ICD-10-CM

## 2019-10-21 DIAGNOSIS — I10 Essential (primary) hypertension: Secondary | ICD-10-CM

## 2019-10-21 DIAGNOSIS — E782 Mixed hyperlipidemia: Secondary | ICD-10-CM

## 2019-10-21 DIAGNOSIS — I2510 Atherosclerotic heart disease of native coronary artery without angina pectoris: Secondary | ICD-10-CM | POA: Diagnosis not present

## 2019-10-21 NOTE — Patient Instructions (Signed)

## 2019-10-21 NOTE — Progress Notes (Signed)
Cardiology Office Note  Date: 10/21/2019   ID: Steven Haas, DOB Nov 05, 1950, MRN [id number]  PCP:  Ranae Palms, MD  Cardiologist:  Rozann Lesches, MD Electrophysiologist:  None   Chief Complaint: CAD, ICM, HTN, HLD  History of Present Illness: Steven Haas is a 69 y.o. male with a history of CAD status post PCI of LAD 2004.  Subsequent DES to LAD in 2011 with Cutting Balloon angioplasty due to in-stent restenosis 2013.  Rotational atherectomy with DES to ostial circumflex 2017.  Managed medically.  Follow-up Myoview November 2019 showed large regional scar but no active ischemic territories.  Last saw Dr. Domenic Polite April 09, 2019 via telemedicine visit.  He reported one episode of potential angina since he was last seen.  He had been tolerating long-acting nitrates.  He had underwent a cardiac catheterization November 2017 through [facility] clinic demonstrating a severe calcified stenosis involving the ostial circumflex management with rotational atherectomy and DES.  Previous placed mid LAD stent exhibited 30% IntraStent restenosis.  A discussion took place regarding updating his echocardiogram for evaluation of left ventricular ejection fraction.  Interval lab work from Dr. Hampton Abbot was requested.  Blood pressure was adequately controlled.  Patient denies any recent acute illnesses, hospitalizations, travels, Covid virus symptoms.  Denies any progressive anginal or exertional symptoms recently no use of sublingual nitroglycerin.  He denies any palpitations or arrhythmias, orthostatic symptoms, stroke or TIA-like symptoms, dyspeptic symptoms, or blood in stool or urine.  Denies any claudication-like symptoms, DVT or PE-like symptoms, or lower extremity edema.  He has been receiving lumbar steroid injections for relief of sciatic.  States this has been shooting his blood sugars up states he has some right ankle pain and right shoulder pain secondary to likely arthritis.   His last hemoglobin  A1c at Dr. Romilda Garret office was 8.6.  He has a follow-up soon with Dr. Romilda Garret and states he will have him forward any lab work to [place].  He is very active on a daily basis and denies any significant issues.  He works at a funeral home   Past Medical History:  Diagnosis Date  . CKD (chronic kidney disease), stage II   . Coronary atherosclerosis of native coronary artery    Remote MI 2004 s/p LAD PCI in [place], s/p DES to mid LAD for ISR 01/2010, s/p cutting balloon PCI to prox LAD for ISR 11/16/11, DES ostial circumflex 05/2016 [facility]  . Essential hypertension, benign   . GERD (gastroesophageal reflux disease)   . Ischemic cardiomyopathy    LVEF 40-45% with apical akinesis with scar as well as anterolateral HK 02/2011, LVEF 50% at cath 06/2013.  . Sinus bradycardia    Metoprolol stopped 11/2011 due to symptomatic bradycardia.  . Syncope    Status post bradycardia on high-dose blocker therapy  . Type 2 diabetes mellitus ([place])     Past Surgical History:  Procedure Laterality Date  . ELBOW SURGERY     Left  . LAPAROSCOPIC CHOLECYSTECTOMY  12/23/13   Dr. Hilma Favors  . LEFT AND RIGHT HEART CATHETERIZATION WITH CORONARY ANGIOGRAM  06/09/2011   Procedure: LEFT AND RIGHT HEART CATHETERIZATION WITH CORONARY ANGIOGRAM;  Surgeon: Hillary Bow, MD;  Location: [facility] CATH LAB;  Service: Cardiovascular;;  . LEFT HEART CATHETERIZATION WITH CORONARY ANGIOGRAM N/A 11/16/2011   Procedure: LEFT HEART CATHETERIZATION WITH CORONARY ANGIOGRAM;  Surgeon: Larey Dresser, MD;  Location: [facility] CATH LAB;  Service: Cardiovascular;  Laterality: N/A;  . LEFT HEART CATHETERIZATION  WITH CORONARY ANGIOGRAM N/A 06/24/2013   Procedure: LEFT HEART CATHETERIZATION WITH CORONARY ANGIOGRAM;  Surgeon: Rollene Rotunda, MD;  Location: [facility] CATH LAB;  Service: Cardiovascular;  Laterality: N/A;  . NOSE SURGERY     Tumors  . SHOULDER SURGERY     Right    Current Outpatient Medications  Medication Sig Dispense Refill  .  acetaminophen (TYLENOL) 500 MG tablet Take 500 mg by mouth every 6 (six) hours as needed.    Marland Kitchen aspirin 81 MG tablet Take 81 mg by mouth daily.    . clopidogrel (PLAVIX) 75 MG tablet Take 75 mg by mouth daily.      Marland Kitchen doxazosin (CARDURA) 1 MG tablet Take 1 mg by mouth daily.    . furosemide (LASIX) 40 MG tablet TAKE 1 TABLET BY MOUTH EVERY DAY IN THE MORNING 90 tablet 0  . isosorbide mononitrate (ISMO) 20 MG tablet TAKE 1 TABLET (20 MG TOTAL) BY MOUTH 2 (TWO) TIMES DAILY AT 10 AM AND 5 PM. 180 tablet 2  . NITROSTAT 0.4 MG SL tablet PLAEC 1 TAB UNDER TONGUE EVERY 5 MINS AS NEEDED FOR CHEST PAIN**MAX 3TABS/IN 15 MIN** 25 tablet 2  . pantoprazole (PROTONIX) 40 MG tablet TAKE 1 TABLET DAILY AT 12 NOON 90 tablet 3  . pioglitazone (ACTOS) 30 MG tablet Take 30 mg by mouth daily.     . pravastatin (PRAVACHOL) 80 MG tablet Take 80 mg by mouth daily.     . ramipril (ALTACE) 10 MG capsule Take 10 mg by mouth 2 (two) times daily.      No current facility-administered medications for this visit.   Allergies:  Patient has no known allergies.   Social History: The patient  reports that he has never smoked. He has never used smokeless tobacco. He reports that he does not drink alcohol or use drugs.   Family History: The patient's family history includes Heart attack in his father; Hypertension in his sister, sister, sister, and sister; Stroke in his father.   ROS:  Please see the history of present illness. Otherwise, complete review of systems is positive for none.  All other systems are reviewed and negative.   Physical Exam: VS:  BP 130/62   Pulse 60   Ht 6\' 4"  (1.93 m)   Wt 208 lb (94.3 kg)   SpO2 97%   BMI 25.32 kg/m , BMI Body mass index is 25.32 kg/m.  Wt Readings from Last 3 Encounters:  10/21/19 208 lb (94.3 kg)  04/09/19 205 lb (93 kg)  05/27/18 194 lb 3.2 oz (88.1 kg)    General: Patient appears comfortable at rest. Lungs: Clear to auscultation, nonlabored breathing at rest. Cardiac:  Regular rate and rhythm, no S3 or significant systolic murmur, no pericardial rub. Extremities: No pitting edema, distal pulses 2+. Skin: Warm and dry. Musculoskeletal: No kyphosis. Neuropsychiatric: Alert and oriented x3, affect grossly appropriate.  ECG:  An ECG dated 10/21/2019 was personally reviewed today and demonstrated:  Sinus bradycardia rate of 59 with occasional PAC, incomplete left bundle branch block, old anterior infarct, diffuse nonspecific T abnormality  Recent Labwork:  Labs from Dr. 12/21/2019 office on 03/04/2019 showed hemoglobin A1c of 8.6, LDL 63, GFR 56,    No results found for requested labs within last 8760 hours.     Component Value Date/Time   CHOL 139 06/22/2013 0045   TRIG 142 06/22/2013 0045   HDL 36 (L) 06/22/2013 0045   CHOLHDL 3.9 06/22/2013 0045   VLDL 28 06/22/2013 0045  LDLCALC 75 06/22/2013 0045    Other Studies Reviewed Today:  Echocardiogram 05/07/2019 1. Left ventricular ejection fraction, by visual estimation, is 45 to 50%. The left ventricle has mildly decreased function. Normal left ventricular size. There is borderline left ventricular hypertrophy. 2. Multiple segmental abnormalities exist. See findings. Consistent with ischemic cardiomyopathy. 3. Global right ventricle has normal systolic function.The right ventricular size is normal. No increase in right ventricular wall thickness. 4. Left atrial size was moderately dilated. 5. Right atrial size was normal. 6. Mild aortic valve annular calcification. 7. Mild mitral annular calcification. 8. The mitral valve is grossly normal. Trace mitral valve regurgitation. 9. The tricuspid valve is grossly normal. Tricuspid valve regurgitation is trivial. 10. The aortic valve is tricuspid Aortic valve regurgitation was not visualized by color flow Doppler. 11. The pulmonic valve was grossly normal. Pulmonic valve regurgitation is trivial by color flow Doppler. 12. Normal pulmonary artery systolic  pressure. 13. The tricuspid regurgitant velocity is 1.47 m/s, and with an assumed right atrial pressure of 10 mmHg, the estimated right ventricular systolic pressure is normal at 18.6 mmHg. In comparison to the previous echocardiogram(s): Echocardiogram done 12/30/15 reported an EF of 45%. Unable to compare directly with images  Echocardiogram 12/30/2015: Study Conclusions  - Left ventricle: The cavity size was normal. Wall thickness was normal. Systolic function was mildly to moderately reduced. The estimated ejection fraction was in the range of 40% to 45%. Left ventricular diastolic function parameters were normal. - Regional wall motion abnormality: Akinesis of the mid anterior, mid anteroseptal, apical septal, apical lateral, and apical myocardium. - Aortic valve: Mildly calcified annulus. Trileaflet; mildly thickened leaflets. Valve area (VTI): 3.22 cm^2. Valve area (Vmax): 3.19 cm^2. Valve area (Vmean): 2.87 cm^2. - Mitral valve: Mildly calcified annulus. Mildly thickened leaflets. - Left atrium: The atrium was moderately dilated. - Atrial septum: No defect or patent foramen ovale was identified. - Technically adequate study.  Cardiac catheterization 05/29/2016([facility]): 1. Severely calcified, eccentric obstructive disease of ostial circumflex  artery. The lesion could not be crossed by an IVUS catheter. 2. Severe obstructive lesion of proximal segment of distal LAD. 3. There is mild (30%) in-stent stenosis of mid LAD. 4. Minimal atherosclerotic disease of non-dominant RCA. 5. Normal LVEDP (15 mmHg)  Severely calcified, eccentric obstructive disease of ostial circumflex  artery. This lesion was successfully treated using rotation atherectomy  (with a 1.55mm Burr) and placement of 4 x 74mm Xience DES.  Lexiscan Myoview 06/03/2018:  There was no ST segment deviation noted during stress.  Findings consistent with extensive prior myocardial  infarction including the inferior, inferoseptal, septal, anteroseptal, and apical walls without current ischemia.  This is an intermediate risk study. Risk based on decreased LVEF, there is no significant myocardium currently at jeopardy.  The left ventricular ejection fraction is moderately decreased (30-44%).  Assessment and Plan:  1. CAD in native artery   2. Ischemic cardiomyopathy   3. Essential hypertension   4. Mixed hyperlipidemia    1. CAD in native artery Denies any progressive anginal or exertional symptoms.  Denies any recent use of sublingual nitroglycerin.  Continue aspirin 81 mg.  Plavix 75 mg, Imdur 20 mg p.o. twice daily, sublingual nitroglycerin as needed  2. Ischemic cardiomyopathy Recent echo May 07, 2019 showed increased EF 45 to 50% compared to previous echo in 2017 which was 40 to 45%.  Borderline LVH.  Multiple segmental abnormalities exist consistent with ischemic cardiomyopathy.  Moderate LAE, trace MR, trivial TR, trivial PR.  3.  Essential hypertension Blood pressure is well controlled on current therapy.  BP today 130/62.  Continue ramipril 10 mg p.o. twice daily, Lasix 40 mg daily  4. Mixed hyperlipidemia Most recent LDL from PCP office was 63.  Continue Pravachol 80 mg daily.  Medication Adjustments/Labs and Tests Ordered: Current medicines are reviewed at length with the patient today.  Concerns regarding medicines are outlined above.   Disposition: Follow-up with Dr. Diona Browner or APP Signed, Rennis Harding, NP 10/21/2019 9:49 AM    [facility] at [facility] [address] [place], [place], [place] [address] Phone: [phone]; Fax: [phone]

## 2020-01-25 ENCOUNTER — Other Ambulatory Visit: Payer: Self-pay | Admitting: Cardiology

## 2020-05-15 ENCOUNTER — Other Ambulatory Visit: Payer: Self-pay | Admitting: Cardiology

## 2020-05-23 ENCOUNTER — Other Ambulatory Visit: Payer: Self-pay | Admitting: Cardiology

## 2020-10-26 ENCOUNTER — Telehealth: Payer: Self-pay | Admitting: *Deleted

## 2020-10-26 NOTE — Telephone Encounter (Signed)
   [place] Pre-operative Risk Assessment    Patient Name: Steven Haas  DOB: [birth year]  MRN: [id number]   HEARTCARE STAFF: - Please ensure there is not already an duplicate clearance open for this procedure. - Under Visit Info/Reason for Call, type in Other and utilize the format Clearance MM/DD/YY or Clearance TBD. Do not use dashes or single digits. - If request is for dental extraction, please clarify the # of teeth to be extracted.  Request for surgical clearance:  1. What type of surgery is being performed? Spinal Injection  2. When is this surgery scheduled? TBD  3. What type of clearance is required (medical clearance vs. Pharmacy clearance to hold med vs. Both)? Pharmacy  4. Are there any medications that need to be held prior to surgery and how long? plavix & aspirin to be held 7 days prior  5. Practice name and name of physician performing surgery? [facility]/Dr. Ulice Bold  6. What is the office phone number? [phone]   7.   What is the office fax number? [phone]  8.   Anesthesia type (None, local, MAC, general) ?    Marlou Sa 10/26/2020, 8:49 AM  _________________________________________________________________   (provider comments below)

## 2020-10-27 NOTE — Telephone Encounter (Signed)
Dr. Diona Browner, This patient has a history of CAD dating back to 2004 with PCI of LAD, repeat DES to LAD 2011. He has a hx of ISR managed with cutting balloon angioplasty in 2013. Last heart cath in 2017 with ostial Cx treated with atherectomy and DES, ISR of 30% in mid LAD managed medically. He remains on ASA and plavix. We are asked to hold ASA and plavix for 7 days for upcoming spinal injection. Can you please give recommendations?  Thank you Angie

## 2020-10-27 NOTE — Telephone Encounter (Signed)
   Name: Steven Haas  DOB: 08-Jul-1951  MRN: [id number]   Primary Cardiologist: Nona Dell, MD  Chart reviewed as part of pre-operative protocol coverage. We have been asked for recommendations to hold ASA and plavix for spinal injection.  Per Dr. Diona Browner ASA and Plavix may be held as requested for spinal injection. Would anticipate resuming both subsequently when patient recovered and stable.   I will route this recommendation to the requesting party via Epic fax function and remove from pre-op pool. Please call with questions.  [name], PA 10/27/2020, 4:16 PM

## 2020-10-27 NOTE — Telephone Encounter (Signed)
Yes. ASA and Plavix may be held as requested for spinal injection. Would anticipate resuming both subsequently when patient recovered and stable.

## 2020-11-17 ENCOUNTER — Ambulatory Visit: Payer: 59 | Admitting: Cardiology

## 2021-02-07 ENCOUNTER — Encounter: Payer: Self-pay | Admitting: Cardiology

## 2021-02-07 NOTE — Progress Notes (Signed)
Cardiology Office Note  Date: 02/08/2021   ID: Steven Haas, DOB 18-Jan-1951, MRN [id number]  PCP:  [name], MD  Cardiologist:  Nona Dell, MD Electrophysiologist:  None   Chief Complaint  Patient presents with   Cardiac follow-up     History of Present Illness: Steven Haas is a 70 y.o. male last seen in April 2021 by Mr. Vincenza Hews NP.  He is here for a routine visit.  He states that recently he has noticed some angina symptoms and shortness of breath, particular exacerbated by working in the heat and humidity.  He has had relief with nitroglycerin.  Otherwise he reports compliance with his baseline medical therapy which is outlined below.  Last echocardiogram in October 2020 revealed LVEF 45 to 50% range with wall motion abnormalities consistent with ischemic cardiomyopathy, moderate left atrial enlargement, and trivial mitral and tricuspid regurgitation.  He states that he has had lab work in the interim with his PCP.  I personally reviewed his ECG today which shows sinus bradycardia with old anteroseptal infarct pattern.  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   Sinus bradycardia    Metoprolol stopped 11/2011 due to symptomatic bradycardia.   Syncope    Status post bradycardia on high-dose blocker therapy   Type 2 diabetes mellitus ([facility])     Past Surgical History:  Procedure Laterality Date   ELBOW SURGERY     Left   LAPAROSCOPIC CHOLECYSTECTOMY  12/23/13   Dr. Laurel Dimmer   LEFT AND  RIGHT HEART CATHETERIZATION WITH CORONARY ANGIOGRAM  06/09/2011   Procedure: LEFT AND RIGHT HEART CATHETERIZATION WITH CORONARY ANGIOGRAM;  Surgeon: Herby Abraham, MD;  Location: [facility] CATH LAB;  Service: Cardiovascular;;   LEFT HEART CATHETERIZATION WITH CORONARY ANGIOGRAM N/A 11/16/2011   Procedure: LEFT HEART CATHETERIZATION WITH CORONARY ANGIOGRAM;  Surgeon: Laurey Morale, MD;  Location: [facility] CATH LAB;  Service: Cardiovascular;  Laterality: N/A;   LEFT HEART CATHETERIZATION WITH CORONARY ANGIOGRAM N/A 06/24/2013   Procedure: LEFT HEART CATHETERIZATION WITH CORONARY ANGIOGRAM;  Surgeon: Rollene Rotunda, MD;  Location: [facility] CATH LAB;  Service: Cardiovascular;  Laterality: N/A;   NOSE SURGERY     Tumors   SHOULDER SURGERY     Right    Current Outpatient Medications  Medication Sig Dispense Refill   acetaminophen (TYLENOL) 500 MG tablet Take 500 mg by mouth every 6 (six) hours as needed.     aspirin 81 MG tablet Take 81 mg by mouth daily.     clopidogrel (PLAVIX) 75 MG tablet Take 75 mg by mouth daily.       doxazosin (CARDURA) 1 MG tablet Take 1 mg by mouth daily.     furosemide (LASIX) 40 MG tablet TAKE 1 TABLET BY MOUTH EVERY DAY IN THE MORNING 90 tablet 1   isosorbide mononitrate (ISMO) 20 MG tablet TAKE 1 TABLET (20 MG TOTAL) BY MOUTH 2 (TWO) TIMES DAILY AT 10 AM AND 5 PM. 180 tablet 1  NITROSTAT 0.4 MG SL tablet PLAEC 1 TAB UNDER TONGUE EVERY 5 MINS AS NEEDED FOR CHEST PAIN**MAX 3TABS/IN 15 MIN** 25 tablet 2   pantoprazole (PROTONIX) 40 MG tablet TAKE 1 TABLET DAILY AT 12 NOON 90 tablet 3   pioglitazone (ACTOS) 30 MG tablet Take 30 mg by mouth daily.      pravastatin (PRAVACHOL) 80 MG tablet Take 80 mg by mouth daily.      ramipril (ALTACE) 10 MG capsule Take 10 mg by mouth 2 (two) times daily.      No current facility-administered medications for this visit.   Allergies:  Patient has no known allergies.   ROS: No palpitations or syncope.  Physical Exam: VS:  BP 122/60   Pulse (!)  59   Ht 6\' 4"  (1.93 m)   Wt 207 lb 3.2 oz (94 kg)   SpO2 98%   BMI 25.22 kg/m , BMI Body mass index is 25.22 kg/m.  Wt Readings from Last 3 Encounters:  02/08/21 207 lb 3.2 oz (94 kg)  10/21/19 208 lb (94.3 kg)  04/09/19 205 lb (93 kg)    General: Patient appears comfortable at rest. HEENT: Conjunctiva and lids normal, wearing a mask. Neck: Supple, no elevated JVP or carotid bruits, no thyromegaly. Lungs: Clear to auscultation, nonlabored breathing at rest. Cardiac: Regular rate and rhythm, no S3, 1-2/6 systolic murmur, no pericardial rub. Extremities: No pitting edema.  ECG:  An ECG dated 10/21/2019 was personally reviewed today and demonstrated:  Sinus bradycardia with IVCD, possible old anterior infarct pattern, PVC.  Recent Labwork:  July 2021: BUN 22, creatinine 1.34, potassium 4.7, hemoglobin A1c 8.3%  Other Studies Reviewed Today:  Lexiscan Myoview 06/03/2018: There was no ST segment deviation noted during stress. Findings consistent with extensive prior myocardial infarction including the inferior, inferoseptal, septal, anteroseptal, and apical walls without current ischemia. This is an intermediate risk study. Risk based on decreased LVEF, there is no significant myocardium currently at jeopardy. The left ventricular ejection fraction is moderately decreased (30-44%).  Echocardiogram 05/07/2019:  1. Left ventricular ejection fraction, by visual estimation, is 45 to  50%. The left ventricle has mildly decreased function. Normal left  ventricular size. There is borderline left ventricular hypertrophy.   2. Multiple segmental abnormalities exist. See findings. Consistent with  ischemic cardiomyopathy.   3. Global right ventricle has normal systolic function.The right  ventricular size is normal. No increase in right ventricular wall  thickness.   4. Left atrial size was moderately dilated.   5. Right atrial size was normal.   6. Mild aortic valve annular  calcification.   7. Mild mitral annular calcification.   8. The mitral valve is grossly normal. Trace mitral valve regurgitation.   9. The tricuspid valve is grossly normal. Tricuspid valve regurgitation  is trivial.  10. The aortic valve is tricuspid Aortic valve regurgitation was not  visualized by color flow Doppler.  11. The pulmonic valve was grossly normal. Pulmonic valve regurgitation is  trivial by color flow Doppler.  12. Normal pulmonary artery systolic pressure.  13. The tricuspid regurgitant velocity is 1.47 m/s, and with an assumed  right atrial pressure of 10 mmHg, the estimated right ventricular systolic  pressure is normal at 18.6 mmHg.   Assessment and Plan:  1.  CAD with history of DES to the mid LAD with subsequent cutting Balloon angioplasty for ISR in 2013 as well as DES to the ostial circumflex in 2017.  He is reporting recent angina symptoms.  ECG  reviewed and stable.  Plan to follow-up with a Lexiscan Myoview on current medical regimen to assess ischemic burden.  For now continue aspirin, Plavix, Ismo, Altace, and Pravachol.  2.  Ischemic cardiomyopathy, LVEF 45 to 50% by last assessment in 2020.  Echocardiogram will be repeated.  I did talk with him about other medical therapy options as well.  3.  CKD stage II by history, creatinine 1.34 in July of last year.  Requesting interval lab work.  4.  Type 2 diabetes mellitus, on Actos with follow-up by PCP.  Requesting interval lab work.  Likely good candidate for SGLT2 inhibitor.  Medication Adjustments/Labs and Tests Ordered: Current medicines are reviewed at length with the patient today.  Concerns regarding medicines are outlined above.   Tests Ordered: Orders Placed This Encounter  Procedures   EKG 12-Lead     Medication Changes: No orders of the defined types were placed in this encounter.   Disposition:  Follow up  test results.  Signed, Jonelle Sidle, MD, [facility] 02/08/2021 8:33 AM    [facility] at [facility] [address] [place], [place], [place] [address] Phone: [phone]; Fax: [phone]

## 2021-02-08 ENCOUNTER — Ambulatory Visit (INDEPENDENT_AMBULATORY_CARE_PROVIDER_SITE_OTHER): Payer: Medicare Other | Admitting: Cardiology

## 2021-02-08 ENCOUNTER — Telehealth: Payer: Self-pay | Admitting: Cardiology

## 2021-02-08 ENCOUNTER — Other Ambulatory Visit: Payer: Self-pay

## 2021-02-08 ENCOUNTER — Encounter: Payer: Self-pay | Admitting: *Deleted

## 2021-02-08 ENCOUNTER — Encounter: Payer: Self-pay | Admitting: Cardiology

## 2021-02-08 VITALS — BP 122/60 | HR 59 | Ht 76.0 in | Wt 207.2 lb

## 2021-02-08 DIAGNOSIS — I255 Ischemic cardiomyopathy: Secondary | ICD-10-CM | POA: Diagnosis not present

## 2021-02-08 DIAGNOSIS — I25119 Atherosclerotic heart disease of native coronary artery with unspecified angina pectoris: Secondary | ICD-10-CM | POA: Diagnosis not present

## 2021-02-08 NOTE — Telephone Encounter (Signed)
Checking percert on the following patient for testing scheduled at [facility].    LEXISCAN ECHO   03/03/2021

## 2021-02-08 NOTE — Patient Instructions (Addendum)
Medication Instructions:  Your physician recommends that you continue on your current medications as directed. Please refer to the Current Medication list given to you today.  Labwork: none  Testing/Procedures: Your physician has requested that you have an echocardiogram. Echocardiography is a painless test that uses sound waves to create images of your heart. It provides your doctor with information about the size and shape of your heart and how well your heart's chambers and valves are working. This procedure takes approximately one hour. There are no restrictions for this procedure. Your physician has requested that you have a lexiscan myoview. For further information please visit [email or web address]. Please follow instruction sheet, as given.  Follow-Up: Your physician recommends that you schedule a follow-up appointment in: pending  Any Other Special Instructions Will Be Listed Below (If Applicable).  If you need a refill on your cardiac medications before your next appointment, please call your pharmacy. 

## 2021-03-03 ENCOUNTER — Other Ambulatory Visit: Payer: Self-pay

## 2021-03-03 ENCOUNTER — Ambulatory Visit (HOSPITAL_COMMUNITY)
Admission: RE | Admit: 2021-03-03 | Discharge: 2021-03-03 | Disposition: A | Discharge status: Home / Self Care | Payer: Medicare Other | Source: Ambulatory Visit | Attending: Cardiology | Admitting: Cardiology

## 2021-03-03 ENCOUNTER — Encounter (HOSPITAL_BASED_OUTPATIENT_CLINIC_OR_DEPARTMENT_OTHER)
Admission: RE | Admit: 2021-03-03 | Discharge: 2021-03-03 | Disposition: A | Discharge status: Home / Self Care | Payer: Medicare Other | Source: Ambulatory Visit | Attending: Cardiology | Admitting: Cardiology

## 2021-03-03 DIAGNOSIS — I25119 Atherosclerotic heart disease of native coronary artery with unspecified angina pectoris: Secondary | ICD-10-CM | POA: Insufficient documentation

## 2021-03-03 DIAGNOSIS — I255 Ischemic cardiomyopathy: Secondary | ICD-10-CM | POA: Insufficient documentation

## 2021-03-03 LAB — NM MYOCAR MULTI W/SPECT W/WALL MOTION / EF
LV dias vol: 174 mL (ref 62–150)
LV sys vol: 109 mL
Peak HR: 78 {beats}/min
RATE: 0.44
Rest HR: 52 {beats}/min
SDS: 2
SRS: 21
SSS: 22
TID: 1.03

## 2021-03-03 LAB — ECHOCARDIOGRAM COMPLETE
Area-P 1/2: 2.82 cm2
S' Lateral: 3.7 cm

## 2021-03-03 MED ORDER — REGADENOSON 0.4 MG/5ML IV SOLN
INTRAVENOUS | Status: AC
  Administered 2021-03-03: 0.4 mg via INTRAVENOUS
  Filled 2021-03-03: qty 5

## 2021-03-03 MED ORDER — TECHNETIUM TC 99M TETROFOSMIN IV KIT
10.0000 | PACK | Freq: Once | INTRAVENOUS | Status: AC | PRN
  Administered 2021-03-03: 10.1 via INTRAVENOUS

## 2021-03-03 MED ORDER — TECHNETIUM TC 99M TETROFOSMIN IV KIT
30.0000 | PACK | Freq: Once | INTRAVENOUS | Status: AC | PRN
  Administered 2021-03-03: 28.8 via INTRAVENOUS

## 2021-03-03 MED ORDER — SODIUM CHLORIDE FLUSH 0.9 % IV SOLN
INTRAVENOUS | Status: AC
  Administered 2021-03-03: 10 mL via INTRAVENOUS
  Filled 2021-03-03: qty 10

## 2021-03-03 NOTE — Progress Notes (Signed)
I.V. removed @ 1050. Site clean, dry and intact. Pressure dressing applied. Cath intact after removal.  Celesta Gentile, RCS

## 2021-03-03 NOTE — Progress Notes (Signed)
*  PRELIMINARY RESULTS* Echocardiogram 2D Echocardiogram has been performed.  Stacey Drain 03/03/2021, 10:59 AM

## 2021-03-10 ENCOUNTER — Telehealth: Payer: Self-pay | Admitting: *Deleted

## 2021-03-10 NOTE — Telephone Encounter (Signed)
-----   Message from Jonelle Sidle, MD sent at 03/03/2021  6:28 PM EDT ----- Results reviewed. No substantial change in comparison with prior Myoview. Essentially large infarct scar without large ischemic territory. LVEF was 50-55% by concurrent echocardiogram. Schedule office visit in the next few weeks to review symptoms and medications.

## 2021-03-10 NOTE — Telephone Encounter (Signed)
Patient informed and verbalized understanding of plan. Copy sent to PCP 

## 2021-03-10 NOTE — Telephone Encounter (Signed)
-----   Message from Jonelle Sidle, MD sent at 03/03/2021 12:59 PM EDT ----- Results reviewed.  LVEF low normal range at this point, 50 to 55% with wall motion abnormalities consistent with known ischemic cardiomyopathy.  No major valvular abnormalities.  Await stress test results.

## 2021-03-11 IMAGING — XA DG MYELOGRAPHY LUMBAR INJ LUMBOSACRAL
13 of 17 series · 13 of 17 positions shown · non-contrast
Comparison: Lumbar spine MRI 09/25/2018

CLINICAL DATA: Right low back pain and right leg pain.
TECHNIQUE: Contiguous axial images were obtained through the Lumbar spine after
the intrathecal infusion of infusion. Coronal and sagittal
reconstructions were obtained of the axial image sets.

[Series 1: vasc extremity · 1 of 1 slices shown (1 of 10)]
[im 1/1]
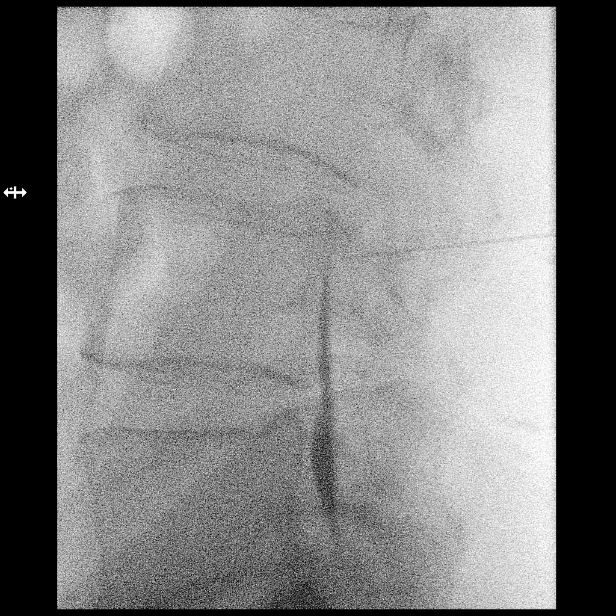

[Series 1: w lumbar spine lat · 0.15mm/px · 1 of 1 slices shown]
[im 1/1]
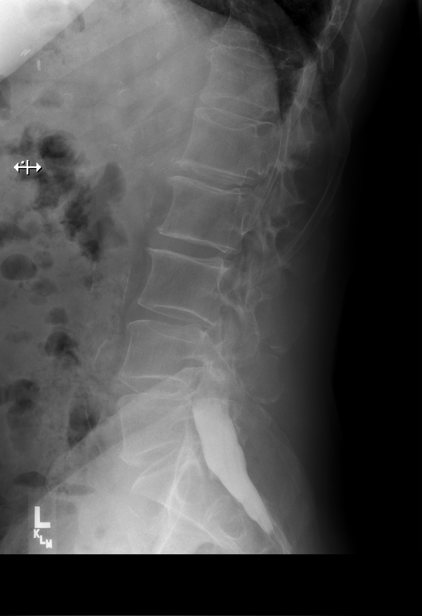

[Series 2: w lumbar spine flexion · 0.15mm/px · 1 of 1 slices shown]
[im 1/1]
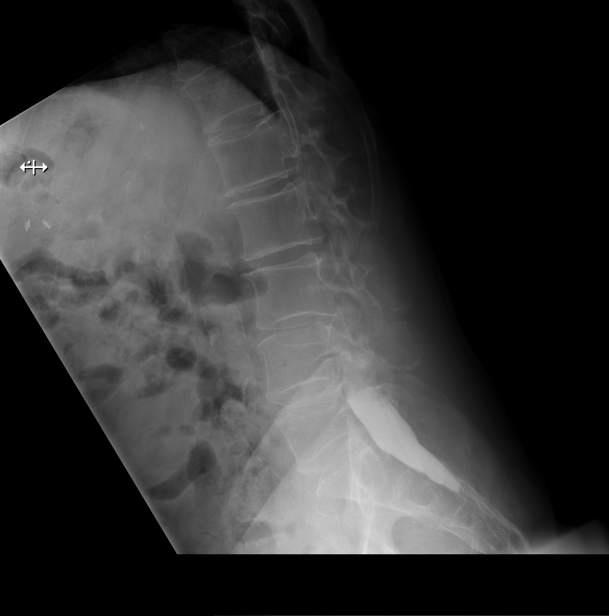

[Series 3: vasc extremity · 1 of 1 slices shown (2 of 10)]
[im 1/1]
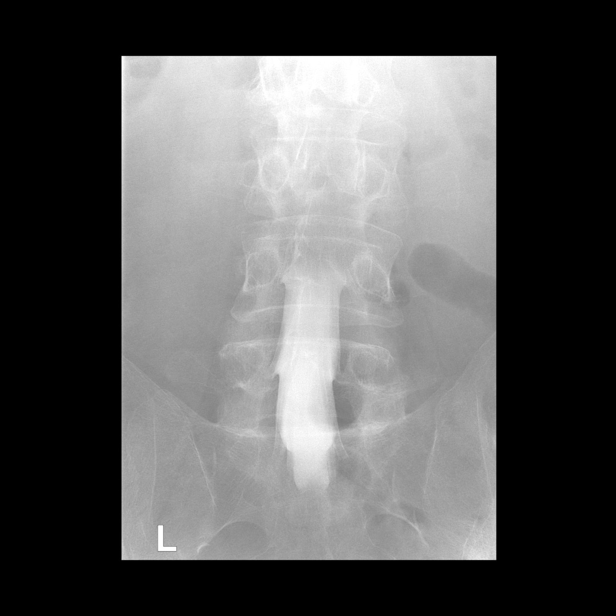

[Series 3: w lumbar spine extension · 0.15mm/px · 1 of 1 slices shown]
[im 1/1]
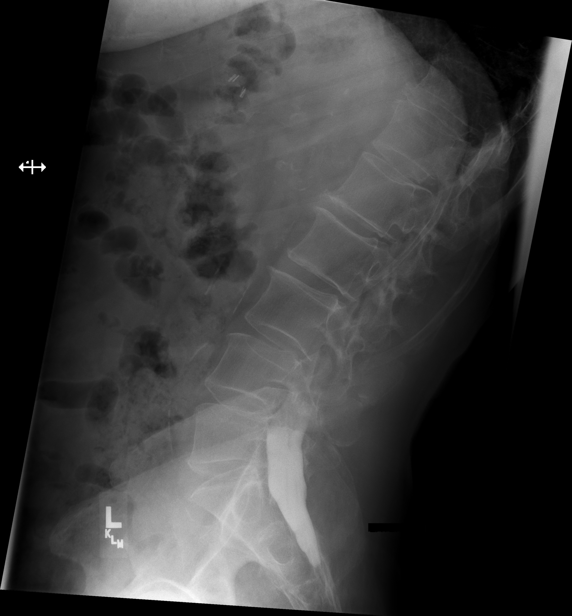

[Series 5: vasc extremity · 1 of 1 slices shown (3 of 10)]
[im 1/1]
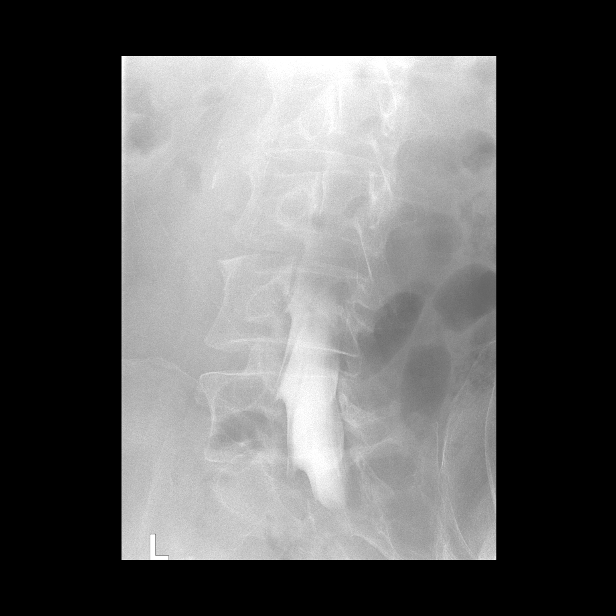

[Series 6: vasc extremity · 1 of 1 slices shown (4 of 10)]
[im 1/1]
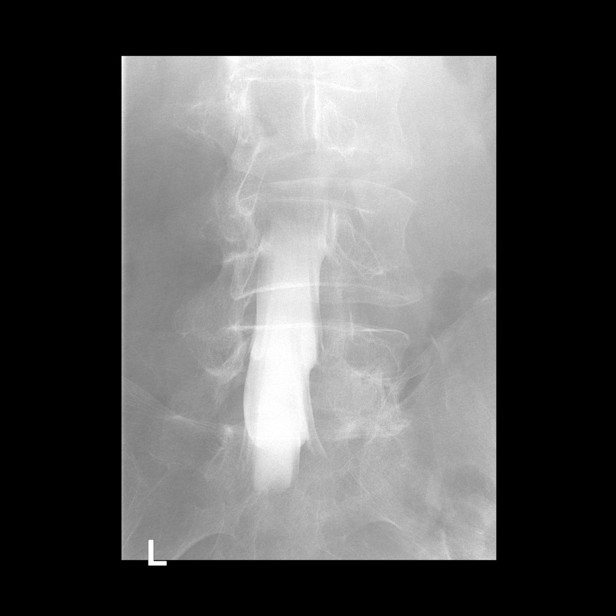

[Series 7: vasc extremity · 1 of 1 slices shown (5 of 10)]
[im 1/1]
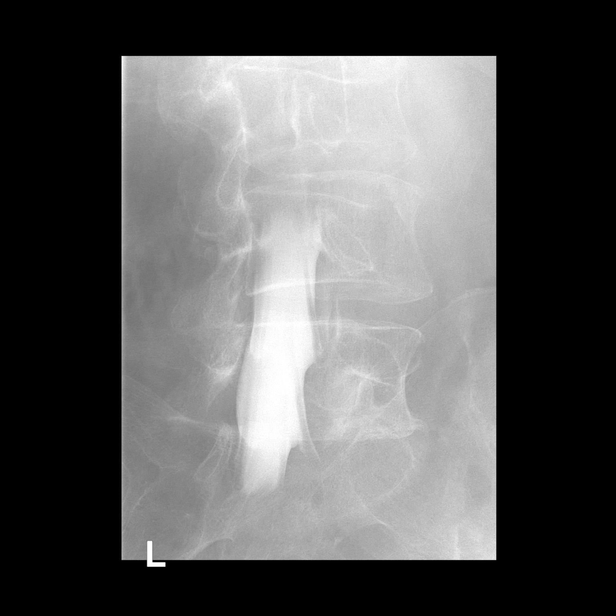

[Series 9: vasc extremity · 1 of 1 slices shown (6 of 10)]
[im 1/1]
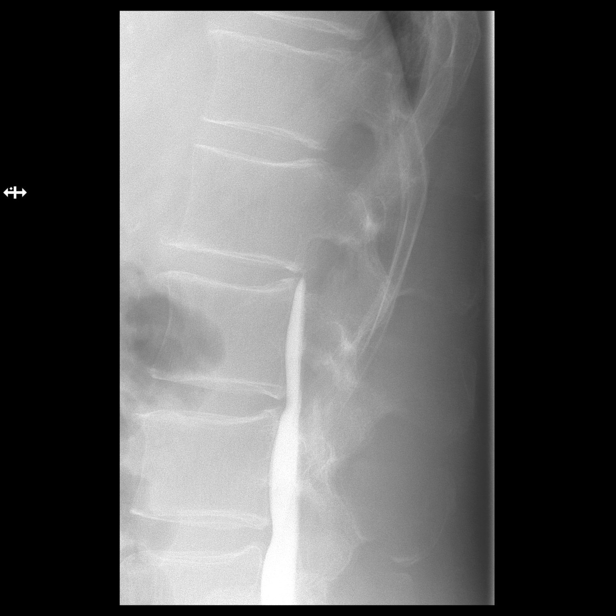

[Series 10: vasc extremity · 1 of 1 slices shown (7 of 10)]
[im 1/1]
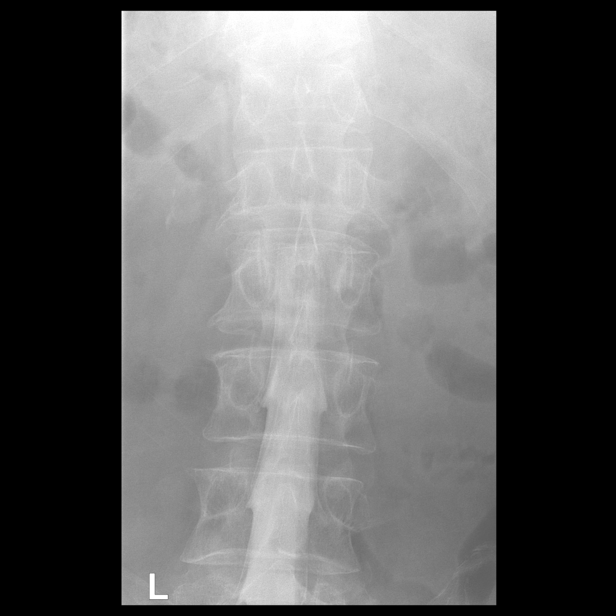

[Series 11: vasc extremity · 1 of 1 slices shown (8 of 10)]
[im 1/1]
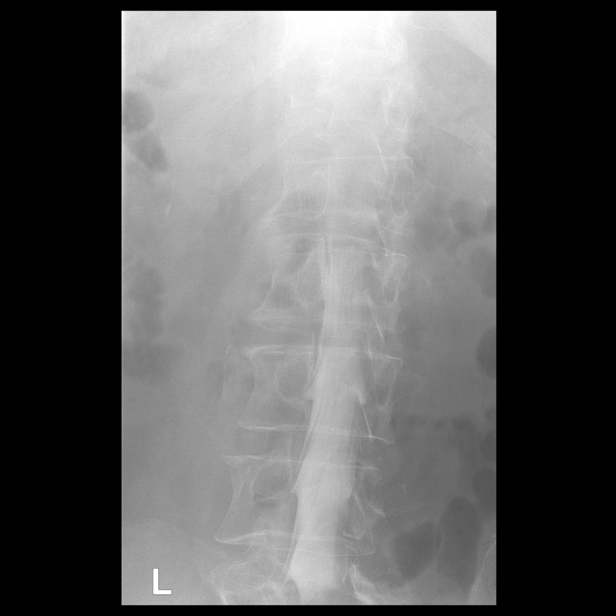

[Series 13: vasc extremity · 1 of 1 slices shown (9 of 10)]
[im 1/1]
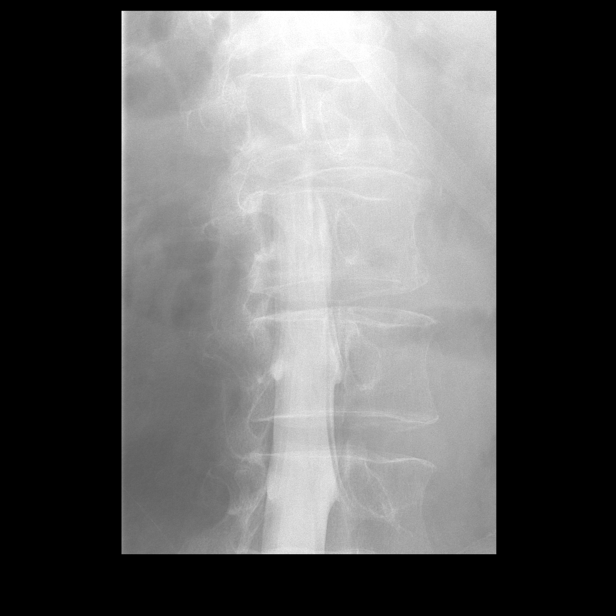

[Series 14: vasc extremity · 1 of 1 slices shown (10 of 10)]
[im 1/1]
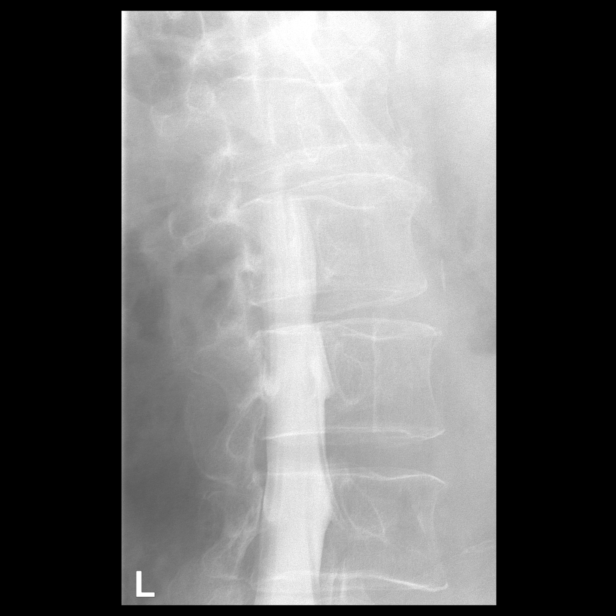

[13 of 17 positions shown; findings below may reference images not displayed]

EXAM:
LUMBAR MYELOGRAM

FLUOROSCOPY TIME:  Fluoroscopy Time: 24 seconds

Radiation Exposure Index: 326.88 microGray*m^2

PROCEDURE:
After thorough discussion of risks and benefits of the procedure
including bleeding, infection, injury to nerves, blood vessels,
adjacent structures as well as headache and CSF leak, written and
oral informed consent was obtained. Consent was obtained by Dr.
Marie Grace Servano. Time out form was completed.

Patient was positioned prone on the fluoroscopy table. Local
anesthesia was provided with 1% lidocaine without epinephrine after
prepped and draped in the usual sterile fashion. Puncture was
performed at L3-4 using a 3 1/2 inch 22-gauge spinal needle via a
left interlaminar approach. Using a single pass through the dura,
the needle was placed within the thecal sac, with return of clear
CSF. 15 mL of Isovue J-Z00 was injected into the thecal sac, with
normal opacification of the nerve roots and cauda equina consistent
with free flow within the subarachnoid space.

I personally performed the lumbar puncture and administered the
intrathecal contrast. I also personally supervised acquisition of
the myelogram images.
FINDINGS: LUMBAR MYELOGRAM FINDINGS:

There are 5 non rib-bearing lumbar type vertebrae. There is mild
lumbar dextroscoliosis, and there is mild straightening of the
normal lumbar lordosis. No listhesis is seen, and no abnormal motion
is evident on flexion and extension radiographs. Shallow ventral
extradural defects are present at L2-3, L3-4, and L4-5 without
evidence of significant stenosis. The nerve root sleeves fill
symmetrically.

CT LUMBAR MYELOGRAM FINDINGS:

There is mild lumbar dextroscoliosis, and there is chronic
straightening of the lumbar lordosis without significant listhesis.
No fracture or suspicious osseous lesion is identified. The conus
medullaris terminates at L1-2. Low-density lesions are partially
visualized in both kidneys compatible with cysts, including a
complex cyst in the upper pole of the left kidney with internal
septation and thin calcification (versus 2 adjacent cysts) which
measures up to 3.8 cm on axial images, similar to that reported on a
3618 abdominal CT. Abdominal aortic atherosclerosis is noted without
aneurysm.

T11-12 and T12-L1: Negative.

L1-2: Mild disc bulging and mild left facet arthrosis without
stenosis, unchanged.

L2-3: Mild disc bulging and a right foraminal to extraforaminal disc
protrusion/extrusion result in moderate right neural foraminal
stenosis with potential right L2 nerve impingement. The disc
herniation appears slightly smaller than on the prior MRI. No spinal
stenosis.

L3-4: Mild disc bulging and a right foraminal to extraforaminal disc
osteophyte complex result in mild right neural foraminal stenosis
without spinal stenosis, unchanged.

L4-5: Mild circumferential disc bulging and a small right foraminal
disc protrusion result in mild-to-moderate right and mild left
neural foraminal stenosis without spinal stenosis, unchanged.

L5-S1: Mild-to-moderate disc space narrowing. Disc bulging, endplate
spurring, asymmetric right-sided disc space height loss, and mild
right facet arthrosis result in moderate right neural foraminal
stenosis with potential right L5 nerve root impingement, unchanged.
No spinal stenosis.
IMPRESSION: 1. Slightly decreased size of right foraminal/extraforaminal disc
herniation at L2-3. Persistent moderate right neural foraminal
stenosis with potential right L2 nerve impingement.
2. Unchanged moderate right neural foraminal stenosis at L5-S1 with
potential L5 nerve root impingement
3. Unchanged mild-to-moderate right neural foraminal stenosis at
L4-5 due to a small disc protrusion.
4.  Aortic Atherosclerosis (4VIFJ-1LL.L).

## 2021-03-11 IMAGING — CT CT LUMBAR SPINE WITH CONTRAST
1 of 7 series · 6 of 14 positions shown, 8 images · non-contrast
Comparison: Lumbar spine MRI 09/25/2018

CLINICAL DATA: Right low back pain and right leg pain.
TECHNIQUE: Contiguous axial images were obtained through the Lumbar spine after
the intrathecal infusion of infusion. Coronal and sagittal
reconstructions were obtained of the axial image sets.

[Series 3: l spine soft · axial · 0.40mm/px · z∈[-313,-115]mm · 6 of 94 slices shown, 8 images]
[im 14/94  soft-tissue]
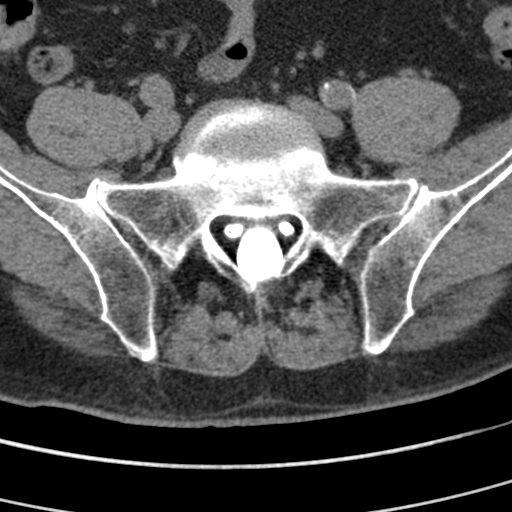
[im 14/94  bone]
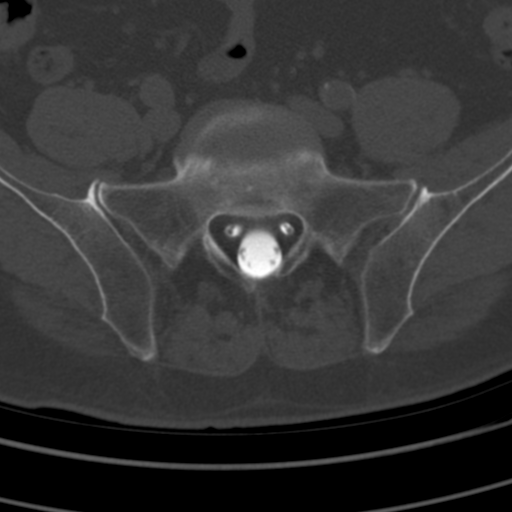
[im 27/94  bone]
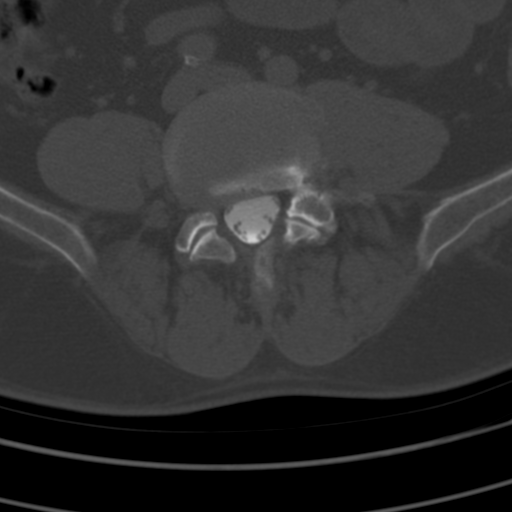
[im 40/94  bone]
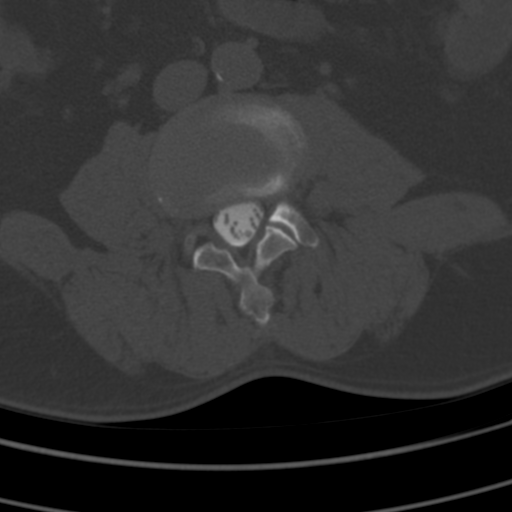
[im 54/94  bone]
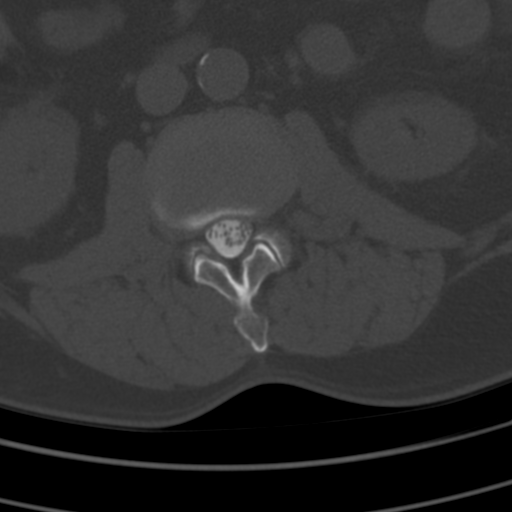
[im 67/94  soft-tissue]
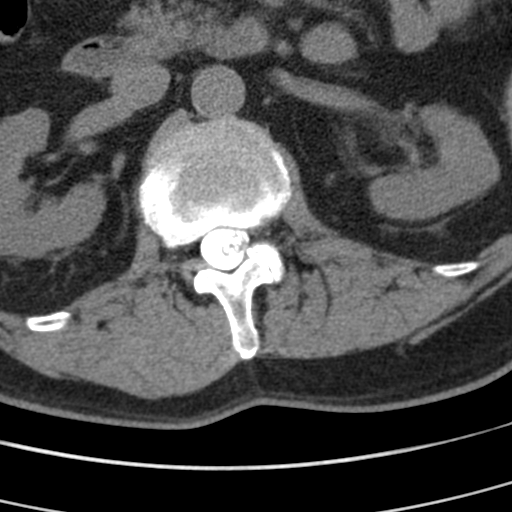
[im 67/94  bone]
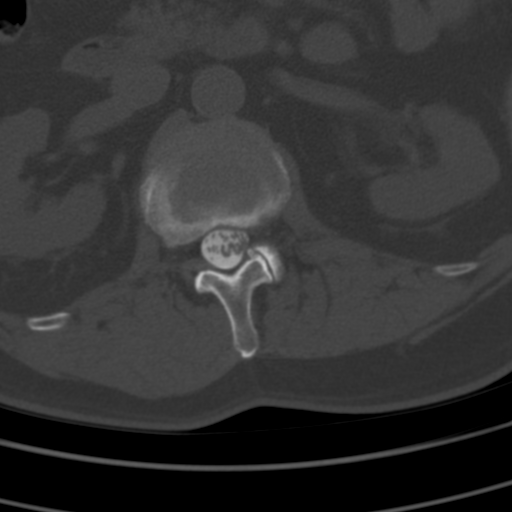
[im 80/94  bone]
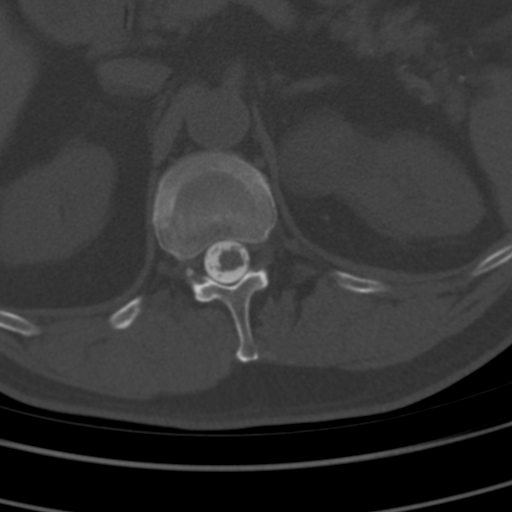

[6 of 14 positions shown; findings below may reference images not displayed]

EXAM:
LUMBAR MYELOGRAM

FLUOROSCOPY TIME:  Fluoroscopy Time: 24 seconds

Radiation Exposure Index: 326.88 microGray*m^2

PROCEDURE:
After thorough discussion of risks and benefits of the procedure
including bleeding, infection, injury to nerves, blood vessels,
adjacent structures as well as headache and CSF leak, written and
oral informed consent was obtained. Consent was obtained by Dr.
Marie Grace Servano. Time out form was completed.

Patient was positioned prone on the fluoroscopy table. Local
anesthesia was provided with 1% lidocaine without epinephrine after
prepped and draped in the usual sterile fashion. Puncture was
performed at L3-4 using a 3 1/2 inch 22-gauge spinal needle via a
left interlaminar approach. Using a single pass through the dura,
the needle was placed within the thecal sac, with return of clear
CSF. 15 mL of Isovue J-Z00 was injected into the thecal sac, with
normal opacification of the nerve roots and cauda equina consistent
with free flow within the subarachnoid space.

I personally performed the lumbar puncture and administered the
intrathecal contrast. I also personally supervised acquisition of
the myelogram images.
FINDINGS: LUMBAR MYELOGRAM FINDINGS:

There are 5 non rib-bearing lumbar type vertebrae. There is mild
lumbar dextroscoliosis, and there is mild straightening of the
normal lumbar lordosis. No listhesis is seen, and no abnormal motion
is evident on flexion and extension radiographs. Shallow ventral
extradural defects are present at L2-3, L3-4, and L4-5 without
evidence of significant stenosis. The nerve root sleeves fill
symmetrically.

CT LUMBAR MYELOGRAM FINDINGS:

There is mild lumbar dextroscoliosis, and there is chronic
straightening of the lumbar lordosis without significant listhesis.
No fracture or suspicious osseous lesion is identified. The conus
medullaris terminates at L1-2. Low-density lesions are partially
visualized in both kidneys compatible with cysts, including a
complex cyst in the upper pole of the left kidney with internal
septation and thin calcification (versus 2 adjacent cysts) which
measures up to 3.8 cm on axial images, similar to that reported on a
3618 abdominal CT. Abdominal aortic atherosclerosis is noted without
aneurysm.

T11-12 and T12-L1: Negative.

L1-2: Mild disc bulging and mild left facet arthrosis without
stenosis, unchanged.

L2-3: Mild disc bulging and a right foraminal to extraforaminal disc
protrusion/extrusion result in moderate right neural foraminal
stenosis with potential right L2 nerve impingement. The disc
herniation appears slightly smaller than on the prior MRI. No spinal
stenosis.

L3-4: Mild disc bulging and a right foraminal to extraforaminal disc
osteophyte complex result in mild right neural foraminal stenosis
without spinal stenosis, unchanged.

L4-5: Mild circumferential disc bulging and a small right foraminal
disc protrusion result in mild-to-moderate right and mild left
neural foraminal stenosis without spinal stenosis, unchanged.

L5-S1: Mild-to-moderate disc space narrowing. Disc bulging, endplate
spurring, asymmetric right-sided disc space height loss, and mild
right facet arthrosis result in moderate right neural foraminal
stenosis with potential right L5 nerve root impingement, unchanged.
No spinal stenosis.
IMPRESSION: 1. Slightly decreased size of right foraminal/extraforaminal disc
herniation at L2-3. Persistent moderate right neural foraminal
stenosis with potential right L2 nerve impingement.
2. Unchanged moderate right neural foraminal stenosis at L5-S1 with
potential L5 nerve root impingement
3. Unchanged mild-to-moderate right neural foraminal stenosis at
L4-5 due to a small disc protrusion.
4.  Aortic Atherosclerosis (4VIFJ-1LL.L).

## 2021-04-03 NOTE — Progress Notes (Addendum)
Cardiology Office Note  Date: 04/03/2021   ID: [name], DOB 06/28/1951, MRN [id number]  PCP:  [name], MD  Cardiologist:  Nona Dell, MD Electrophysiologist:  None   Chief Complaint: Stress test results  History of Present Illness: Steven Haas is a 70 y.o. male with a history of HTN, CAD, GERD, CKD, ischemic cardiomyopathy, syncope, DM2.  Previous DES to mid LAD with subsequent Cutting Balloon angioplasty for in-stent restenosis in 2013 as well as DES to ostial circumflex 2017.  He was last seen by Dr. Diona Browner on 02/08/2021.  He had been experiencing some anginal symptoms with shortness of breath worsening by working in the heat and related today.  Had relief with nitroglycerin.  Previous echocardiogram October 2020 revealed EF 45 to 50% with positive WMA's consistent with ischemic cardiomyopathy, moderate left atrial enlargement and trivial mitral and tricuspid regurgitation.  Lexiscan Myoview and echocardiogram were ordered.  He was to continue aspirin, Plavix, Ismo, Altace, Pravachol.  He was on Actos with follow-up by PCP interval lab work was requested.  Likely a good candidate for SGLT2 inhibitor.  He is here today for follow-up of recent Lexiscan Myoview and echocardiogram results.  We reviewed the results together.  Patient verbalizes understanding.  He denies any current anginal or exertional symptoms.  Denies any DOE or SOB, orthostatic symptoms i.e. lightheadedness, dizziness, presyncope or syncopal episodes.  Denies any PND, orthopnea.  No palpitations or arrhythmias.  No CVA or TIA-like symptoms.  States he has been doing very well without any issues.  We discussed SGLT 2 inhibitor initiation.      Past Medical History:  Diagnosis Date   CKD (chronic kidney disease), stage II    Coronary atherosclerosis of native coronary artery    Remote MI 2004 s/p LAD PCI in [place], s/p DES to mid LAD for ISR 01/2010, s/p cutting balloon PCI to prox LAD for ISR  11/16/11, DES ostial circumflex 05/2016 [facility]   Essential hypertension    GERD (gastroesophageal reflux disease)    Ischemic cardiomyopathy    LVEF 40-45% with apical akinesis with scar as well as anterolateral HK 02/2011, LVEF 50% at cath 06/2013.   Sinus bradycardia    Metoprolol stopped 11/2011 due to symptomatic bradycardia.   Syncope    Status post bradycardia on high-dose blocker therapy   Type 2 diabetes mellitus (HCC)     Past Surgical History:  Procedure Laterality Date   ELBOW SURGERY     Left   LAPAROSCOPIC CHOLECYSTECTOMY  12/23/13   Dr. Laurel Dimmer   LEFT AND RIGHT HEART CATHETERIZATION WITH CORONARY ANGIOGRAM  06/09/2011   Procedure: LEFT AND RIGHT HEART CATHETERIZATION WITH CORONARY ANGIOGRAM;  Surgeon: Herby Abraham, MD;  Location: [facility] CATH LAB;  Service: Cardiovascular;;   LEFT HEART CATHETERIZATION WITH CORONARY ANGIOGRAM N/A 11/16/2011   Procedure: LEFT HEART CATHETERIZATION WITH CORONARY ANGIOGRAM;  Surgeon: Laurey Morale, MD;  Location: [facility] CATH LAB;  Service: Cardiovascular;  Laterality: N/A;   LEFT HEART CATHETERIZATION WITH CORONARY ANGIOGRAM N/A 06/24/2013   Procedure: LEFT HEART CATHETERIZATION WITH CORONARY ANGIOGRAM;  Surgeon: Rollene Rotunda, MD;  Location: [facility] CATH LAB;  Service: Cardiovascular;  Laterality: N/A;   NOSE SURGERY     Tumors   SHOULDER SURGERY     Right    Current Outpatient Medications  Medication Sig Dispense Refill   acetaminophen (TYLENOL) 500 MG tablet Take 500 mg by mouth every 6 (six) hours as needed.     aspirin  81 MG tablet Take 81 mg by mouth daily.     clopidogrel (PLAVIX) 75 MG tablet Take 75 mg by mouth daily.       doxazosin (CARDURA) 1 MG tablet Take 1 mg by mouth daily.     furosemide (LASIX) 40 MG tablet TAKE 1 TABLET BY MOUTH EVERY DAY IN THE MORNING 90 tablet 1   isosorbide mononitrate (ISMO) 20 MG tablet TAKE 1 TABLET (20 MG TOTAL) BY MOUTH 2 (TWO) TIMES DAILY AT 10 AM AND 5 PM. 180 tablet 1   NITROSTAT  0.4 MG SL tablet PLAEC 1 TAB UNDER TONGUE EVERY 5 MINS AS NEEDED FOR CHEST PAIN**MAX 3TABS/IN 15 MIN** 25 tablet 2   pantoprazole (PROTONIX) 40 MG tablet TAKE 1 TABLET DAILY AT 12 NOON 90 tablet 3   pioglitazone (ACTOS) 30 MG tablet Take 30 mg by mouth daily.      pravastatin (PRAVACHOL) 80 MG tablet Take 80 mg by mouth daily.      ramipril (ALTACE) 10 MG capsule Take 10 mg by mouth 2 (two) times daily.      No current facility-administered medications for this visit.   Allergies:  Patient has no known allergies.   Social History: The patient  reports that he has never smoked. He has never used smokeless tobacco. He reports that he does not drink alcohol and does not use drugs.   Family History: The patient's family history includes Heart attack in his father; Hypertension in his sister, sister, sister, and sister; Stroke in his father.   ROS:  Please see the history of present illness. Otherwise, complete review of systems is positive for none.  All other systems are reviewed and negative.   Physical Exam: VS:  There were no vitals taken for this visit., BMI There is no height or weight on file to calculate BMI.  Wt Readings from Last 3 Encounters:  02/08/21 207 lb 3.2 oz (94 kg)  10/21/19 208 lb (94.3 kg)  04/09/19 205 lb (93 kg)    General: Patient appears comfortable at rest. Neck: Supple, no elevated JVP or carotid bruits, no thyromegaly. Lungs: Clear to auscultation, nonlabored breathing at rest. Cardiac: Regular rate and rhythm, no S3 or significant systolic murmur, no pericardial rub. Extremities: No pitting edema, distal pulses 2+. Skin: Warm and dry. Musculoskeletal: No kyphosis. Neuropsychiatric: Alert and oriented x3, affect grossly appropriate.  ECG:  EKG February 08, 2021 sinus bradycardia rate of 59, septal infarct, age undetermined  Recent Labwork: No results found for requested labs within last 8760 hours.     Component Value Date/Time   CHOL 139 06/22/2013 0045    TRIG 142 06/22/2013 0045   HDL 36 (L) 06/22/2013 0045   CHOLHDL 3.9 06/22/2013 0045   VLDL 28 06/22/2013 0045   LDLCALC 75 06/22/2013 0045    Other Studies Reviewed Today:  Echocardiogram 03/03/2021  1. Left ventricular ejection fraction, by estimation, is 50 to 55%. The  left ventricle has low normal function. The left ventricle demonstrates  regional wall motion abnormalities (see scoring diagram/findings for  description). Left ventricular diastolic   parameters are indeterminate.   2. Right ventricular systolic function is normal. The right ventricular  size is normal. Tricuspid regurgitation signal is inadequate for assessing  PA pressure.   3. Left atrial size was moderately dilated.   4. Right atrial size was mildly dilated.   5. The mitral valve is grossly normal. No evidence of mitral valve  regurgitation.   6. The aortic valve  is tricuspid. Aortic valve regurgitation is not  visualized.   7. The inferior vena cava is normal in size with greater than 50%  respiratory variability, suggesting right atrial pressure of 3 mmHg.   Comparison(s): Prior images reviewed side by side. LVEF has improved  somewhat.      Eugenie Birks  Myoview 03/03/2021 Narrative & Impression  No diagnostic ST segment changes to suggest ischemia with rare PVCs. Large, severe intensity defect involving the apical to basal anterior, septal, and to lesser degree inferior walls consistent with large infarct scar but no substantial degree of ischemia. This is an intermediate risk study. Nuclear stress EF: 37%.     Lexiscan Myoview 06/03/2018: There was no ST segment deviation noted during stress. Findings consistent with extensive prior myocardial infarction including the inferior, inferoseptal, septal, anteroseptal, and apical walls without current ischemia. This is an intermediate risk study. Risk based on decreased LVEF, there is no significant myocardium currently at jeopardy. The left ventricular  ejection fraction is moderately decreased (30-44%).   Echocardiogram 05/07/2019:  1. Left ventricular ejection fraction, by visual estimation, is 45 to  50%. The left ventricle has mildly decreased function. Normal left  ventricular size. There is borderline left ventricular hypertrophy.   2. Multiple segmental abnormalities exist. See findings. Consistent with  ischemic cardiomyopathy.   3. Global right ventricle has normal systolic function.The right  ventricular size is normal. No increase in right ventricular wall  thickness.   4. Left atrial size was moderately dilated.   5. Right atrial size was normal.   6. Mild aortic valve annular calcification.   7. Mild mitral annular calcification.   8. The mitral valve is grossly normal. Trace mitral valve regurgitation.   9. The tricuspid valve is grossly normal. Tricuspid valve regurgitation  is trivial.  10. The aortic valve is tricuspid Aortic valve regurgitation was not  visualized by color flow Doppler.  11. The pulmonic valve was grossly normal. Pulmonic valve regurgitation is  trivial by color flow Doppler.  12. Normal pulmonary artery systolic pressure.  13. The tricuspid regurgitant velocity is 1.47 m/s, and with an assumed  right atrial pressure of 10 mmHg, the estimated right ventricular systolic  pressure is normal at 18.6 mmHg.   Assessment and Plan:  1. CAD in native artery   2. Ischemic cardiomyopathy   3. CKD (chronic kidney disease) stage 2, GFR 60-89 ml/min   4. Type 2 diabetes mellitus with diabetic nephropathy, unspecified whether long term insulin use (HCC)    1. CAD in native artery He denies any anginal or exertional symptoms.  Recent stress test was negative for current ischemia.  Continue Imdur 20 mg p.o. twice daily.  Continue aspirin 81 mg daily, continue Plavix 75 mg daily, continue sublingual nitroglycerin as needed, pravastatin 80 mg daily.  Start Farxiga 10 mg p.o. daily.  Get basic metabolic panel and  magnesium in 2 weeks after starting medication.  2. Ischemic cardiomyopathy Recent echocardiogram on 03/03/2021 EF 50 to 55%.  Positive for WMA's.  Indeterminate diastolic parameters.  LA moderately dilated, RA mildly dilated.  EF improved since prior echo October 2020 when EF was 45 to 50%  3. CKD (chronic kidney disease) stage 2, GFR 60-89 ml/min Last creatinine and GFR July 2021: 1.34 creatinine and GFR 55  4. Type 2 diabetes mellitus with diabetic nephropathy, unspecified whether long term insulin use (HCC) He is following with PCP for diabetes.  He continues on Actos 30 mg daily.  Medication Adjustments/Labs and Tests  Ordered: Current medicines are reviewed at length with the patient today.  Concerns regarding medicines are outlined above.   Disposition: Follow-up with Dr. Diona Browner or APP 6 months  Signed, Rennis Harding, NP 04/03/2021 4:41 PM    [facility] [address] [place], [place], [place] [address] Phone: [phone]; Fax: [phone]

## 2021-04-04 ENCOUNTER — Other Ambulatory Visit: Payer: Self-pay

## 2021-04-04 ENCOUNTER — Ambulatory Visit (INDEPENDENT_AMBULATORY_CARE_PROVIDER_SITE_OTHER): Payer: Medicare Other | Admitting: Family Medicine

## 2021-04-04 ENCOUNTER — Encounter: Payer: Self-pay | Admitting: Family Medicine

## 2021-04-04 VITALS — BP 142/70 | HR 62 | Ht 76.0 in | Wt 211.4 lb

## 2021-04-04 DIAGNOSIS — I2510 Atherosclerotic heart disease of native coronary artery without angina pectoris: Secondary | ICD-10-CM

## 2021-04-04 DIAGNOSIS — N182 Chronic kidney disease, stage 2 (mild): Secondary | ICD-10-CM

## 2021-04-04 DIAGNOSIS — I255 Ischemic cardiomyopathy: Secondary | ICD-10-CM

## 2021-04-04 DIAGNOSIS — E1121 Type 2 diabetes mellitus with diabetic nephropathy: Secondary | ICD-10-CM | POA: Diagnosis not present

## 2021-04-04 MED ORDER — DAPAGLIFLOZIN PROPANEDIOL 10 MG PO TABS
10.0000 mg | ORAL_TABLET | Freq: Every day | ORAL | 6 refills | Status: DC
Start: 2021-04-04 — End: 2021-05-02

## 2021-04-04 MED ORDER — DAPAGLIFLOZIN PROPANEDIOL 10 MG PO TABS: 10.0000 mg | ORAL_TABLET | Freq: Every day | ORAL | 0 refills | Status: DC

## 2021-04-04 NOTE — Patient Instructions (Signed)
Medication Instructions:  Begin Farxiga 10mg  daily.  Continue all other medications.     Labwork: BMET, Mg - orders given today.  Please do in 2 weeks (around 10/3) Office will contact with results via phone or letter.     Testing/Procedures: none  Follow-Up: 6 months   Any Other Special Instructions Will Be Listed Below (If Applicable).   If you need a refill on your cardiac medications before your next appointment, please call your pharmacy.

## 2021-05-01 ENCOUNTER — Other Ambulatory Visit: Payer: Self-pay | Admitting: Family Medicine

## 2021-05-03 ENCOUNTER — Telehealth: Payer: Self-pay | Admitting: Cardiology

## 2021-05-03 ENCOUNTER — Encounter: Payer: Self-pay | Admitting: *Deleted

## 2021-05-03 NOTE — Telephone Encounter (Signed)
Reports lab work was done 2 weeks ago at PCP office Will request  Patient aware

## 2021-05-03 NOTE — Telephone Encounter (Signed)
Patient calling for lab work results

## 2021-05-16 ENCOUNTER — Telehealth: Payer: Self-pay | Admitting: Cardiology

## 2021-05-16 MED ORDER — DAPAGLIFLOZIN PROPANEDIOL 10 MG PO TABS: 10.0000 mg | ORAL_TABLET | Freq: Every day | ORAL | 0 refills | Status: DC

## 2021-05-16 MED ORDER — DAPAGLIFLOZIN PROPANEDIOL 10 MG PO TABS: 10.0000 mg | ORAL_TABLET | Freq: Every day | ORAL | 2 refills | Status: DC

## 2021-05-16 NOTE — Telephone Encounter (Signed)
Pt c/o medication issue:  1. Name of Medication: FARXIGA 10 MG TABS tablet  2. How are you currently taking this medication (dosage and times per day)? As directed   3. Are you having a reaction (difficulty breathing--STAT)?   4. What is your medication issue? Patient said he will run out of his medication at the end of this week. 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  He is not sure what to do. Please advise

## 2021-05-16 NOTE — Telephone Encounter (Signed)
Advised that he can pick up samples  Says that [employer] are supposed to reach out to our office for additional information/forms to be completed. Advised that we have not received them yet but I would go ahead and fax pre scriber section of application with new prescription.

## 2021-05-17 ENCOUNTER — Telehealth: Payer: Self-pay | Admitting: *Deleted

## 2021-05-17 NOTE — Telephone Encounter (Signed)
Lesle Chris, LPN  67/12/7207 47:09 PM EDT Back to Top    Notified.

## 2021-05-17 NOTE — Telephone Encounter (Signed)
-----   Message from Netta Neat., NP sent at 05/15/2021  7:15 PM EDT ----- Renal function is essentially unchanged since starting Farxiga.  Continue Farxiga. Netta Neat, NP  05/15/2021 7:15 PM

## 2021-08-01 ENCOUNTER — Telehealth: Payer: Self-pay | Admitting: Cardiology

## 2021-08-01 MED ORDER — ISOSORBIDE MONONITRATE 20 MG PO TABS: 20.0000 mg | ORAL_TABLET | Freq: Two times a day (BID) | ORAL | 1 refills | Status: DC

## 2021-08-01 NOTE — Telephone Encounter (Signed)
°*  STAT* If patient is at the pharmacy, call can be transferred to refill team.   1. Which medications need to be refilled? (please list name of each medication and dose if known)  isosorbide mononitrate (ISMO) 20 MG tablet  2. Which pharmacy/location (including street and city if local pharmacy) is medication to be sent to? [id number] - [place], [place] - [address]  3. Do they need a 30 day or 90 day supply? 90 with refills

## 2021-08-01 NOTE — Telephone Encounter (Signed)
Completed.

## 2021-08-29 ENCOUNTER — Ambulatory Visit (INDEPENDENT_AMBULATORY_CARE_PROVIDER_SITE_OTHER): Payer: Medicare Other | Admitting: Cardiology

## 2021-08-29 ENCOUNTER — Encounter: Payer: Self-pay | Admitting: Cardiology

## 2021-08-29 VITALS — BP 128/72 | HR 61 | Ht 76.0 in | Wt 204.2 lb

## 2021-08-29 DIAGNOSIS — I25119 Atherosclerotic heart disease of native coronary artery with unspecified angina pectoris: Secondary | ICD-10-CM | POA: Diagnosis not present

## 2021-08-29 DIAGNOSIS — Z01810 Encounter for preprocedural cardiovascular examination: Secondary | ICD-10-CM

## 2021-08-29 DIAGNOSIS — I255 Ischemic cardiomyopathy: Secondary | ICD-10-CM | POA: Diagnosis not present

## 2021-08-29 DIAGNOSIS — N1831 Chronic kidney disease, stage 3a: Secondary | ICD-10-CM

## 2021-08-29 NOTE — Patient Instructions (Signed)
Medication Instructions:  Your physician recommends that you continue on your current medications as directed. Please refer to the Current Medication list given to you today.   Labwork: None today  Testing/Procedures: None today  Follow-Up: 6 months  Any Other Special Instructions Will Be Listed Below (If Applicable).  If you need a refill on your cardiac medications before your next appointment, please call your pharmacy.  

## 2021-08-29 NOTE — Progress Notes (Signed)
Cardiology Office Note  Date: 08/29/2021   ID: Steven Haas, DOB [birth year], MRN [id number]  PCP:  [name], MD  Cardiologist:  Nona Dell, MD Electrophysiologist:  None   Chief Complaint  Patient presents with   Cardiac follow-up    History of Present Illness: Steven Haas is a 71 y.o. male last seen in September 2022 by Mr. Vincenza Hews NP.  Records indicate plan for lumbar laminectomy under general anesthesia with Dr. Dorise Hiss in the [facility] system, surgery scheduled for March 9.  From a cardiac perspective, he does not describe any active angina symptoms or nitroglycerin use with typical activities.  Still working for a funeral home.  He reports NYHA class II dyspnea, describes activities meeting or exceeding 4 METS.  He is limited by right leg and ankle pain with walking and standing.  Cardiac structural and ischemic testing from August 2022 outlined below.  Myoview showed large region of predominantly anterior infarct scar with no residual ischemia.  LVEF was 50 to 55% by echocardiogram.  RCRI perioperative cardiac risk calculator indicates class III, 6.6% chance of major adverse cardiac event (intermediate).  I reviewed his medications which are noted below.  Past Medical History:  Diagnosis Date   CKD (chronic kidney disease), stage II    Coronary atherosclerosis of native coronary artery    Remote MI 2004 s/p LAD PCI in [place], s/p DES to mid LAD for ISR 01/2010, s/p cutting balloon PCI to prox LAD for ISR 11/16/11, DES ostial circumflex 05/2016 [facility]   Essential hypertension    GERD (gastroesophageal reflux disease)    Ischemic cardiomyopathy    Sinus bradycardia    Metoprolol stopped 11/2011 due to symptomatic bradycardia.   Type 2 diabetes mellitus (HCC)     Past Surgical History:  Procedure Laterality Date   ELBOW SURGERY     Left   LAPAROSCOPIC CHOLECYSTECTOMY  12/23/13   Dr. Laurel Dimmer   LEFT AND RIGHT HEART CATHETERIZATION WITH  CORONARY ANGIOGRAM  06/09/2011   Procedure: LEFT AND RIGHT HEART CATHETERIZATION WITH CORONARY ANGIOGRAM;  Surgeon: Herby Abraham, MD;  Location: [facility] CATH LAB;  Service: Cardiovascular;;   LEFT HEART CATHETERIZATION WITH CORONARY ANGIOGRAM N/A 11/16/2011   Procedure: LEFT HEART CATHETERIZATION WITH CORONARY ANGIOGRAM;  Surgeon: Laurey Morale, MD;  Location: [facility] CATH LAB;  Service: Cardiovascular;  Laterality: N/A;   LEFT HEART CATHETERIZATION WITH CORONARY ANGIOGRAM N/A 06/24/2013   Procedure: LEFT HEART CATHETERIZATION WITH CORONARY ANGIOGRAM;  Surgeon: Rollene Rotunda, MD;  Location: [facility] CATH LAB;  Service: Cardiovascular;  Laterality: N/A;   NOSE SURGERY     Tumors   SHOULDER SURGERY     Right    Current Outpatient Medications  Medication Sig Dispense Refill   acetaminophen (TYLENOL) 500 MG tablet Take 500 mg by mouth every 6 (six) hours as needed.     aspirin 81 MG tablet Take 81 mg by mouth daily.     clopidogrel (PLAVIX) 75 MG tablet Take 75 mg by mouth daily.       dapagliflozin propanediol (FARXIGA) 10 MG TABS tablet Take 1 tablet (10 mg total) by mouth daily. 7 tablet 0   doxazosin (CARDURA) 1 MG tablet Take 1 mg by mouth daily.     furosemide (LASIX) 40 MG tablet TAKE 1 TABLET BY MOUTH EVERY DAY IN THE MORNING 90 tablet 1   isosorbide mononitrate (ISMO) 20 MG tablet Take 1 tablet (20 mg total) by mouth 2 (two) times daily at 10  AM and 5 PM. 180 tablet 1   NITROSTAT 0.4 MG SL tablet PLAEC 1 TAB UNDER TONGUE EVERY 5 MINS AS NEEDED FOR CHEST PAIN**MAX 3TABS/IN 15 MIN** 25 tablet 2   pantoprazole (PROTONIX) 40 MG tablet TAKE 1 TABLET DAILY AT 12 NOON 90 tablet 3   pioglitazone (ACTOS) 30 MG tablet Take 30 mg by mouth daily.      pravastatin (PRAVACHOL) 80 MG tablet Take 80 mg by mouth daily.      ramipril (ALTACE) 10 MG capsule Take 10 mg by mouth 2 (two) times daily.      No current facility-administered medications for this visit.   Allergies:  Patient has no known allergies.    ROS: No palpitations or syncope.  Physical Exam: VS:  BP 128/72    Pulse 61    Ht 6\' 4"  (1.93 m)    Wt 204 lb 3.2 oz (92.6 kg)    SpO2 98%    BMI 24.86 kg/m , BMI Body mass index is 24.86 kg/m.  Wt Readings from Last 3 Encounters:  08/29/21 204 lb 3.2 oz (92.6 kg)  04/04/21 211 lb 6.4 oz (95.9 kg)  02/08/21 207 lb 3.2 oz (94 kg)    General: Patient appears comfortable at rest. HEENT: Conjunctiva and lids normal, wearing a mask. Neck: Supple, no elevated JVP or carotid bruits, no thyromegaly. Lungs: Clear to auscultation, nonlabored breathing at rest. Cardiac: Regular rate and rhythm, no S3, 2/6 apical systolic murmur, no pericardial rub. Extremities: No pitting edema.  ECG:  An ECG dated 02/08/2021 was personally reviewed today and demonstrated:  Sinus bradycardia.  Rule out old anteroseptal infarct pattern.  Recent Labwork:  October 2022: Hemoglobin A1c 7.1%, LDL 61, potassium 4.5, hemoglobin 14.9  Other Studies Reviewed Today:  Echocardiogram 03/03/2021:  1. Left ventricular ejection fraction, by estimation, is 50 to 55%. The  left ventricle has low normal function. The left ventricle demonstrates  regional wall motion abnormalities (see scoring diagram/findings for  description). Left ventricular diastolic   parameters are indeterminate.   2. Right ventricular systolic function is normal. The right ventricular  size is normal. Tricuspid regurgitation signal is inadequate for assessing  PA pressure.   3. Left atrial size was moderately dilated.   4. Right atrial size was mildly dilated.   5. The mitral valve is grossly normal. No evidence of mitral valve  regurgitation.   6. The aortic valve is tricuspid. Aortic valve regurgitation is not  visualized.   7. The inferior vena cava is normal in size with greater than 50%  respiratory variability, suggesting right atrial pressure of 3 mmHg.   Lexiscan Myoview 03/03/2021: No diagnostic ST segment changes to suggest  ischemia with rare PVCs. Large, severe intensity defect involving the apical to basal anterior, septal, and to lesser degree inferior walls consistent with large infarct scar but no substantial degree of ischemia. This is an intermediate risk study. Nuclear stress EF: 37%.  Assessment and Plan:  1.  Preoperative cardiac evaluation in a 71 year old male with history of CAD status post remote anterior wall infarct treated with PCI to the LAD, subsequent LAD intervention related in-stent restenosis, and most recently DES to the circumflex in 2017.  He has an ischemic cardiomyopathy with improvement in LVEF to the range of 50 to 55% by testing last year, no substantial residual ischemia by Myoview in August 2022, and no progressive angina at this time on medical therapy.  He is being considered for lumbar laminectomy under general anesthesia.  RCRI perioperative cardiac risk calculator indicates overall intermediate risk.  He should be able to proceed without further testing at this point.  Recommend holding aspirin and Plavix 7 days prior to spine surgery.  2. HFrecEF with known ischemic cardiomyopathy, LVEF 50 to 55% by echocardiogram and August 2022.  He is not on beta-blocker given prior symptomatic bradycardia.  Continue Farxiga, Ismo, Altace, and Pravachol.  3.  Mixed hyperlipidemia, on Pravachol with last LDL 61.  4.  CKD stage IIIa.  Medication Adjustments/Labs and Tests Ordered: Current medicines are reviewed at length with the patient today.  Concerns regarding medicines are outlined above.   Tests Ordered: No orders of the defined types were placed in this encounter.   Medication Changes: No orders of the defined types were placed in this encounter.   Disposition:  Follow up  6 months.  Signed, Jonelle Sidle, MD, [facility] 08/29/2021 2:43 PM    [facility] at [facility] [address] [place], [place], [place] [address] Phone: [phone]; Fax: [phone]

## 2021-08-30 ENCOUNTER — Telehealth: Payer: Self-pay | Admitting: Cardiology

## 2021-08-30 NOTE — Telephone Encounter (Signed)
° °  Pre-operative Risk Assessment    Patient Name: Steven Haas  DOB: [birth year] MRN: [id number]      Request for Surgical Clearance    Procedure:   lumbar spine surgery  Date of Surgery:  Clearance 09/22/21                                 Surgeon:  Lawerance Cruel, MD Surgeon's Group or Practice Name:  [facility] Phone number:  [phone] Fax number:  [phone]   Type of Clearance Requested:   - Medical    Type of Anesthesia:  Not Indicated   Additional requests/questions:  Please fax a copy of progress note to the surgeon's office. Please fax note to [facility]  [phone]  Signed, Vallarie Mare   08/30/2021, 1:27 PM

## 2021-08-30 NOTE — Telephone Encounter (Deleted)
° ° °  Patient Name: Steven Haas  DOB: 09/30/1950 MRN: [id number]  Primary Cardiologist: Rozann Lesches, MD  Chart reviewed as part of pre-operative protocol coverage. Given past medical history and time since last visit, based on ACC/AHA guidelines, [name] would be at acceptable risk for the planned procedure without further cardiovascular testing.   The patient was advised that if he develops new symptoms prior to surgery to contact our office to arrange for a follow-up visit, and he verbalized understanding.  I will route this recommendation to the requesting party via Epic fax function and remove from pre-op pool.  Please call with questions.  Darreld Mclean, PA-C 08/30/2021, 2:48 PM

## 2021-08-30 NOTE — Telephone Encounter (Signed)
° ° °  Patient Name: Steven Haas  DOB: December 19, 1950 MRN: [id number]  Primary Cardiologist: Nona Dell, MD  Chart reviewed as part of pre-operative protocol coverage. Patient was seen by Dr. Diona Browner yesterday for pre-op evaluation at which time he was stable from a cardiac standpoint. Per Dr. Ival Bible note: "He is being considered for lumbar laminectomy under general anesthesia.  RCRI perioperative cardiac risk calculator indicates overall intermediate risk.  He should be able to proceed without further testing at this point.  Recommend holding Aspirin and Plavix 7 days prior to spine surgery."  I will route this recommendation to the requesting party via Epic fax function and remove from pre-op pool.  Please call with questions.  Corrin Parker, PA-C 08/30/2021, 2:49 PM

## 2021-09-13 ENCOUNTER — Encounter: Payer: Self-pay | Admitting: Cardiology

## 2021-09-14 NOTE — Telephone Encounter (Addendum)
Our office received notes from [facility] at 5:25 pm. Notes consisted of our pre op clearance where we have cleared the pt for upcoming procedure with requesting office. Notes also consisted of EKG's that the requesting office performed and have sent over here to cardiology for review and to see if the pt is still cleared. I s/w pre op provider yesterday Tereso Newcomer, [facility] who reviewed the EKG's and stated these notes need to be scanned into the chart for the pre op provider for reassessment for pre op clearance. I will forward this original clearance back to pre op pool. I have faxed these notes that our office received last night over to our HIM Dept Shanda B. To scan the notes into the chart. I will update the pre op provider notes are being scanned in for review.   ?

## 2021-09-14 NOTE — Telephone Encounter (Signed)
? ? ?  Patient Name: Steven Haas  ?DOB: August 07, 1950 ?MRN: [id number] ? ?Primary Cardiologist: Nona Dell, MD ? ?Chart reviewed as part of pre-operative protocol coverage. Spoke with the patient, he does not have any symptoms concerning for angina or CHF. No change in status since seen by Dr. Diona Browner. He is able to get at least 4 mets of activity. He will be cleared at acceptable risk.  ? ?The patient was advised that if he develops new symptoms prior to surgery to contact our office to arrange for a follow-up visit, and he verbalized understanding. ? ?I will route this recommendation to the requesting party via Epic fax function and remove from pre-op pool. ? ?Please call with questions. ? ?Manson Passey, PA ?09/14/2021, 4:44 PM  ?

## 2021-09-14 NOTE — Telephone Encounter (Signed)
Dr. Diona Browner, EKG done yesterday at [facility] read as new TWI in anterior leads. However on personal review, it seems new TWI in lead V2 and more pronounced in V3 & V4.  ? ?Can you take a look and given final recommendations?  ? ?Last stress test 03/03/21: ?No diagnostic ST segment changes to suggest ischemia with rare PVCs. ?Large, severe intensity defect involving the apical to basal anterior, septal, and to lesser degree inferior walls consistent with large infarct scar but no substantial degree of ischemia. ?This is an intermediate risk study. ?Nuclear stress EF: 37%. ? ? ?Please forward your response to P CV DIV PREOP.  ? ?Thank you  ?

## 2021-09-16 NOTE — Telephone Encounter (Signed)
Re-faxed the clearance over to [place] [facility]  [phone] ?

## 2021-09-16 NOTE — Telephone Encounter (Signed)
Preoperative team, please resend preoperative cardiac evaluation note from 09/14/2021.  Thank you for your help. ? ?Thomasene Ripple. Senita Corredor NP-C ? ?  ?09/16/2021, 9:00 AM ?[facility] ?[address] ?Office [phone] Fax [phone] ? ?

## 2021-09-16 NOTE — Telephone Encounter (Signed)
Steven Haas from [employer] at [facility] they have not received the clearance yet. She would like it sent again to: [phone] ? ? ?

## 2021-09-22 HISTORY — PX: OTHER SURGICAL HISTORY: SHX169

## 2021-10-12 ENCOUNTER — Encounter: Payer: Self-pay | Admitting: Cardiology

## 2021-10-12 ENCOUNTER — Encounter: Payer: Self-pay | Admitting: *Deleted

## 2021-10-12 ENCOUNTER — Ambulatory Visit (INDEPENDENT_AMBULATORY_CARE_PROVIDER_SITE_OTHER): Payer: Medicare Other | Admitting: Cardiology

## 2021-10-12 VITALS — BP 120/80 | HR 65 | Ht 76.0 in | Wt 202.6 lb

## 2021-10-12 DIAGNOSIS — I255 Ischemic cardiomyopathy: Secondary | ICD-10-CM | POA: Diagnosis not present

## 2021-10-12 DIAGNOSIS — I25119 Atherosclerotic heart disease of native coronary artery with unspecified angina pectoris: Secondary | ICD-10-CM

## 2021-10-12 MED ORDER — ISOSORBIDE MONONITRATE 20 MG PO TABS: 40.0000 mg | ORAL_TABLET | Freq: Two times a day (BID) | ORAL | 1 refills | Status: DC

## 2021-10-12 NOTE — Patient Instructions (Addendum)
Medication Instructions:  ?Your physician has recommended you make the following change in your medication:  ?Increase isosorbide mononitrate to 40 mg twice daily ?Continue other medications the same ? ?Labwork: ?none ? ?Testing/Procedures: ?none ? ?Follow-Up: ?Your physician recommends that you schedule a follow-up appointment in: 2 weeks ? ?Any Other Special Instructions Will Be Listed Below (If Applicable). ? ?If you need a refill on your cardiac medications before your next appointment, please call your pharmacy. ?

## 2021-10-12 NOTE — Progress Notes (Signed)
? ? ?Cardiology Office Note ? ?Date: 10/12/2021  ? ?ID: Steven Haas, DOB [birth year], MRN [id number] ? ?PCP:  [name], MD  ?Cardiologist:  Nona Dell, MD ?Electrophysiologist:  None  ? ?Chief Complaint  ?Patient presents with  ? Cardiac follow-up  ? ? ?History of Present Illness: ?Steven Haas is a 71 y.o. male last seen in February.  He is here for a follow-up visit. He did undergo lumbar spine surgery on March 9 through the [facility] clinic, I reviewed the recent office follow-up note.  No obvious perioperative cardiac complications indicated.  He tells me that however that he has been fatigued since surgery, some exertional angina and shortness of breath.  He has had no palpitations or syncope. ? ?We went over his medications today and discussed increasing his Ismo dosing.  He is back on aspirin and Plavix.  He did undergo ischemic testing in August of last year showing infarct scar but no definite ischemia.  I reviewed his most recent ECG.  I did talk with him about the possibility of considering a follow-up diagnostic cardiac catheterization if symptoms persist, ideally once he gets further out from his lumbar surgery. ? ?Past Medical History:  ?Diagnosis Date  ? CKD (chronic kidney disease), stage II   ? Coronary atherosclerosis of native coronary artery   ? 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 Essential hypertension   ? GERD (gastroesophageal reflux disease)   ? Ischemic cardiomyopathy   ? Sinus bradycardia   ? Metoprolol stopped 11/2011 due to symptomatic bradycardia.  ? Type 2 diabetes mellitus (HCC)   ? ? ?Past Surgical History:  ?Procedure Laterality Date  ? ELBOW SURGERY    ? Left  ? LAPAROSCOPIC CHOLECYSTECTOMY  12/23/13  ? Dr. Tiburcio Bash Cathey,MD  ? LEFT AND RIGHT HEART CATHETERIZATION WITH CORONARY ANGIOGRAM  06/09/2011  ? Procedure: LEFT AND RIGHT  HEART CATHETERIZATION WITH CORONARY ANGIOGRAM;  Surgeon: Herby Abraham, MD;  Location: [facility] CATH LAB;  Service: Cardiovascular;;  ? LEFT HEART CATHETERIZATION WITH CORONARY ANGIOGRAM N/A 11/16/2011  ? Procedure: LEFT HEART CATHETERIZATION WITH CORONARY ANGIOGRAM;  Surgeon: Laurey Morale, MD;  [facility] C[facility] LAB;  Service: Cardiovascular;  Laterality: N/A;  ? LEFT HEART CATHETERIZATION WITH CORONARY ANGIOGRAM N/A 06/24/2013  ? Procedure: LEFT HEART CATHETERIZATION WITH CORONARY ANGIOGRAM;  Surgeon: Rollene Rotunda, MD;  Location: [facility] CATH LAB;  Service: Cardiovascular;  Laterality: N/A;  ? NOSE SURGERY    ? Tumors  ? SHOULDER SURGERY    ? Right  ? ? ?Current Outpatient Medications  ?Medication Sig Dispense Refill  ? acetaminophen (TYLENOL) 500 MG tablet Take 500 mg by mouth every 6 (six) hours as needed.    ? aspirin 81 MG tablet Take 81 mg by mouth daily.    ? clopidogrel (PLAVIX) 75 MG tablet Take 75 mg by mouth daily.      ? dapagliflozin propanediol (FARXIGA) 10 MG TABS tablet Take 1 tablet (10 mg total) by mouth daily. 7 tablet 0  ? doxazosin (CARDURA) 1 MG tablet Take 1 mg by mouth daily.    ? furosemide (LASIX) 40 MG tablet TAKE 1 TABLET BY MOUTH EVERY DAY IN THE MORNING 90 tablet 1  ? isosorbide mononitrate (ISMO) 20 MG tablet Take 2 tablets (40 mg total) by mouth 2 (two) times daily. 360 tablet 1  ?  NITROSTAT 0.4 MG SL tablet PLAEC 1 TAB UNDER TONGUE EVERY 5 MINS AS NEEDED FOR CHEST PAIN**MAX 3TABS/IN 15 MIN** 25 tablet 2  ? pantoprazole (PROTONIX) 40 MG tablet TAKE 1 TABLET DAILY AT 12 NOON 90 tablet 3  ? pioglitazone (ACTOS) 30 MG tablet Take 30 mg by mouth daily.     ? pravastatin (PRAVACHOL) 80 MG tablet Take 80 mg by mouth daily.     ? ramipril (ALTACE) 10 MG capsule Take 10 mg by mouth 2 (two) times daily.     ? ?No current facility-administered medications for this visit.  ? ?Allergies:  Patient has no known allergies.  ? ?ROS: No syncope. ? ?Physical Exam: ?VS:  BP 120/80   Pulse 65   Ht 6\' 4"   (1.93 m)   Wt 202 lb 9.6 oz (91.9 kg)   SpO2 98%   BMI 24.66 kg/m? , BMI Body mass index is 24.66 kg/m?. ? ?Wt Readings from Last 3 Encounters:  ?10/12/21 202 lb 9.6 oz (91.9 kg)  ?08/29/21 204 lb 3.2 oz (92.6 kg)  ?04/04/21 211 lb 6.4 oz (95.9 kg)  ?  ?General: Patient appears comfortable at rest. ?HEENT: Conjunctiva and lids normal. ?Neck: Supple, no elevated JVP or carotid bruits, no thyromegaly. ?Lungs: Clear to auscultation, nonlabored breathing at rest. ?Cardiac: Regular rate and rhythm, no S3 or significant systolic murmur. ?Extremities: No pitting edema. ? ?ECG:  An ECG dated 09/13/2021 was personally reviewed today and demonstrated:  Sinus bradycardia with old anterior infarct pattern. ? ?Recent Labwork: ? ?February 2023: Hemoglobin 15.1, platelets 252, potassium 4.4, BUN 26, creatinine 1.4 ? ?Other Studies Reviewed Today: ? ?Echocardiogram 03/03/2021: ? 1. Left ventricular ejection fraction, by estimation, is 50 to 55%. The  ?left ventricle has low normal function. The left ventricle demonstrates  ?regional wall motion abnormalities (see scoring diagram/findings for  ?description). Left ventricular diastolic  ? parameters are indeterminate.  ? 2. Right ventricular systolic function is normal. The right ventricular  ?size is normal. Tricuspid regurgitation signal is inadequate for assessing  ?PA pressure.  ? 3. Left atrial size was moderately dilated.  ? 4. Right atrial size was mildly dilated.  ? 5. The mitral valve is grossly normal. No evidence of mitral valve  ?regurgitation.  ? 6. The aortic valve is tricuspid. Aortic valve regurgitation is not  ?visualized.  ? 7. The inferior vena cava is normal in size with greater than 50%  ?respiratory variability, suggesting right atrial pressure of 3 mmHg.  ?  ?Lexiscan Myoview 03/03/2021: ?No diagnostic ST segment changes to suggest ischemia with rare PVCs. ?Large, severe intensity defect involving the apical to basal anterior, septal, and to lesser degree  inferior walls consistent with large infarct scar but no substantial degree of ischemia. ?This is an intermediate risk study. ?Nuclear stress EF: 37%. ? ?Assessment and Plan: ? ?1.  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  He is having some exertional fatigue and angina, recent lumbar surgery in early March.  Lexiscan Myoview in August of last year was consistent with infarct scar, no definite ischemia.  LVEF 50 to 55% by concurrent echocardiogram.  Plan to increase Ismo to 40 mg twice daily and continue regimen including aspirin, Plavix, Altace, Farxiga, and Pravachol.  He is not on beta-blocker due to low resting heart rate.  We will bring him back in the next few weeks  for reevaluation, if symptoms persist, most likely need to consider a follow-up diagnostic cardiac catheterization. ? ?2. HFrecEF with ischemic cardiomyopathy and improvement in LVEF most recently 50 to 55%. ? ?Medication Adjustments/Labs and Tests Ordered: ?Current medicines are reviewed at length with the patient today.  Concerns regarding medicines are outlined above.  ? ?Tests Ordered: ?No orders of the defined types were placed in this encounter. ? ? ?Medication Changes: ?Meds ordered this encounter  ?Medications  ? isosorbide mononitrate (ISMO) 20 MG tablet  ?  Sig: Take 2 tablets (40 mg total) by mouth 2 (two) times daily.  ?  Dispense:  360 tablet  ?  Refill:  1  ?  10/12/2021 dose increase  ? ? ?Disposition:  Follow up  2 weeks. ? ?Signed, ?Jonelle SidleSamuel G. Turkessa Ostrom, MD, [facility] ?10/12/2021 2:11 PM    ?[facility] ?[address], [place], [place] [address] ?Phone: [phone]; Fax: [phone]  ?

## 2021-10-13 ENCOUNTER — Ambulatory Visit: Payer: 59 | Admitting: Cardiology

## 2021-10-18 ENCOUNTER — Ambulatory Visit: Payer: 59 | Admitting: Cardiology

## 2021-10-30 NOTE — Progress Notes (Signed)
? ? ?Cardiology Office Note ? ?Date: 11/01/2021  ? ?ID: Steven Haas, DOB 08/12/1950, MRN [id number] ? ?PCP:  Ardyth ManPark, Chan M, MD  ?Cardiologist:  Nona DellSamuel Kasten Leveque, MD ?Electrophysiologist:  None  ? ?Chief Complaint  ?Patient presents with  ? Cardiac follow-up  ? ? ?History of Present Illness: ?Steven Haas is a 71 y.o. male seen recently in late March.  He is here for a follow-up visit.  Since increasing Ismo dose at last visit, he has done much better, no recurring angina symptoms and remains stable on remainder of his cardiac regimen. ? ?We have discussed continuing observation for now unless symptoms increase, have already reviewed the possibility of a follow-up cardiac catheterization. ? ?Past Medical History:  ?Diagnosis Date  ? CKD (chronic kidney disease), stage II   ? Coronary atherosclerosis of native coronary artery   ? 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 Essential hypertension   ? GERD (gastroesophageal reflux disease)   ? Ischemic cardiomyopathy   ? Sinus bradycardia   ? Metoprolol stopped 11/2011 due to symptomatic bradycardia.  ? Type 2 diabetes mellitus (HCC)   ? ? ?Past Surgical History:  ?Procedure Laterality Date  ? ELBOW SURGERY    ? Left  ? LAPAROSCOPIC CHOLECYSTECTOMY  12/23/13  ? Dr. Tiburcio Basheginald Cathey,MD  ? LEFT AND RIGHT HEART CATHETERIZATION WITH CORONARY ANGIOGRAM  06/09/2011  ? Procedure: LEFT AND RIGHT HEART CATHETERIZATION WITH CORONARY ANGIOGRAM;  Surgeon: Herby Abrahamhomas D Stuckey, MD;  [facility];  Service: Cardiovascular;;  ? LEFT HEART CATHETERIZATION WITH CORONARY ANGIOGRAM N/A 11/16/2011  ? Procedure: LEFT HEART CATHETERIZATION WITH CORONARY ANGIOGRAM;  Surgeon: Laurey Moralealton S McLean, MD;  [facility];  Service: Cardiovascular;  Laterality: N/A;  ? LEFT HEART CATHETERIZATION WITH CORONARY ANGIOGRAM N/A 06/24/2013  ? Procedure: LEFT HEART  CATHETERIZATION WITH CORONARY ANGIOGRAM;  Surgeon: Rollene RotundaJames Hochrein, MD;  [facility];  Service: Cardiovascular;  Laterality: N/A;  ? NOSE SURGERY    ? Tumors  ? SHOULDER SURGERY    ? Right  ? ? ?Current Outpatient Medications  ?Medication Sig Dispense Refill  ? acetaminophen (TYLENOL) 500 MG tablet Take 500 mg by mouth every 6 (six) hours as needed.    ? aspirin 81 MG tablet Take 81 mg by mouth daily.    ? clopidogrel (PLAVIX) 75 MG tablet Take 75 mg by mouth daily.      ? dapagliflozin propanediol (FARXIGA) 10 MG TABS tablet Take 1 tablet (10 mg total) by mouth daily. 7 tablet 0  ? doxazosin (CARDURA) 1 MG tablet Take 1 mg by mouth daily.    ? furosemide (LASIX) 40 MG tablet TAKE 1 TABLET BY MOUTH EVERY DAY IN THE MORNING 90 tablet 1  ? isosorbide mononitrate (ISMO) 20 MG tablet Take 2 tablets (40 mg total) by mouth 2 (two) times daily. 360 tablet 1  ? NITROSTAT 0.4 MG SL tablet PLAEC 1 TAB UNDER TONGUE EVERY 5 MINS AS NEEDED FOR CHEST PAIN**MAX 3TABS/IN 15 MIN** 25 tablet 2  ? pantoprazole (PROTONIX) 40 MG tablet TAKE 1 TABLET DAILY AT 12 NOON 90 tablet 3  ? pioglitazone (ACTOS) 30 MG tablet Take 30 mg by mouth daily.     ? pravastatin (PRAVACHOL) 80 MG tablet Take 80 mg by mouth daily.     ? ramipril (ALTACE) 10 MG capsule Take  10 mg by mouth 2 (two) times daily.     ? ?No current facility-administered medications for this visit.  ? ?Allergies:  Patient has no known allergies.  ? ?ROS: No palpitations or syncope. ? ?Physical Exam: ?VS:  BP (!) 142/78   Pulse (!) 56   Ht 6\' 4"  (1.93 m)   Wt 201 lb 12.8 oz (91.5 kg)   SpO2 99%   BMI 24.56 kg/m? , BMI Body mass index is 24.56 kg/m?. ? ?Wt Readings from Last 3 Encounters:  ?11/01/21 201 lb 12.8 oz (91.5 kg)  ?10/12/21 202 lb 9.6 oz (91.9 kg)  ?08/29/21 204 lb 3.2 oz (92.6 kg)  ?  ?General: Patient appears comfortable at rest. ?HEENT: Conjunctiva and lids normal. ?Neck: Supple, no elevated JVP or carotid bruits, no thyromegaly. ?Lungs: Clear to  auscultation, nonlabored breathing at rest. ?Cardiac: Regular rate and rhythm, no S3 or significant systolic murmur, no pericardial rub. ?Extremities: No pitting edema. ? ?ECG:  An ECG dated 09/13/2021 was personally reviewed today and demonstrated:  Sinus bradycardia with old anterior infarct pattern. ? ?Recent Labwork: ? ?February 2023: Hemoglobin 15.1, platelets 252, potassium 4.4, BUN 26, creatinine 1.4 ? ?Other Studies Reviewed Today: ? ?Echocardiogram 03/03/2021: ? 1. Left ventricular ejection fraction, by estimation, is 50 to 55%. The  ?left ventricle has low normal function. The left ventricle demonstrates  ?regional wall motion abnormalities (see scoring diagram/findings for  ?description). Left ventricular diastolic  ? parameters are indeterminate.  ? 2. Right ventricular systolic function is normal. The right ventricular  ?size is normal. Tricuspid regurgitation signal is inadequate for assessing  ?PA pressure.  ? 3. Left atrial size was moderately dilated.  ? 4. Right atrial size was mildly dilated.  ? 5. The mitral valve is grossly normal. No evidence of mitral valve  ?regurgitation.  ? 6. The aortic valve is tricuspid. Aortic valve regurgitation is not  ?visualized.  ? 7. The inferior vena cava is normal in size with greater than 50%  ?respiratory variability, suggesting right atrial pressure of 3 mmHg.  ?  ?Lexiscan Myoview 03/03/2021: ?No diagnostic ST segment changes to suggest ischemia with rare PVCs. ?Large, severe intensity defect involving the apical to basal anterior, septal, and to lesser degree inferior walls consistent with large infarct scar but no substantial degree of ischemia. ?This is an intermediate risk study. ?Nuclear stress EF: 37%. ? ?Assessment and Plan: ? ?1.  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  He is clinically stable at this time with  good angina control on current regimen following increase in his mode dose.  We will continue with observation unless symptoms escalate at which point cardiac catheterization can be reconsidered.  Continue aspirin, Plavix, Altace, Ismo, Farxiga, Pravachol, and as needed nitroglycerin. ? ?2.  HFrecEF with history of ischemic cardiomyopathy, LVEF 50 to 55%. ? ?Medication Adjustments/Labs and Tests Ordered: ?Current medicines are reviewed at length with the patient today.  Concerns regarding medicines are outlined above.  ? ?Tests Ordered: ?No orders of the defined types were placed in this encounter. ? ? ?Medication Changes: ?No orders of the defined types were placed in this encounter. ? ? ?Disposition:  Follow up  6 months, sooner if needed. ? ?Signed, ?2018, MD, [facility] ?11/01/2021 9:33 AM    ?[facility] at [facility] ?[address] [place], [place], [place] ?Phone: [phone]; Fax: [phone]  ?

## 2021-11-01 ENCOUNTER — Encounter: Payer: Self-pay | Admitting: Cardiology

## 2021-11-01 ENCOUNTER — Ambulatory Visit (INDEPENDENT_AMBULATORY_CARE_PROVIDER_SITE_OTHER): Payer: Medicare Other | Admitting: Cardiology

## 2021-11-01 VITALS — BP 142/78 | HR 56 | Ht 76.0 in | Wt 201.8 lb

## 2021-11-01 DIAGNOSIS — I25119 Atherosclerotic heart disease of native coronary artery with unspecified angina pectoris: Secondary | ICD-10-CM

## 2021-11-01 DIAGNOSIS — I255 Ischemic cardiomyopathy: Secondary | ICD-10-CM

## 2021-11-01 NOTE — Patient Instructions (Addendum)

## 2021-11-24 ENCOUNTER — Telehealth: Payer: Self-pay | Admitting: Cardiology

## 2021-11-24 MED ORDER — DAPAGLIFLOZIN PROPANEDIOL 10 MG PO TABS: 10.0000 mg | ORAL_TABLET | Freq: Every day | ORAL | 3 refills | Status: DC

## 2021-11-24 NOTE — Telephone Encounter (Signed)
?*  STAT* If patient is at the pharmacy, call can be transferred to refill team. ? ? ?1. Which medications need to be refilled? (please list name of each medication and dose if known) need new prescription for Farxiga ? ?2. Which pharmacy/location (including street and city if local pharmacy) is medication to be sent to? [place] [facility] [place] [facility]   -fax to [phone] ? ?3. Do they need a 30 day or 90 day supply? 90 days and refills ?

## 2021-11-24 NOTE — Telephone Encounter (Signed)
Farixga 10 mg rx faxed to [facility] [place] ?

## 2021-12-14 ENCOUNTER — Other Ambulatory Visit: Payer: Self-pay | Admitting: Cardiology

## 2022-02-06 ENCOUNTER — Telehealth: Payer: Self-pay | Admitting: Cardiology

## 2022-02-06 NOTE — Telephone Encounter (Signed)
Pt c/o of Chest Pain: STAT if CP now or developed within 24 hours  1. Are you having CP right now? No  2. Are you experiencing any other symptoms (ex. SOB, nausea, vomiting, sweating)? Weakness.   3. How long have you been experiencing CP? For about a week.   4. Is your CP continuous or coming and going? Coming and going as far as the chest pain. The weakness is constant.  Hr is at 60 - usually runs at 42-52 range   5. Have you taken Nitroglycerin? No.  ?

## 2022-02-06 NOTE — Progress Notes (Unsigned)
Cardiology Office Note  Date: 02/07/2022   ID: Steven Haas, DOB 08/24/1950, MRN [id number]  PCP:  [name], MD  Cardiologist:  Nona Dell, MD Electrophysiologist:  None   Chief Complaint  Patient presents with   Cardiac follow-up    History of Present Illness: Steven Haas is a 71 y.o. male last seen in April.  He presents for follow-up with increasing chest discomfort on medical therapy.  Describes recurring angina symptoms which had been better controlled on Ismo previously.  Vague sense of palpitations as well but not consistently.  I reviewed his current cardiac regimen which is noted below.  He reports compliance with therapy.  Personally reviewed his ECG today which shows sinus bradycardia with old anterior infarct pattern.  We discussed the risk and benefits of a diagnostic cardiac catheterization to reassess coronary anatomy and evaluate for revascularization options.  Last intervention was DES to the ostial circumflex in November 2017 through the [facility] clinic.  Intervention history as noted below.  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   Type 2 diabetes mellitus (HCC)     Past Surgical History:  Procedure Laterality Date   ELBOW SURGERY     Left   LAPAROSCOPIC CHOLECYSTECTOMY  12/23/13   Dr. Laurel Dimmer   LEFT AND RIGHT HEART CATHETERIZATION WITH CORONARY ANGIOGRAM  06/09/2011   Procedure: LEFT AND RIGHT HEART CATHETERIZATION WITH CORONARY ANGIOGRAM;  Surgeon: Herby Abraham, MD;  Location: [facility]  CATH LAB;  Service: Cardiovascular;;   LEFT HEART CATHETERIZATION WITH CORONARY ANGIOGRAM N/A 11/16/2011   Procedure: LEFT HEART CATHETERIZATION WITH CORONARY ANGIOGRAM;  Surgeon: Laurey Morale, MD;  Location: [facility] CATH LAB;  Service: Cardiovascular;  Laterality: N/A;   LEFT HEART CATHETERIZATION WITH CORONARY ANGIOGRAM N/A 06/24/2013   Procedure: LEFT HEART CATHETERIZATION WITH CORONARY ANGIOGRAM;  Surgeon: Rollene Rotunda, MD;  Location: [facility] CATH LAB;  Service: Cardiovascular;  Laterality: N/A;   NOSE SURGERY     Tumors   SHOULDER SURGERY     Right    Current Outpatient Medications  Medication Sig Dispense Refill   acetaminophen (TYLENOL) 500 MG tablet Take 500 mg by mouth every 6 (six) hours as needed.     aspirin 81 MG tablet Take 81 mg by mouth daily.     clopidogrel (PLAVIX) 75 MG tablet Take 75 mg by mouth daily.       dapagliflozin propanediol (FARXIGA) 10 MG TABS tablet Take 1 tablet (10 mg total) by mouth daily. 90 tablet 3   doxazosin (CARDURA) 1 MG tablet Take 1 mg by mouth daily.     furosemide (LASIX) 40 MG tablet TAKE 1 TABLET BY MOUTH EVERY DAY IN THE MORNING 90 tablet 1   isosorbide mononitrate (ISMO) 20 MG tablet Take 2 tablets (40 mg total) by mouth 2 (two) times daily. 360 tablet 1   NITROSTAT 0.4 MG SL tablet PLAEC 1 TAB UNDER TONGUE EVERY 5 MINS AS NEEDED FOR CHEST PAIN**MAX 3TABS/IN 15 MIN** 25 tablet 2   pantoprazole (  PROTONIX) 40 MG tablet TAKE 1 TABLET DAILY AT 12 NOON 90 tablet 3   pioglitazone (ACTOS) 30 MG tablet Take 30 mg by mouth daily.      pravastatin (PRAVACHOL) 80 MG tablet Take 80 mg by mouth daily.      ramipril (ALTACE) 10 MG capsule Take 10 mg by mouth 2 (two) times daily.      No current facility-administered medications for this visit.   Allergies:  Patient has no known allergies.   Social History: The patient  reports that he has never smoked. He has never used smokeless tobacco. He reports that he does not drink alcohol and does not use drugs.    Family History: The patient's family history includes Heart attack in his father; Hypertension in his sister, sister, sister, and sister; Stroke in his father.   ROS: No orthopnea or PND.  Physical Exam: VS:  BP 110/60   Pulse (!) 56   Ht 6\' 4"  (1.93 m)   Wt 204 lb 6.4 oz (92.7 kg)   SpO2 95%   BMI 24.88 kg/m , BMI Body mass index is 24.88 kg/m.  Wt Readings from Last 3 Encounters:  02/07/22 204 lb 6.4 oz (92.7 kg)  11/01/21 201 lb 12.8 oz (91.5 kg)  10/12/21 202 lb 9.6 oz (91.9 kg)    General: Patient appears comfortable at rest. HEENT: Conjunctiva and lids normal, oropharynx clear. Neck: Supple, no elevated JVP or carotid bruits, no thyromegaly. Lungs: Clear to auscultation, nonlabored breathing at rest. Cardiac: Regular rate and rhythm, no S3 or significant systolic murmur, no pericardial rub. Abdomen: Soft, nontender, bowel sounds present. Extremities: No pitting edema, distal pulses 2+. Skin: Warm and dry. Musculoskeletal: No kyphosis. Neuropsychiatric: Alert and oriented x3, affect grossly appropriate.  ECG:  An ECG dated 09/13/2021 was personally reviewed today and demonstrated:  Sinus bradycardia with old anterior infarct pattern.  Recent Labwork:  February 2023: Hemoglobin 15.1, platelets 252, potassium 4.4, BUN 26, creatinine 1.4  Other Studies Reviewed Today:  Echocardiogram 03/03/2021:  1. Left ventricular ejection fraction, by estimation, is 50 to 55%. The  left ventricle has low normal function. The left ventricle demonstrates  regional wall motion abnormalities (see scoring diagram/findings for  description). Left ventricular diastolic   parameters are indeterminate.   2. Right ventricular systolic function is normal. The right ventricular  size is normal. Tricuspid regurgitation signal is inadequate for assessing  PA pressure.   3. Left atrial size was moderately dilated.   4. Right atrial size was mildly dilated.   5. The mitral valve is grossly  normal. No evidence of mitral valve  regurgitation.   6. The aortic valve is tricuspid. Aortic valve regurgitation is not  visualized.   7. The inferior vena cava is normal in size with greater than 50%  respiratory variability, suggesting right atrial pressure of 3 mmHg.    Lexiscan Myoview 03/03/2021: No diagnostic ST segment changes to suggest ischemia with rare PVCs. Large, severe intensity defect involving the apical to basal anterior, septal, and to lesser degree inferior walls consistent with large infarct scar but no substantial degree of ischemia. This is an intermediate risk study. Nuclear stress EF: 37%.  Assessment and Plan:  1.  Accelerating angina with history of CAD status post remote anterior wall infarct treated with PCI of the LAD, subsequent LAD intervention related to in-stent restenosis, and most recently DES to the ostial circumflex in 2017 through the [facility] clinic.  Currently on aspirin, Plavix, Farxiga, Ismo, Pravachol, Altace,  and as needed nitroglycerin.  Not on beta-blocker given low resting heart rate.  ECG reviewed.  Risks and benefits of diagnostic cardiac catheterization discussed and he is in agreement to proceed.  2.  HFrecEF, history of ischemic cardiomyopathy with LVEF 50 to 55% by echocardiogram in August of last year.  3.  CKD stage IIIb, creatinine 1.4 in February.  Medication Adjustments/Labs and Tests Ordered: Current medicines are reviewed at length with the patient today.  Concerns regarding medicines are outlined above.   Tests Ordered: Orders Placed This Encounter  Procedures   CBC   Basic metabolic panel   EKG 12-Lead    Medication Changes: No orders of the defined types were placed in this encounter.   Disposition:  Follow up  after testing.  Signed, Jonelle Sidle, MD, [facility] 02/07/2022 10:52 AM    [facility] at [facility] [address] [place], [place] [address] Phone: [phone]; Fax: [phone]

## 2022-02-06 NOTE — Telephone Encounter (Signed)
Reports weakness and intermittent dull chest pain for one week rated 5/10 that sometimes radiate to left arm. Denies SOB or dizziness. Denies active chest pain. Reports today HR 68 & BP 124/72. Advised to use nitroglycerin as needed, gave appointment to see Diona Browner tomorrow at 10:40 am [place] office. Advised if symptoms get worse, to go to the ED for an evaluation.

## 2022-02-06 NOTE — H&P (View-Only) (Signed)
Cardiology Office Note  Date: 02/07/2022   ID: Steven Haas, DOB 08/24/1950, MRN [id number]  PCP:  Steven Man, MD  Cardiologist:  Steven Dell, MD Electrophysiologist:  None   Chief Complaint  Patient presents with   Cardiac follow-up    History of Present Illness: Steven Haas is a 71 y.o. male last seen in April.  He presents for follow-up with increasing chest discomfort on medical therapy.  Describes recurring angina symptoms which had been better controlled on Ismo previously.  Vague sense of palpitations as well but not consistently.  I reviewed his current cardiac regimen which is noted below.  He reports compliance with therapy.  Personally reviewed his ECG today which shows sinus bradycardia with old anterior infarct pattern.  We discussed the risk and benefits of a diagnostic cardiac catheterization to reassess coronary anatomy and evaluate for revascularization options.  Last intervention was DES to the ostial circumflex in November 2017 through the [facility] clinic.  Intervention history as noted below.  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   Type 2 diabetes mellitus (HCC)     Past Surgical History:  Procedure Laterality Date   ELBOW SURGERY     Left   LAPAROSCOPIC CHOLECYSTECTOMY  12/23/13   Dr. Laurel Dimmer   LEFT AND RIGHT HEART CATHETERIZATION WITH CORONARY ANGIOGRAM  06/09/2011   Procedure: LEFT AND RIGHT HEART CATHETERIZATION WITH CORONARY ANGIOGRAM;  Surgeon: Steven Abraham, MD;  Location: [facility]  CATH LAB;  Service: Cardiovascular;;   LEFT HEART CATHETERIZATION WITH CORONARY ANGIOGRAM N/A 11/16/2011   Procedure: LEFT HEART CATHETERIZATION WITH CORONARY ANGIOGRAM;  Surgeon: Steven Morale, MD;  Location: [facility] CATH LAB;  Service: Cardiovascular;  Laterality: N/A;   LEFT HEART CATHETERIZATION WITH CORONARY ANGIOGRAM N/A 06/24/2013   Procedure: LEFT HEART CATHETERIZATION WITH CORONARY ANGIOGRAM;  Surgeon: Steven Rotunda, MD;  Location: [facility] CATH LAB;  Service: Cardiovascular;  Laterality: N/A;   NOSE SURGERY     Tumors   SHOULDER SURGERY     Right    Current Outpatient Medications  Medication Sig Dispense Refill   acetaminophen (TYLENOL) 500 MG tablet Take 500 mg by mouth every 6 (six) hours as needed.     aspirin 81 MG tablet Take 81 mg by mouth daily.     clopidogrel (PLAVIX) 75 MG tablet Take 75 mg by mouth daily.       dapagliflozin propanediol (FARXIGA) 10 MG TABS tablet Take 1 tablet (10 mg total) by mouth daily. 90 tablet 3   doxazosin (CARDURA) 1 MG tablet Take 1 mg by mouth daily.     furosemide (LASIX) 40 MG tablet TAKE 1 TABLET BY MOUTH EVERY DAY IN THE MORNING 90 tablet 1   isosorbide mononitrate (ISMO) 20 MG tablet Take 2 tablets (40 mg total) by mouth 2 (two) times daily. 360 tablet 1   NITROSTAT 0.4 MG SL tablet PLAEC 1 TAB UNDER TONGUE EVERY 5 MINS AS NEEDED FOR CHEST PAIN**MAX 3TABS/IN 15 MIN** 25 tablet 2   pantoprazole (  PROTONIX) 40 MG tablet TAKE 1 TABLET DAILY AT 12 NOON 90 tablet 3   pioglitazone (ACTOS) 30 MG tablet Take 30 mg by mouth daily.      pravastatin (PRAVACHOL) 80 MG tablet Take 80 mg by mouth daily.      ramipril (ALTACE) 10 MG capsule Take 10 mg by mouth 2 (two) times daily.      No current facility-administered medications for this visit.   Allergies:  Patient has no known allergies.   Social History: The patient  reports that he has never smoked. He has never used smokeless tobacco. He reports that he does not drink alcohol and does not use drugs.    Family History: The patient's family history includes Heart attack in his father; Hypertension in his sister, sister, sister, and sister; Stroke in his father.   ROS: No orthopnea or PND.  Physical Exam: VS:  BP 110/60   Pulse (!) 56   Ht 6' 4" (1.93 m)   Wt 204 lb 6.4 oz (92.7 kg)   SpO2 95%   BMI 24.88 kg/m , BMI Body mass index is 24.88 kg/m.  Wt Readings from Last 3 Encounters:  02/07/22 204 lb 6.4 oz (92.7 kg)  11/01/21 201 lb 12.8 oz (91.5 kg)  10/12/21 202 lb 9.6 oz (91.9 kg)    General: Patient appears comfortable at rest. HEENT: Conjunctiva and lids normal, oropharynx clear. Neck: Supple, no elevated JVP or carotid bruits, no thyromegaly. Lungs: Clear to auscultation, nonlabored breathing at rest. Cardiac: Regular rate and rhythm, no S3 or significant systolic murmur, no pericardial rub. Abdomen: Soft, nontender, bowel sounds present. Extremities: No pitting edema, distal pulses 2+. Skin: Warm and dry. Musculoskeletal: No kyphosis. Neuropsychiatric: Alert and oriented x3, affect grossly appropriate.  ECG:  An ECG dated 09/13/2021 was personally reviewed today and demonstrated:  Sinus bradycardia with old anterior infarct pattern.  Recent Labwork:  February 2023: Hemoglobin 15.1, platelets 252, potassium 4.4, BUN 26, creatinine 1.4  Other Studies Reviewed Today:  Echocardiogram 03/03/2021:  1. Left ventricular ejection fraction, by estimation, is 50 to 55%. The  left ventricle has low normal function. The left ventricle demonstrates  regional wall motion abnormalities (see scoring diagram/findings for  description). Left ventricular diastolic   parameters are indeterminate.   2. Right ventricular systolic function is normal. The right ventricular  size is normal. Tricuspid regurgitation signal is inadequate for assessing  PA pressure.   3. Left atrial size was moderately dilated.   4. Right atrial size was mildly dilated.   5. The mitral valve is grossly  normal. No evidence of mitral valve  regurgitation.   6. The aortic valve is tricuspid. Aortic valve regurgitation is not  visualized.   7. The inferior vena cava is normal in size with greater than 50%  respiratory variability, suggesting right atrial pressure of 3 mmHg.    Lexiscan Myoview 03/03/2021: No diagnostic ST segment changes to suggest ischemia with rare PVCs. Large, severe intensity defect involving the apical to basal anterior, septal, and to lesser degree inferior walls consistent with large infarct scar but no substantial degree of ischemia. This is an intermediate risk study. Nuclear stress EF: 37%.  Assessment and Plan:  1.  Accelerating angina with history of CAD status post remote anterior wall infarct treated with PCI of the LAD, subsequent LAD intervention related to in-stent restenosis, and most recently DES to the ostial circumflex in 2017 through the [facility] clinic.  Currently on aspirin, Plavix, Farxiga, Ismo, Pravachol, Altace,   and as needed nitroglycerin.  Not on beta-blocker given low resting heart rate.  ECG reviewed.  Risks and benefits of diagnostic cardiac catheterization discussed and he is in agreement to proceed.  2.  HFrecEF, history of ischemic cardiomyopathy with LVEF 50 to 55% by echocardiogram in August of last year.  3.  CKD stage IIIb, creatinine 1.4 in February.  Medication Adjustments/Labs and Tests Ordered: Current medicines are reviewed at length with the patient today.  Concerns regarding medicines are outlined above.   Tests Ordered: Orders Placed This Encounter  Procedures   CBC   Basic metabolic panel   EKG 12-Lead    Medication Changes: No orders of the defined types were placed in this encounter.   Disposition:  Follow up  after testing.  Signed, Jonelle Sidle, MD, [facility] 02/07/2022 10:52 AM    [facility] at [facility] [address] [place], [place] [address] Phone: [phone]; Fax: [phone]

## 2022-02-07 ENCOUNTER — Ambulatory Visit (INDEPENDENT_AMBULATORY_CARE_PROVIDER_SITE_OTHER): Payer: Medicare Other | Admitting: Cardiology

## 2022-02-07 ENCOUNTER — Ambulatory Visit: Payer: 59 | Admitting: Physician Assistant

## 2022-02-07 ENCOUNTER — Other Ambulatory Visit: Payer: Self-pay | Admitting: Cardiology

## 2022-02-07 ENCOUNTER — Encounter: Payer: Self-pay | Admitting: Cardiology

## 2022-02-07 ENCOUNTER — Other Ambulatory Visit (HOSPITAL_COMMUNITY)
Admission: RE | Admit: 2022-02-07 | Discharge: 2022-02-07 | Disposition: A | Discharge status: Home / Self Care | Payer: Medicare Other | Source: Ambulatory Visit | Attending: Cardiology | Admitting: Cardiology

## 2022-02-07 ENCOUNTER — Telehealth: Payer: Self-pay

## 2022-02-07 VITALS — BP 110/60 | HR 56 | Ht 76.0 in | Wt 204.4 lb

## 2022-02-07 DIAGNOSIS — I200 Unstable angina: Secondary | ICD-10-CM

## 2022-02-07 DIAGNOSIS — Z01818 Encounter for other preprocedural examination: Secondary | ICD-10-CM | POA: Insufficient documentation

## 2022-02-07 DIAGNOSIS — I25119 Atherosclerotic heart disease of native coronary artery with unspecified angina pectoris: Secondary | ICD-10-CM | POA: Insufficient documentation

## 2022-02-07 DIAGNOSIS — N1832 Chronic kidney disease, stage 3b: Secondary | ICD-10-CM

## 2022-02-07 LAB — BASIC METABOLIC PANEL
Anion gap: 6 (ref 5–15)
BUN: 28 mg/dL — ABNORMAL HIGH (ref 8–23)
CO2: 24 mmol/L (ref 22–32)
Calcium: 9.1 mg/dL (ref 8.9–10.3)
Chloride: 105 mmol/L (ref 98–111)
Creatinine, Ser: 1.66 mg/dL — ABNORMAL HIGH (ref 0.61–1.24)
GFR, Estimated: 45 mL/min — ABNORMAL LOW (ref >60)
Glucose, Bld: 200 mg/dL — ABNORMAL HIGH (ref 70–99)
Potassium: 4.4 mmol/L (ref 3.5–5.1)
Sodium: 135 mmol/L (ref 135–145)

## 2022-02-07 LAB — CBC
HCT: 43.1 % (ref 39.0–52.0)
Hemoglobin: 14.6 g/dL (ref 13.0–17.0)
MCH: 30.5 pg (ref 26.0–34.0)
MCHC: 33.9 g/dL (ref 30.0–36.0)
MCV: 90.2 fL (ref 80.0–100.0)
Platelets: 191 10*3/uL (ref 150–400)
RBC: 4.78 MIL/uL (ref 4.22–5.81)
RDW: 13.5 % (ref 11.5–15.5)
WBC: 4.7 10*3/uL (ref 4.0–10.5)
nRBC: 0 % (ref 0.0–0.2)

## 2022-02-07 MED ORDER — SODIUM CHLORIDE 0.9% FLUSH: 3.0000 mL | Freq: Two times a day (BID) | INTRAVENOUS | Status: DC

## 2022-02-07 NOTE — Telephone Encounter (Signed)
I spoke with patient, his phone number is [phone]. We discussed lab results.I spoke with Clydie Braun at [employer]. Patient will arrive at 0530 hrs on Friday, 02/10/22 for 5 hours of IV hydration before heart cath at 1030

## 2022-02-07 NOTE — Telephone Encounter (Signed)
-----   Message from Jonelle Sidle, MD sent at 02/07/2022 11:47 AM EDT ----- Results reviewed.  Creatinine 1.66, was 1.4 earlier in the year.  Potassium normal.  Please check with Cath Lab on hydration protocol.  We will need to hold Lasix for the procedure.

## 2022-02-07 NOTE — Patient Instructions (Addendum)
  [facility] [address] [place] [address] Dept: [phone] Loc: [phone]  Steven SCHOENBERGER  02/07/2022  You are scheduled for a Cardiac Catheterization on Friday, July 28 with Dr. [name].  1. Please arrive at the Main Entrance A at [facility]: [address] [place], [place] [address] at 8:30 AM (This time is two hours before your procedure to ensure your preparation). Free valet parking service is available.   Special note: Every effort is made to have your procedure done on time. Please understand that emergencies sometimes delay scheduled procedures.  2. Diet: Do not eat solid foods after midnight.  You may have clear liquids until 5 AM upon the day of the procedure.  3. Labs: You will need to have blood drawn on today at [facility] lab   4. Medication instructions in preparation for your procedure:   Contrast Allergy: No  HOLD Altace and Farxiga and Lasix the morning of cath   On the morning of your procedure, take Aspirin and any morning medicines NOT listed above.  You may use sips of water.  5. Plan to go home the same day, you will only stay overnight if medically necessary. 6. You MUST have a responsible adult to drive you home. 7. An adult MUST be with you the first 24 hours after you arrive home. 8. Bring a current list of your medications, and the last time and date medication taken. 9. Bring ID and current insurance cards. 10.Please wear clothes that are easy to get on and off and wear slip-on shoes.  Thank you for allowing [place] to care for you!   -- [facility]

## 2022-02-09 ENCOUNTER — Telehealth: Payer: Self-pay | Admitting: *Deleted

## 2022-02-09 NOTE — Telephone Encounter (Signed)
Cardiac Catheterization scheduled at [facility] for: Friday February 10, 2022 10:30 AM Arrival time and place: [facility] Main Entrance A at: 5:30 AM-pre-procedure    Nothing to eat after midnight prior to procedure, clear liquids until 5 AM day of procedure.  Medication instructions: -Hold:  Lasix-day before and day of procedure-per protocol GFR 45  Actos/Farxiga-AM of procedure -Except hold medications usual morning medications can be taken with sips of water including aspirin 81 mg and Plavix 75 mg.  Confirmed patient has responsible adult to drive home post procedure and be with patient first 24 hours after arriving home.  Patient reports no new symptoms concerning for COVID-19 in the past 10 days  Reviewed procedure instructions with patient.

## 2022-02-10 ENCOUNTER — Encounter (HOSPITAL_COMMUNITY)
Admission: RE | Disposition: A | Discharge status: Home / Self Care | Payer: 59 | Source: Home / Self Care | Attending: Cardiovascular Disease

## 2022-02-10 ENCOUNTER — Ambulatory Visit (HOSPITAL_COMMUNITY)
Admission: RE | Admit: 2022-02-10 | Discharge: 2022-02-10 | Disposition: A | Discharge status: Home / Self Care | Payer: Medicare Other | Attending: Cardiovascular Disease | Admitting: Cardiovascular Disease

## 2022-02-10 ENCOUNTER — Other Ambulatory Visit: Payer: Self-pay

## 2022-02-10 DIAGNOSIS — E1122 Type 2 diabetes mellitus with diabetic chronic kidney disease: Secondary | ICD-10-CM | POA: Insufficient documentation

## 2022-02-10 DIAGNOSIS — I200 Unstable angina: Secondary | ICD-10-CM

## 2022-02-10 DIAGNOSIS — N1832 Chronic kidney disease, stage 3b: Secondary | ICD-10-CM | POA: Insufficient documentation

## 2022-02-10 DIAGNOSIS — I25118 Atherosclerotic heart disease of native coronary artery with other forms of angina pectoris: Secondary | ICD-10-CM | POA: Diagnosis present

## 2022-02-10 DIAGNOSIS — I129 Hypertensive chronic kidney disease with stage 1 through stage 4 chronic kidney disease, or unspecified chronic kidney disease: Secondary | ICD-10-CM | POA: Diagnosis not present

## 2022-02-10 HISTORY — PX: LEFT HEART CATH AND CORONARY ANGIOGRAPHY: CATH118249

## 2022-02-10 LAB — GLUCOSE, CAPILLARY: Glucose-Capillary: 79 mg/dL (ref 70–99)

## 2022-02-10 SURGERY — LEFT HEART CATH AND CORONARY ANGIOGRAPHY
Anesthesia: LOCAL

## 2022-02-10 MED ORDER — SODIUM CHLORIDE 0.9 % IV SOLN: INTRAVENOUS | Status: AC

## 2022-02-10 MED ORDER — FENTANYL CITRATE (PF) 100 MCG/2ML IJ SOLN
INTRAMUSCULAR | Status: DC | PRN
  Administered 2022-02-10: 50 ug via INTRAVENOUS

## 2022-02-10 MED ORDER — SODIUM CHLORIDE 0.9 % WEIGHT BASED INFUSION
1.0000 mL/kg/h | INTRAVENOUS | Status: DC
  Administered 2022-02-10: 1 mL/kg/h via INTRAVENOUS

## 2022-02-10 MED ORDER — SODIUM CHLORIDE 0.9% FLUSH: 3.0000 mL | Freq: Two times a day (BID) | INTRAVENOUS | Status: DC

## 2022-02-10 MED ORDER — VERAPAMIL HCL 2.5 MG/ML IV SOLN
INTRAVENOUS | Status: AC
  Filled 2022-02-10: qty 2

## 2022-02-10 MED ORDER — HEPARIN (PORCINE) IN NACL 1000-0.9 UT/500ML-% IV SOLN
INTRAVENOUS | Status: DC | PRN
  Administered 2022-02-10 (×2): 500 mL

## 2022-02-10 MED ORDER — ACETAMINOPHEN 325 MG PO TABS
650.0000 mg | ORAL_TABLET | ORAL | Status: DC | PRN
Start: 2022-02-10 — End: 2022-02-10

## 2022-02-10 MED ORDER — ASPIRIN 81 MG PO CHEW: 81.0000 mg | CHEWABLE_TABLET | ORAL | Status: AC

## 2022-02-10 MED ORDER — HEPARIN (PORCINE) IN NACL 1000-0.9 UT/500ML-% IV SOLN
INTRAVENOUS | Status: AC
  Filled 2022-02-10: qty 1000

## 2022-02-10 MED ORDER — HEPARIN SODIUM (PORCINE) 1000 UNIT/ML IJ SOLN
INTRAMUSCULAR | Status: AC
  Filled 2022-02-10: qty 10

## 2022-02-10 MED ORDER — LIDOCAINE HCL (PF) 1 % IJ SOLN
INTRAMUSCULAR | Status: AC
  Filled 2022-02-10: qty 30

## 2022-02-10 MED ORDER — ONDANSETRON HCL 4 MG/2ML IJ SOLN: 4.0000 mg | Freq: Four times a day (QID) | INTRAMUSCULAR | Status: DC | PRN

## 2022-02-10 MED ORDER — HYDRALAZINE HCL 20 MG/ML IJ SOLN: 10.0000 mg | INTRAMUSCULAR | Status: DC | PRN

## 2022-02-10 MED ORDER — LIDOCAINE HCL (PF) 1 % IJ SOLN
INTRAMUSCULAR | Status: DC | PRN
  Administered 2022-02-10: 2 mL

## 2022-02-10 MED ORDER — SODIUM CHLORIDE 0.9% FLUSH: 3.0000 mL | INTRAVENOUS | Status: DC | PRN

## 2022-02-10 MED ORDER — MIDAZOLAM HCL 2 MG/2ML IJ SOLN
INTRAMUSCULAR | Status: AC
  Filled 2022-02-10: qty 2

## 2022-02-10 MED ORDER — IOHEXOL 350 MG/ML SOLN
INTRAVENOUS | Status: DC | PRN
  Administered 2022-02-10: 30 mL

## 2022-02-10 MED ORDER — FENTANYL CITRATE (PF) 100 MCG/2ML IJ SOLN
INTRAMUSCULAR | Status: AC
  Filled 2022-02-10: qty 2

## 2022-02-10 MED ORDER — MIDAZOLAM HCL 2 MG/2ML IJ SOLN
INTRAMUSCULAR | Status: DC | PRN
  Administered 2022-02-10: 2 mg via INTRAVENOUS

## 2022-02-10 MED ORDER — LABETALOL HCL 5 MG/ML IV SOLN: 10.0000 mg | INTRAVENOUS | Status: DC | PRN

## 2022-02-10 MED ORDER — HEPARIN SODIUM (PORCINE) 1000 UNIT/ML IJ SOLN
INTRAMUSCULAR | Status: DC | PRN
  Administered 2022-02-10: 5000 [IU] via INTRAVENOUS

## 2022-02-10 MED ORDER — SODIUM CHLORIDE 0.9 % WEIGHT BASED INFUSION
3.0000 mL/kg/h | INTRAVENOUS | Status: AC
  Administered 2022-02-10: 3 mL/kg/h via INTRAVENOUS

## 2022-02-10 MED ORDER — VERAPAMIL HCL 2.5 MG/ML IV SOLN
INTRAVENOUS | Status: DC | PRN
  Administered 2022-02-10: 10 mL via INTRA_ARTERIAL

## 2022-02-10 MED ORDER — SODIUM CHLORIDE 0.9 % IV SOLN: 250.0000 mL | INTRAVENOUS | Status: DC | PRN

## 2022-02-10 SURGICAL SUPPLY — 9 items
BAND ZEPHYR COMPRESS 30 LONG (HEMOSTASIS) ×1 IMPLANT
CATH 5FR JL3.5 JR4 ANG PIG MP (CATHETERS) ×1 IMPLANT
GLIDESHEATH SLEND SS 6F .021 (SHEATH) ×1 IMPLANT
GUIDEWIRE INQWIRE 1.5J.035X260 (WIRE) IMPLANT
INQWIRE 1.5J .035X260CM (WIRE) ×2
KIT HEART LEFT (KITS) ×2 IMPLANT
PACK CARDIAC CATHETERIZATION (CUSTOM PROCEDURE TRAY) ×2 IMPLANT
TRANSDUCER W/STOPCOCK (MISCELLANEOUS) ×2 IMPLANT
TUBING CIL FLEX 10 FLL-RA (TUBING) ×2 IMPLANT

## 2022-02-10 NOTE — Interval H&P Note (Signed)
History and Physical Interval Note:  02/10/2022 8:52 AM  Steven Haas  has presented today for surgery, with the diagnosis of angina.  The various methods of treatment have been discussed with the patient and family. After consideration of risks, benefits and other options for treatment, the patient has consented to  Procedure(s): LEFT HEART CATH AND CORONARY ANGIOGRAPHY (N/A) as a surgical intervention.  The patient's history has been reviewed, patient examined, no change in status, stable for surgery.  I have reviewed the patient's chart and labs.  Questions were answered to the patient's satisfaction.    Cath Lab Visit (complete for each Cath Lab visit)  Clinical Evaluation Leading to the Procedure:   ACS: No.  Non-ACS:    Anginal Classification: CCS III  Anti-ischemic medical therapy: Minimal Therapy (1 class of medications)  Non-Invasive Test Results: No non-invasive testing performed  Prior CABG: No previous CABG        [name]

## 2022-02-13 ENCOUNTER — Encounter (HOSPITAL_COMMUNITY): Payer: Self-pay | Admitting: Cardiovascular Disease

## 2022-02-13 ENCOUNTER — Other Ambulatory Visit: Payer: Self-pay | Admitting: Cardiology

## 2022-03-06 NOTE — Progress Notes (Unsigned)
Cardiology Office Note  Date: 03/07/2022   ID: Steven Haas, DOB [birth year], MRN [id number]  PCP:  [name], MD  Cardiologist:  Nona Dell, MD Electrophysiologist:  None   Chief Complaint  Patient presents with   Cardiac follow-up    History of Present Illness: Steven Haas is a 71 y.o. male last seen in July and referred for a follow-up cardiac catheterization with concern for increasing angina symptoms on medical therapy.  Procedure was performed by Dr. Clifton Starling on July 28 and fortunately revealed no obstructive stenoses with patent LAD and circumflex stent sites and otherwise mild to moderate disease.  Recommendation was to continue medical therapy.  He presents today for follow-up, reassured by the findings.  He remains active at baseline and we will plan to continue his current regimen.  Refill provided for fresh bottle of nitroglycerin.  Past Medical History:  Diagnosis Date   CKD (chronic kidney disease), stage II    Coronary atherosclerosis of native coronary artery    Remote MI 2004 s/p LAD PCI in [place], s/p DES to mid LAD for ISR 01/2010, s/p cutting balloon PCI to prox LAD for ISR 11/16/11, DES ostial circumflex 05/2016 [facility]   Essential hypertension    GERD (gastroesophageal reflux disease)    Ischemic cardiomyopathy    Sinus bradycardia    Metoprolol stopped 11/2011 due to symptomatic bradycardia.   Type 2 diabetes mellitus (HCC)     Past Surgical History:  Procedure Laterality Date   ELBOW SURGERY     Left   LAPAROSCOPIC CHOLECYSTECTOMY  12/23/13   Dr. Laurel Dimmer   LEFT AND RIGHT HEART CATHETERIZATION WITH CORONARY ANGIOGRAM  06/09/2011   Procedure: LEFT AND RIGHT HEART CATHETERIZATION WITH CORONARY ANGIOGRAM;  Surgeon: Herby Abraham, MD;  Location: [facility] CATH LAB;  Service: Cardiovascular;;   LEFT HEART CATH AND CORONARY ANGIOGRAPHY N/A 02/10/2022   Procedure: LEFT HEART CATH AND CORONARY ANGIOGRAPHY;  Surgeon: Kathleene Hazel, MD;  Location: [facility] INVASIVE CV LAB;  Service: Cardiovascular;  Laterality: N/A;   LEFT HEART CATHETERIZATION WITH CORONARY ANGIOGRAM N/A 11/16/2011   Procedure: LEFT HEART CATHETERIZATION WITH CORONARY ANGIOGRAM;  Surgeon: Laurey Morale, MD;  Location: [facility] CATH LAB;  Service: Cardiovascular;  Laterality: N/A;   LEFT HEART CATHETERIZATION WITH CORONARY ANGIOGRAM N/A 06/24/2013   Procedure: LEFT HEART CATHETERIZATION WITH CORONARY ANGIOGRAM;  Surgeon: Rollene Rotunda, MD;  Location: [facility] CATH LAB;  Service: Cardiovascular;  Laterality: N/A;   NOSE SURGERY     Tumors   SHOULDER SURGERY     Right    Current Outpatient Medications  Medication Sig Dispense Refill   acetaminophen (TYLENOL) 500 MG tablet Take 500 mg by mouth every 6 (six) hours as needed.     aspirin 81 MG tablet Take 81 mg by mouth daily.     clopidogrel (PLAVIX) 75 MG tablet Take 75 mg by mouth daily.       dapagliflozin propanediol (FARXIGA) 10 MG TABS tablet Take 1 tablet (10 mg total) by mouth daily. 90 tablet 3   doxazosin (CARDURA) 1 MG tablet Take 1 mg by mouth daily.     furosemide (LASIX) 40 MG tablet TAKE 1 TABLET BY MOUTH EVERY DAY IN THE MORNING 90 tablet 1   isosorbide mononitrate (ISMO) 20 MG tablet TAKE 2 TABLETS BY MOUTH 2 TIMES DAILY. 360 tablet 1   pantoprazole (PROTONIX) 40 MG tablet TAKE 1 TABLET DAILY AT 12 NOON 90 tablet 3   pioglitazone (ACTOS)  30 MG tablet Take 30 mg by mouth daily.      pravastatin (PRAVACHOL) 80 MG tablet Take 80 mg by mouth daily.      ramipril (ALTACE) 10 MG capsule Take 10 mg by mouth 2 (two) times daily.      nitroGLYCERIN (NITROSTAT) 0.4 MG SL tablet PLAEC 1 TAB UNDER TONGUE EVERY 5 MINS AS NEEDED FOR CHEST PAIN**MAX 3TABS/IN 15 MIN** 25 tablet 2   No current facility-administered medications for this visit.   Allergies:  Patient has no known allergies.   ROS: No palpitations or syncope.  Physical Exam: VS:  BP 128/62   Pulse (!) 55   Ht 6\' 4"  (1.93 m)   Wt 204 lb  9.6 oz (92.8 kg)   SpO2 100%   BMI 24.90 kg/m , BMI Body mass index is 24.9 kg/m.  Wt Readings from Last 3 Encounters:  03/07/22 204 lb 9.6 oz (92.8 kg)  02/10/22 205 lb (93 kg)  02/07/22 204 lb 6.4 oz (92.7 kg)    General: Patient appears comfortable at rest. HEENT: Conjunctiva and lids normal. Neck: Supple, no elevated JVP or carotid bruits, no thyromegaly. Lungs: Clear to auscultation, nonlabored breathing at rest. Cardiac: Regular rate and rhythm, no S3, 2/6 systolic murmur. Extremities: No pitting edema.  Right radial arteriotomy site well-healed.  ECG:  An ECG dated 02/07/2022 was personally reviewed today and demonstrated:  Sinus bradycardia with old anteroseptal infarct pattern.  Recent Labwork: 02/07/2022: BUN 28; Creatinine, Ser 1.66; Hemoglobin 14.6; Platelets 191; Potassium 4.4; Sodium 135     Component Value Date/Time   CHOL 139 06/22/2013 0045   TRIG 142 06/22/2013 0045   HDL 36 (L) 06/22/2013 0045   CHOLHDL 3.9 06/22/2013 0045   VLDL 28 06/22/2013 0045   LDLCALC 75 06/22/2013 0045    Other Studies Reviewed Today:  Cardiac catheterization 02/10/2022:   Ost Cx to Mid Cx lesion is 20% stenosed.   Mid Cx to Dist Cx lesion is 30% stenosed.   2nd LPL lesion is 30% stenosed.   Prox LAD to Mid LAD lesion is 20% stenosed.   Mid LAD to Dist LAD lesion is 50% stenosed.   Ost LAD to Prox LAD lesion is 40% stenosed.   Mild proximal LAD stenosis. Patent mid LAD stents with minimal restenosis. Diffuse moderate distal LAD stenosis.  The Circumflex is a large dominant vessel with patent ostial/proximal stented segment with minimal restenosis. Mild to moderate stenosis in the third obtuse marginal branch.  Small non-dominant RCA LVEDP 17 mmHg   Recommendations: Continue medical management of CAD.   Assessment and Plan:  1.  CAD status post remote anterior wall infarct with PCI of the LAD and subsequent intervention for treatment of in-stent restenosis, most recently DES  to the ostial circumflex in 2017 through the [facility] clinic.  Recent cardiac catheterization shows patent stent sites in the LAD and circumflex with otherwise mild to moderate residual atherosclerosis to be managed medically.  LVEF low normal range at 50 to 55%..  Continue aspirin, Plavix, Farxiga, Ismo, Altace, and Pravachol.  Refill for fresh bottle of nitroglycerin.  Not on beta-blocker given bradycardia at baseline.  2.  CKD stage IIIb, creatinine 1.66.  3.  Essential hypertension, blood pressure control is adequate today.  No changes were made.  Medication Adjustments/Labs and Tests Ordered: Current medicines are reviewed at length with the patient today.  Concerns regarding medicines are outlined above.   Tests Ordered: No orders of the defined types were placed in  this encounter.   Medication Changes: Meds ordered this encounter  Medications   nitroGLYCERIN (NITROSTAT) 0.4 MG SL tablet    Sig: PLAEC 1 TAB UNDER TONGUE EVERY 5 MINS AS NEEDED FOR CHEST PAIN**MAX 3TABS/IN 15 MIN**    Dispense:  25 tablet    Refill:  2    Disposition:  Follow up  6 months.  Signed, Jonelle Sidle, MD, [facility] 03/07/2022 8:22 AM    [facility] at [facility] [address] [place], [place], [place] [address] Phone: [phone]; Fax: [phone]

## 2022-03-07 ENCOUNTER — Encounter: Payer: Self-pay | Admitting: Cardiology

## 2022-03-07 ENCOUNTER — Ambulatory Visit (INDEPENDENT_AMBULATORY_CARE_PROVIDER_SITE_OTHER): Payer: Medicare Other | Admitting: Cardiology

## 2022-03-07 VITALS — BP 128/62 | HR 55 | Ht 76.0 in | Wt 204.6 lb

## 2022-03-07 DIAGNOSIS — I10 Essential (primary) hypertension: Secondary | ICD-10-CM

## 2022-03-07 DIAGNOSIS — N1832 Chronic kidney disease, stage 3b: Secondary | ICD-10-CM

## 2022-03-07 DIAGNOSIS — I200 Unstable angina: Secondary | ICD-10-CM | POA: Diagnosis not present

## 2022-03-07 DIAGNOSIS — I25119 Atherosclerotic heart disease of native coronary artery with unspecified angina pectoris: Secondary | ICD-10-CM | POA: Diagnosis not present

## 2022-03-07 MED ORDER — NITROGLYCERIN 0.4 MG SL SUBL: SUBLINGUAL_TABLET | SUBLINGUAL | 2 refills | Status: AC

## 2022-03-07 NOTE — Patient Instructions (Addendum)

## 2022-05-10 ENCOUNTER — Ambulatory Visit: Payer: 59 | Admitting: Cardiology

## 2022-09-13 NOTE — Progress Notes (Unsigned)
    Cardiology Office Note  Date: 09/14/2022   ID: MAKARI ROPER, DOB 01/31/1951, MRN [id number]  History of Present Illness: Steven Haas is a 72 y.o. male last seen in August 2023.  He is here for a routine visit.  Reports no increasing angina or nitroglycerin use, stable NYHA class II dyspnea, no palpitations or syncope.  He is started riding a bicycle for exercise, uses a trail in [place].  I reviewed his medications which are stable from a cardiac perspective.  He does not report any obvious intolerances.  Lab work from November 2023 showed LDL 62.  His creatinine was down to 1.35 as well.  Physical Exam: VS:  BP (!) 142/74   Pulse 69   Ht 6' 4"$  (1.93 m)   Wt 202 lb 3.2 oz (91.7 kg)   SpO2 98%   BMI 24.61 kg/m , BMI Body mass index is 24.61 kg/m.  Wt Readings from Last 3 Encounters:  09/14/22 202 lb 3.2 oz (91.7 kg)  03/07/22 204 lb 9.6 oz (92.8 kg)  02/10/22 205 lb (93 kg)    General: Patient appears comfortable at rest. HEENT: Conjunctiva and lids normal. Neck: Supple, no elevated JVP or carotid bruits. Lungs: Clear to auscultation, nonlabored breathing at rest. Cardiac: Regular rate and rhythm, no S3, 2/6 systolic murmur.  ECG:  An ECG dated 02/07/2022 was personally reviewed today and demonstrated:  Sinus bradycardia with old anteroseptal infarct pattern.  Labwork: 02/07/2022: BUN 28; Creatinine, Ser 1.66; Hemoglobin 14.6; Platelets 191; Potassium 4.4; Sodium 135  November 2023: BUN 22, creatinine 1.35, potassium 4.2, AST 18, ALT 19, hemoglobin 14.9, platelets 207, cholesterol 126, triglycerides 79, HDL 48, LDL 62, hemoglobin A1c 6.7%, TSH 0.85  Other Studies Reviewed Today:  No interval cardiac testing for review today.  Assessment and Plan:  1.  CAD status post remote anterior infarct in 2004 with angioplasty of the LAD, DES to the mid LAD for restenosis within 2011, Cutting Balloon angioplasty due to LAD in-stent restenosis in 2013, and DES to the  ostial circumflex in 2017.  Cardiac catheterization from July 2023 showed patent LAD and circumflex stent sites with mild to moderate residual disease best managed medically.  Echocardiogram from 2022 revealed LVEF 50 to 55% with wall motion abnormalities consistent with ischemic heart disease.  He is clinically stable at this point, plan to continue dual antiplatelet therapy for now.  He does not report any major spontaneous bleeding problems.  Otherwise continue Farxiga, Altace, Ismo, and Pravachol.  2.  CKD stage IIIb, most recent creatinine was down to 1.35, potassium normal.  3.  Mixed hyperlipidemia, on Pravachol with recent LDL 62.  No changes were made today.  4.  Essential hypertension.  Continue with current regimen and keep follow-up with PCP.  5.  Systolic murmur, aortic position.  No significant change.  Echocardiogram from 2022 did not demonstrate any major valvular abnormalities.  Disposition:  Follow up  6 months.  Signed, Satira Sark, M.D., F.A.C.C.

## 2022-09-14 ENCOUNTER — Ambulatory Visit: Payer: Medicare Other | Attending: Cardiology | Admitting: Cardiology

## 2022-09-14 ENCOUNTER — Encounter: Payer: Self-pay | Admitting: Cardiology

## 2022-09-14 VITALS — BP 142/74 | HR 69 | Ht 76.0 in | Wt 202.2 lb

## 2022-09-14 DIAGNOSIS — I25119 Atherosclerotic heart disease of native coronary artery with unspecified angina pectoris: Secondary | ICD-10-CM | POA: Insufficient documentation

## 2022-09-14 DIAGNOSIS — I10 Essential (primary) hypertension: Secondary | ICD-10-CM | POA: Diagnosis not present

## 2022-09-14 DIAGNOSIS — E782 Mixed hyperlipidemia: Secondary | ICD-10-CM | POA: Insufficient documentation

## 2022-09-14 DIAGNOSIS — N1832 Chronic kidney disease, stage 3b: Secondary | ICD-10-CM | POA: Insufficient documentation

## 2022-09-14 NOTE — Patient Instructions (Addendum)

## 2022-09-27 ENCOUNTER — Other Ambulatory Visit: Payer: Self-pay | Admitting: Cardiology

## 2022-10-24 ENCOUNTER — Other Ambulatory Visit: Payer: Self-pay | Admitting: Cardiology

## 2022-10-25 NOTE — Telephone Encounter (Signed)
Patient informed and agrees to switching to imdur 30 mg daily Rx sent to [facility] ([address])

## 2023-02-28 NOTE — Progress Notes (Unsigned)
    Cardiology Office Note  Date: 03/01/2023   ID: Steven Haas, DOB 12/28/1950, MRN [id number]  History of Present Illness: Steven Haas is a 72 y.o. male last seen in February.  He is here for a routine visit.  Still working full-time at [employer] home, doing well with no recurring angina or change in stamina.  NYHA class I-II dyspnea, no palpitations or syncope.  I went over his medications, he reports compliance with therapy, no nitroglycerin use.  He remains on Pravachol 80 mg daily with last LDL 62.  ECG today shows sinus bradycardia with old anteroseptal infarct pattern and occasional PVCs.  Physical Exam: VS:  BP 126/72   Pulse (!) 55   Ht 6\' 4"  (1.93 m)   Wt 204 lb 6.4 oz (92.7 kg)   SpO2 98%   BMI 24.88 kg/m , BMI Body mass index is 24.88 kg/m.  Wt Readings from Last 3 Encounters:  03/01/23 204 lb 6.4 oz (92.7 kg)  09/14/22 202 lb 3.2 oz (91.7 kg)  03/07/22 204 lb 9.6 oz (92.8 kg)    General: Patient appears comfortable at rest. HEENT: Conjunctiva and lids normal. Neck: Supple, no elevated JVP or carotid bruits. Lungs: Clear to auscultation, nonlabored breathing at rest. Cardiac: Regular rate and rhythm, no S3 or significant systolic murmur. Extremities: No pitting edema.  ECG:  An ECG dated 02/07/2022 was personally reviewed today and demonstrated:  Sinus bradycardia with old anterior infarct pattern.  Labwork:  November 2023: BUN 22, creatinine 1.35, potassium 4.2, AST 18, ALT 19, hemoglobin 14.9, platelets 207, cholesterol 126, triglycerides 79, HDL 48, LDL 62, hemoglobin A1c 6.7%, TSH 0.85  Other Studies Reviewed Today:  No interval cardiac testing for review today.  Assessment and Plan:  1.  CAD status post remote anterior infarct in 2004 with angioplasty of the LAD, DES to the mid LAD for restenosis within 2011, Cutting Balloon angioplasty due to LAD in-stent restenosis in 2013, and DES to the ostial circumflex in 2017.  Cardiac catheterization  from July 2023 showed patent LAD and circumflex stent sites with mild to moderate residual disease best managed medically.  Echocardiogram from 2022 revealed LVEF 50 to 55% with wall motion abnormalities consistent with ischemic heart disease.  He is doing well without active angina on current medical regimen.  Continue aspirin, Plavix, Farxiga, Imdur, Pravachol, and as needed nitroglycerin.  2.  CKD stage IIIb.  Creatinine 1.35 in November 2023.   3.  Mixed hyperlipidemia.  LDL 62 in November 2023.  Continue Pravachol 80 mg daily.   4.  Essential hypertension.  Blood pressure control is good today.  Also on Altace.  No changes were made.  Disposition:  Follow up  6 months.  Signed, Jonelle Sidle, M.D., F.A.C.C. [facility] at [facility]

## 2023-03-01 ENCOUNTER — Ambulatory Visit: Payer: Medicare Other | Attending: Cardiology | Admitting: Cardiology

## 2023-03-01 ENCOUNTER — Encounter: Payer: Self-pay | Admitting: Cardiology

## 2023-03-01 VITALS — BP 126/72 | HR 55 | Ht 76.0 in | Wt 204.4 lb

## 2023-03-01 DIAGNOSIS — N1832 Chronic kidney disease, stage 3b: Secondary | ICD-10-CM | POA: Diagnosis present

## 2023-03-01 DIAGNOSIS — I10 Essential (primary) hypertension: Secondary | ICD-10-CM | POA: Diagnosis present

## 2023-03-01 DIAGNOSIS — E782 Mixed hyperlipidemia: Secondary | ICD-10-CM | POA: Insufficient documentation

## 2023-03-01 DIAGNOSIS — I25119 Atherosclerotic heart disease of native coronary artery with unspecified angina pectoris: Secondary | ICD-10-CM | POA: Insufficient documentation

## 2023-03-01 NOTE — Patient Instructions (Signed)
Medication Instructions:  Your physician recommends that you continue on your current medications as directed. Please refer to the Current Medication list given to you today.   Labwork: None today  Testing/Procedures: None today  Follow-Up: 6 months  Any Other Special Instructions Will Be Listed Below (If Applicable).  If you need a refill on your cardiac medications before your next appointment, please call your pharmacy.  

## 2023-03-28 ENCOUNTER — Other Ambulatory Visit: Payer: Self-pay | Admitting: *Deleted

## 2023-03-28 MED ORDER — DAPAGLIFLOZIN PROPANEDIOL 10 MG PO TABS: 10.0000 mg | ORAL_TABLET | Freq: Every day | ORAL | 3 refills | Status: DC

## 2023-04-20 ENCOUNTER — Ambulatory Visit (INDEPENDENT_AMBULATORY_CARE_PROVIDER_SITE_OTHER): Payer: Medicare Other

## 2023-04-20 ENCOUNTER — Ambulatory Visit: Payer: Medicare Other

## 2023-04-20 ENCOUNTER — Ambulatory Visit (INDEPENDENT_AMBULATORY_CARE_PROVIDER_SITE_OTHER): Payer: Medicare Other | Admitting: Podiatry

## 2023-04-20 DIAGNOSIS — M19071 Primary osteoarthritis, right ankle and foot: Secondary | ICD-10-CM

## 2023-04-20 DIAGNOSIS — M25571 Pain in right ankle and joints of right foot: Secondary | ICD-10-CM

## 2023-04-20 NOTE — Progress Notes (Signed)
Subjective:  Patient ID: Steven Haas, male    DOB: [birth year],  MRN: [id number]  Chief Complaint  Patient presents with   Foot Problem    Sinus tarsi syndrome of right foot    72 y.o. male presents with concern for pain in the right foot.  Previously told he had sinus tarsi syndrome by an outside podiatrist in [place].  Has significant pain in the right rear foot patient says the pain is worse when he is going up or down stairs and moving his ankle.  Significant  Past Medical History:  Diagnosis Date   CKD (chronic kidney disease), stage II    Coronary atherosclerosis of native coronary artery    Remote MI 2004 s/p LAD PCI in [place], s/p DES to mid LAD for ISR 01/2010, s/p cutting balloon PCI to prox LAD for ISR 11/16/11, DES ostial circumflex 05/2016 [facility]   Essential hypertension    GERD (gastroesophageal reflux disease)    Ischemic cardiomyopathy    Sinus bradycardia    Metoprolol stopped 11/2011 due to symptomatic bradycardia.   Type 2 diabetes mellitus (HCC)     No Known Allergies  ROS: Negative except as per HPI above  Objective:  General: AAO x3, NAD  Dermatological: With inspection and palpation of the right and left lower extremities there are no open sores, no preulcerative lesions, no rash or signs of infection present. Nails are of normal length thickness and coloration.   Vascular:  Dorsalis Pedis artery and Posterior Tibial artery pedal pulses are 2/4 bilateral.  Capillary fill time < 3 sec to all digits.   Neruologic: Grossly intact via light touch bilateral. Protective threshold intact to all sites bilateral.   Musculoskeletal: Pain on palpation along the sinus tarsi and lateral rear foot just anterior and inferior to the distal fibula.  Also significant pain on palpation along the tibiotalar joint anteriorly.  Gait: Unassisted, Nonantalgic.   No images are attached to the encounter.  Radiographs:  Date: 04/20/2023 XR the right foot  Weightbearing AP/Lateral/Oblique   Findings: Significant osteoarthritic changes noted about the rear foot including the tibiotalar and subtalar joints.  No fractures identified.  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 Arthritis of right ankle   2. Arthritis of right subtalar joint   3. Sinus tarsi syndrome of right ankle      Plan:  Patient was evaluated and treated and all questions answered.  # Arthritis of ankle and subtalar joint right -Discussed with patient that I do believe he has evidence of significant osteoarthritic changes as well as possible synovitis of the right ankle -Recommend we proceed with MRI to further evaluate both the tibiotalar joint subtalar joint for synovitis as well as the cartilaginous surfaces for any osteochondral defects. -In the meantime recommend activity modification and anti-inflammatory medications as needed -Did discuss the patient may need surgical intervention to include possible rear foot arthrodesis including tibiotalar and subtalar joint fusion however we will await the results of the MRI to further discuss possible surgical treatment options pending results          Corinna Gab, DPM [facility] / [facility]

## 2023-04-30 ENCOUNTER — Ambulatory Visit (HOSPITAL_COMMUNITY)
Admission: RE | Admit: 2023-04-30 | Discharge: 2023-04-30 | Disposition: A | Discharge status: Home / Self Care | Payer: Medicare Other | Source: Ambulatory Visit | Attending: Podiatry | Admitting: Podiatry

## 2023-04-30 ENCOUNTER — Ambulatory Visit (HOSPITAL_COMMUNITY)
Admission: RE | Admit: 2023-04-30 | Discharge: 2023-04-30 | Disposition: A | Discharge status: Home / Self Care | Payer: Medicare Other | Source: Ambulatory Visit | Attending: Podiatry

## 2023-04-30 DIAGNOSIS — M25571 Pain in right ankle and joints of right foot: Secondary | ICD-10-CM | POA: Diagnosis present

## 2023-04-30 DIAGNOSIS — M19071 Primary osteoarthritis, right ankle and foot: Secondary | ICD-10-CM

## 2023-05-16 ENCOUNTER — Encounter: Payer: Self-pay | Admitting: *Deleted

## 2023-05-16 ENCOUNTER — Other Ambulatory Visit: Payer: Self-pay | Admitting: *Deleted

## 2023-05-16 MED ORDER — DAPAGLIFLOZIN PROPANEDIOL 10 MG PO TABS: 10.0000 mg | ORAL_TABLET | Freq: Every day | ORAL | 3 refills | Status: DC

## 2023-05-16 NOTE — Progress Notes (Signed)
Fax notification received from [employer] that farxiga 10 mg approved 07/18/2023 through 07/16/2024 Rx sent via EPIC to [facility]

## 2023-05-17 ENCOUNTER — Encounter: Payer: Self-pay | Admitting: Podiatry

## 2023-05-17 ENCOUNTER — Ambulatory Visit: Payer: Medicare Other | Admitting: Podiatry

## 2023-05-17 DIAGNOSIS — M19071 Primary osteoarthritis, right ankle and foot: Secondary | ICD-10-CM

## 2023-05-17 NOTE — Progress Notes (Signed)
Subjective:  Patient ID: CLARNECE DORT, male    DOB: Feb 17, 1951,  MRN: [id number]  Chief Complaint  Patient presents with   Routine Post Op    PATIENT STATES THAT HIS RF IS STILL HURTING BUT HE IS DOING OK     72 y.o. male presents for follow up on right rearfoot pain. Here to discuss MRI results. Still having a lot of pain in the right foot especially on stairs and on uneven surfaces.  Past Medical History:  Diagnosis Date   CKD (chronic kidney disease), stage II    Coronary atherosclerosis of native coronary artery    Remote MI 2004 s/p LAD PCI in [place], s/p DES to mid LAD for ISR 01/2010, s/p cutting balloon PCI to prox LAD for ISR 11/16/11, DES ostial circumflex 05/2016 [facility]   Essential hypertension    GERD (gastroesophageal reflux disease)    Ischemic cardiomyopathy    Sinus bradycardia    Metoprolol stopped 11/2011 due to symptomatic bradycardia.   Type 2 diabetes mellitus (HCC)     No Known Allergies  ROS: Negative except as per HPI above  Objective:  General: AAO x3, NAD  Dermatological: With inspection and palpation of the right and left lower extremities there are no open sores, no preulcerative lesions, no rash or signs of infection present. Nails are of normal length thickness and coloration.   Vascular:  Dorsalis Pedis artery and Posterior Tibial artery pedal pulses are 2/4 bilateral.  Capillary fill time < 3 sec to all digits.   Neruologic: Grossly intact via light touch bilateral. Protective threshold intact to all sites bilateral.   Musculoskeletal: Pain on palpation along the sinus tarsi and lateral rear foot just anterior and inferior to the distal fibula.  Also significant pain on palpation along the tibiotalar joint anteriorly.  Gait: Unassisted, Nonantalgic.   No images are attached to the encounter.  Radiographs:  Date: 04/20/2023 XR the right foot Weightbearing AP/Lateral/Oblique   Findings: Significant osteoarthritic changes noted  about the rear foot including the tibiotalar and subtalar joints.  No fractures identified.  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 Arthritis of right ankle   2. Arthritis of right subtalar joint       Plan:  Patient was evaluated and treated and all questions answered.  # Arthritis of ankle and subtalar joint right -On my read of MRI there is signficant subtalar and talonavicular joint OA. 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  -Will await final MRI read.  -Surfical plan pending final read of MRI, will discuss further with partners -In the meantime recommend activity modification and anti-inflammatory medications as needed -Pt to return in 4 weeks to discuss final MRI read and determine surgical plan          Corinna Gab, DPM [facility] / [facility]

## 2023-06-21 ENCOUNTER — Ambulatory Visit (INDEPENDENT_AMBULATORY_CARE_PROVIDER_SITE_OTHER): Payer: Medicare Other | Admitting: Podiatry

## 2023-06-21 DIAGNOSIS — M65969 Unspecified synovitis and tenosynovitis, unspecified lower leg: Secondary | ICD-10-CM

## 2023-06-21 DIAGNOSIS — M19079 Primary osteoarthritis, unspecified ankle and foot: Secondary | ICD-10-CM

## 2023-06-21 DIAGNOSIS — M19071 Primary osteoarthritis, right ankle and foot: Secondary | ICD-10-CM

## 2023-06-21 NOTE — Progress Notes (Signed)
Subjective:  Patient ID: Steven Haas, male    DOB: 09/17/1950,  MRN: [id number]    72 y.o. male presents for follow up on right rearfoot pain. Here to discuss MRI results and get a surgical plan together.  Previously did not have final MRI read available at last appointment. Still having a lot of pain in the right foot especially on stairs and on uneven surfaces.  Wants to preserve some of the range of motion in his ankle at this time but is motivated to have surgery as he has a lot of pain with his right foot.  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   Type 2 diabetes mellitus (HCC)     No Known Allergies  ROS: Negative except as per HPI above  Objective:  General: AAO x3, NAD  Dermatological: With inspection and palpation of the right and left lower extremities there are no open sores, no preulcerative lesions, no rash or signs of infection present. Nails are of normal length thickness and coloration.   Vascular:  Dorsalis Pedis artery and Posterior Tibial artery pedal pulses are 2/4 bilateral.  Capillary fill time < 3 sec to all digits.   Neruologic: Grossly intact via light touch bilateral. Protective threshold intact to all sites bilateral.   Musculoskeletal: Pain on palpation along the sinus tarsi and lateral rear foot just anterior and inferior to the distal fibula.  Also significant pain on palpation along the talonavicular joint  Gait: Unassisted, Nonantalgic.   No images are  attached to the encounter.  Radiographs:  Date: 04/20/2023 XR the right foot Weightbearing AP/Lateral/Oblique   Findings: Significant osteoarthritic changes noted about the rear foot including the tibiotalar and subtalar joints.  No fractures identified.  Osteoarthritis of the talonavicular joint is seen with sclerosis and osseous spurring noted about the joint as well as talar beaking  MRI Right foot and Ankle 10/14: 1. Advanced arthropathy of the talonavicular articulation with prominent subcortical marrow edema, confluent degenerative subcortical cysts, and irregular dorsal spurring. 2. Distal tibialis posterior tenosynovitis and tendinopathy, correlate clinically in assessing for tibialis posterior dysfunction. Mild flexor digitorum longus and flexor hallucis longus tenosynovitis at the knot of. 3. Common peroneus tendon sheath tenosynovitis also extending into the peroneus brevis and peroneus longus tendon sheaths. Distal peroneus longus tendinopathy. 4. Thickened medial band of the plantar fascia probably from plantar fasciitis. Slightly fusiform appearance of the plantar fascia raises the possibility of plantar fibromatosis although finding is subtle and there no other confirmatory findings. 5. Degenerative arthropathy of the anterior subtalar joint with subcortical marrow edema noted. 6. Suspected geode in the base of the third metatarsal extending from the intermetatarsal articulation between the third and fourth metatarsal bases. 7. Slightly thin anterior talofibular ligament although not discontinuous, query remote injury. Adjacent small effusion of the distal tibiofibular articulation. 8. Mildly thickened inferoplantar longitudinal component of the spring ligament, query sprain. Assessment:   1. Osteoarthritis of talonavicular joint   2.  Arthritis of right subtalar joint   3. Tenosynovitis of tibialis posterior tendon     Plan:  Patient was evaluated and treated and all  questions answered.  # Moderate to severe osteoarthritis of the talonavicular as well as the subtalar joint right foot # Tenosynovitis of tibialis posterior tendon right -Again discussed the MRI findings in detail with the patient.  Concern primarily for talonavicular joint arthritis as well as subtalar joint arthritis.  This corresponds to his clinical concern.  Pain significantly when on uneven surfaces and the foot moves side-to-side. -I discussed surgical intervention at this time as he has failed conservative therapies including bracing ice rest anti-inflammatory medications for extended period of time at least 3 months.\ -Surgical intervention would include talonavicular joint arthrodesis as well as subtalar joint arthrodesis on the right foot. -Also discussed bone graft harvest of bone marrow aspirate from the distal tibia.  Would recommend debridement and inspection of the posterior tibial tendon and possible graft application at today as well. -Discussed the risk benefits alternatives and possible complications including risk of infection and nonunion.  Including risk of need for further surgery.  Patient understands these risks and wishes to proceed.  Informed consent was obtained at this visit.  We discussed the postop recovery course including need for prolonged nonweightbearing approximately 4 to 6 weeks. -Will begin surgical planning at this time plan for surgery in early January 2025          Corinna Gab, DPM [facility] / [facility]

## 2023-06-30 ENCOUNTER — Other Ambulatory Visit: Payer: Self-pay | Admitting: Cardiology

## 2023-07-19 ENCOUNTER — Telehealth: Payer: Self-pay | Admitting: Cardiology

## 2023-07-19 ENCOUNTER — Telehealth: Payer: Self-pay | Admitting: *Deleted

## 2023-07-19 NOTE — Telephone Encounter (Signed)
 Pt has been scheduled tele preop appt 07/26/23. Med rec and consent are done.

## 2023-07-19 NOTE — Telephone Encounter (Signed)
   [facility] Pre-operative Risk Assessment    Request for surgical clearance:  What type of surgery is being performed?  RIGHT FOOT MEDIAL DOUBLE (SUBTALAR AND TALONAVICULAR JOINT) ARTHRODESIS, POSTERIOR TIBIAL TENDON REPAIR POSSIBLE TENDON TRANSFER, BONE MARROW ASPIRATION   When is this surgery scheduled?  08/01/23   What type of clearance is required (medical clearance vs. Pharmacy clearance to hold med vs. Both)?  Both   Are there any medications that need to be held prior to surgery and how long? Please advise on medications.  Practice name and name of physician performing surgery?  [facility]  Marsa Honour   What is your office phone number? [phone]    7.   What is your office fax number? [phone]  8.   Anesthesia type (None, local, MAC, general) ?  Choice    Steven Haas 07/19/2023, 9:20 AM

## 2023-07-19 NOTE — Telephone Encounter (Signed)
 Pt has been scheduled tele preop appt 07/26/23. Med rec and consent are done.      Patient Consent for Virtual Visit        Steven Haas has provided verbal consent on 07/19/2023 for a virtual visit (video or telephone).   CONSENT FOR VIRTUAL VISIT FOR:  Steven Haas  By participating in this virtual visit I agree to the following:  I hereby voluntarily request, consent and authorize [facility] and its employed or contracted physicians, physician assistants, nurse practitioners or other licensed health care professionals (the Practitioner), to provide me with telemedicine health care services (the "Services) as deemed necessary by the treating Practitioner. I acknowledge and consent to receive the Services by the Practitioner via telemedicine. I understand that the telemedicine visit will involve communicating with the Practitioner through live audiovisual communication technology and the disclosure of certain medical information by electronic transmission. I acknowledge that I have been given the opportunity to request an in-person assessment or other available alternative prior to the telemedicine visit and am voluntarily participating in the telemedicine visit.  I understand that I have the right to withhold or withdraw my consent to the use of telemedicine in the course of my care at any time, without affecting my right to future care or treatment, and that the Practitioner or I may terminate the telemedicine visit at any time. I understand that I have the right to inspect all information obtained and/or recorded in the course of the telemedicine visit and may receive copies of available information for a reasonable fee.  I understand that some of the potential risks of receiving the Services via telemedicine include:  Delay or interruption in medical evaluation due to technological equipment failure or disruption; Information transmitted may not be sufficient (e.g. poor  resolution of images) to allow for appropriate medical decision making by the Practitioner; and/or  In rare instances, security protocols could fail, causing a breach of personal health information.  Furthermore, I acknowledge that it is my responsibility to provide information about my medical history, conditions and care that is complete and accurate to the best of my ability. I acknowledge that Practitioner's advice, recommendations, and/or decision may be based on factors not within their control, such as incomplete or inaccurate data provided by me or distortions of diagnostic images or specimens that may result from electronic transmissions. I understand that the practice of medicine is not an exact science and that Practitioner makes no warranties or guarantees regarding treatment outcomes. I acknowledge that a copy of this consent can be made available to me via my patient portal [facility] MyChart), or I can request a printed copy by calling the office of [facility].    I understand that my insurance will be billed for this visit.   I have read or had this consent read to me. I understand the contents of this consent, which adequately explains the benefits and risks of the Services being provided via telemedicine.  I have been provided ample opportunity to ask questions regarding this consent and the Services and have had my questions answered to my satisfaction. I give my informed consent for the services to be provided through the use of telemedicine in my medical care

## 2023-07-19 NOTE — Telephone Encounter (Signed)
 Primary Cardiologist:Samuel Debera, MD   Preoperative team, please contact this patient and set up a phone call appointment for further preoperative risk assessment. Please obtain consent and complete medication review. Thank you for your help.   Per office protocol and pending no concerning cardiac symptoms at time of appointment, he may hold Plavix  for 5 days prior to procedure and should resume as soon as hemodynamically stable postoperatively. Ideally aspirin  should be continued without interruption, however if the bleeding risk is too great, aspirin  may be held for 5-7 days prior to surgery. Please resume aspirin  post operatively when it is felt to be safe from a bleeding standpoint.    I also confirmed the patient resides in the state of [place] . As per [facility] Medical Board telemedicine laws, the patient must reside in the state in which the provider is licensed.   Percy Rosaline HERO, NP  07/19/2023, 11:25 AM

## 2023-07-26 ENCOUNTER — Ambulatory Visit: Payer: Medicare Other | Attending: Cardiology | Admitting: Student

## 2023-07-26 DIAGNOSIS — Z01810 Encounter for preprocedural cardiovascular examination: Secondary | ICD-10-CM

## 2023-07-26 NOTE — Progress Notes (Signed)
 Virtual Visit via Telephone Note   Because of Steven Haas co-morbid illnesses, he is at least at moderate risk for complications without adequate follow up.  This format is felt to be most appropriate for this patient at this time.  The patient did not have access to video technology/had technical difficulties with video requiring transitioning to audio format only (telephone).  All issues noted in this document were discussed and addressed.  No physical exam could be performed with this format.  Please refer to the patient's chart for his consent to telehealth for [facility].  Evaluation Performed:  Preoperative cardiovascular risk assessment _____________   Date:  07/26/2023   Patient ID:  Steven Haas, DOB 1951-04-30, MRN [id number] Patient Location:  Home Provider location:   Office  Primary Care Provider:  Viviana Candyce HERO, MD Primary Cardiologist:  Jayson Sierras, MD  Chief Complaint / Patient Profile   73 y.o. y/o male with a h/o CAD s/p PCI with stent to LAD 2004 in the setting of STEMI, DES to mid LAD for in-stent restenosis July 2011, Cutting Balloon PCI to proximal LAD for in-stent restenosis May 2013, [facility] July 2023 showed patent stents; hypertension, hyperlipidemia, GERD, T2DM, CKD stage II who is pending right foot medial double (subtalar and talonavicular joint) arthrodesis, posterior tibial tendon repair possible tendon transfer, bone marrow aspiration by Dr. Malvin and presents today for telephonic preoperative cardiovascular risk assessment.  History of Present Illness    Steven Haas is a 73 y.o. male who presents via audio/video conferencing for a telehealth visit today.  Pt was last seen in cardiology clinic on 03/01/2023 by Dr. Sierras.  At that time Steven Haas was stable from a cardiac standpoint.  The patient is now pending procedure as outlined above. Since his last visit, he is doing well. Patient denies shortness of breath, dyspnea on  exertion, lower extremity edema, orthopnea or PND. No chest pain, pressure, or tightness. No palpitations.  His activity has been somewhat limited secondary to his ankle pain. 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   Type 2 diabetes mellitus (HCC)    Past Surgical History:  Procedure Laterality Date   ELBOW SURGERY     Left   LAPAROSCOPIC CHOLECYSTECTOMY  12/23/13   Dr. Ellaree Maranda COME   LEFT AND RIGHT HEART CATHETERIZATION WITH CORONARY ANGIOGRAM  06/09/2011   Procedure: LEFT AND RIGHT HEART CATHETERIZATION WITH CORONARY ANGIOGRAM;  Surgeon: Debby JONETTA Como, MD;  Location: [facility] CATH LAB;  Service: Cardiovascular;;   LEFT HEART CATH AND CORONARY ANGIOGRAPHY N/A 02/10/2022   Procedure: LEFT HEART CATH AND CORONARY ANGIOGRAPHY;  Surgeon: Verlin Lonni JONETTA, MD;  Location: [facility] INVASIVE CV LAB;  Service: Cardiovascular;  Laterality: N/A;   LEFT HEART CATHETERIZATION WITH CORONARY ANGIOGRAM N/A 11/16/2011   Procedure: LEFT HEART CATHETERIZATION WITH CORONARY ANGIOGRAM;  Surgeon: Ezra GORMAN Shuck, MD;  Location: [facility]  CATH LAB;  Service: Cardiovascular;  Laterality: N/A;   LEFT HEART CATHETERIZATION WITH CORONARY ANGIOGRAM N/A 06/24/2013   Procedure: LEFT HEART CATHETERIZATION WITH CORONARY ANGIOGRAM;  Surgeon: Steven Schilling, MD;  Location: [facility] CATH LAB;  Service: Cardiovascular;  Laterality: N/A;   NOSE SURGERY     Tumors   SHOULDER SURGERY     Right     Allergies  No Known Allergies  Home Medications    Prior to Admission medications   Medication Sig Start Date End Date Taking? Authorizing Provider  acetaminophen  (TYLENOL ) 500 MG tablet Take 500 mg by mouth every 6 (six) hours as needed.    [provider]  aspirin  81 MG tablet Take 81 mg by mouth daily.    [provider]  clopidogrel  (PLAVIX ) 75 MG tablet Take 75 mg by mouth daily.      [provider]  dapagliflozin  propanediol (FARXIGA ) 10 MG TABS tablet Take 1 tablet (10 mg total) by mouth daily. Rx for Year 2025 05/16/23   Debera Jayson MATSU, MD  doxazosin (CARDURA) 1 MG tablet Take 1 mg by mouth daily.    [provider]  furosemide  (LASIX ) 40 MG tablet TAKE 1 TABLET BY MOUTH EVERY DAY IN THE MORNING 12/15/21   Debera Jayson MATSU, MD  glimepiride  (AMARYL ) 2 MG tablet Take 2 mg by mouth daily. 01/01/23   [provider]  isosorbide  mononitrate (IMDUR ) 30 MG 24 hr tablet TAKE 1 TABLET BY MOUTH EVERY DAY 07/02/23   Debera Jayson MATSU, MD  nitroGLYCERIN  (NITROSTAT ) 0.4 MG SL tablet PLAEC 1 TAB UNDER TONGUE EVERY 5 MINS AS NEEDED FOR CHEST PAIN**MAX 3TABS/IN 15 MIN** 03/07/22   Debera Jayson MATSU, MD  pantoprazole  (PROTONIX ) 40 MG tablet TAKE 1 TABLET DAILY AT 12 NOON 12/27/16   Debera Jayson MATSU, MD  pioglitazone  (ACTOS ) 30 MG tablet Take 30 mg by mouth daily.     [provider]  pravastatin (PRAVACHOL) 80 MG tablet Take 80 mg by mouth daily.  05/30/16   [provider]  ramipril  (ALTACE ) 10 MG capsule Take 10 mg by mouth 2 (two) times daily.     [provider]    Physical Exam    Vital Signs:  Steven Haas does not have vital signs available for review today.  Given telephonic nature of communication, physical exam is limited. AAOx3. NAD. Normal affect.  Speech and respirations are unlabored.  Assessment & Plan    Primary Cardiologist: Jayson Debera, MD  Preoperative cardiovascular risk assessment.   Right foot medial double (subtalar and talonavicular joint) arthrodesis, posterior tibial tendon repair possible tendon transfer, bone marrow aspiration by Dr. Malvin on 08/01/2023.  Chart reviewed as part of pre-operative protocol coverage. According to the RCRI, patient has a 0.9% risk of MACE. 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   Given past medical history and time since last visit, based on ACC/AHA guidelines, Steven Haas would be at acceptable risk for the planned procedure without further cardiovascular testing.   Patient was advised that if he develops new symptoms prior to surgery to contact our office to arrange a follow-up appointment.  he verbalized understanding.  Per office protocol, he may hold Plavix  for 5 days prior to procedure and should resume as soon as hemodynamically stable postoperatively.   Ideally aspirin  should be continued without interruption, however if the bleeding risk is too great, aspirin  may be held for 5-7 days prior to  surgery. Please resume aspirin  post operatively when it is felt to be safe from a bleeding standpoint.    I will route this recommendation to the requesting party via Epic fax function.  Please call with questions.  Time:   Today, I have spent 5 minutes with the patient with telehealth technology discussing medical history, symptoms, and management plan.     Steven Hila, NP  07/26/2023, 9:57 AM

## 2023-08-01 ENCOUNTER — Other Ambulatory Visit: Payer: Self-pay | Admitting: Podiatry

## 2023-08-01 DIAGNOSIS — M65961 Unspecified synovitis and tenosynovitis, right lower leg: Secondary | ICD-10-CM | POA: Diagnosis not present

## 2023-08-01 DIAGNOSIS — M19071 Primary osteoarthritis, right ankle and foot: Secondary | ICD-10-CM | POA: Diagnosis not present

## 2023-08-01 DIAGNOSIS — M65871 Other synovitis and tenosynovitis, right ankle and foot: Secondary | ICD-10-CM | POA: Diagnosis not present

## 2023-08-01 HISTORY — PX: FOOT SURGERY: SHX648

## 2023-08-01 MED ORDER — CEPHALEXIN 500 MG PO CAPS: 500.0000 mg | ORAL_CAPSULE | Freq: Three times a day (TID) | ORAL | 0 refills | Status: AC

## 2023-08-01 MED ORDER — IBUPROFEN 800 MG PO TABS: 800.0000 mg | ORAL_TABLET | Freq: Three times a day (TID) | ORAL | 1 refills | Status: AC | PRN

## 2023-08-01 MED ORDER — OXYCODONE-ACETAMINOPHEN 5-325 MG PO TABS: 1.0000 | ORAL_TABLET | ORAL | 0 refills | Status: AC | PRN

## 2023-08-01 NOTE — Progress Notes (Signed)
 Post-op meds sent.

## 2023-08-07 ENCOUNTER — Encounter: Payer: Self-pay | Admitting: Podiatry

## 2023-08-07 ENCOUNTER — Ambulatory Visit (INDEPENDENT_AMBULATORY_CARE_PROVIDER_SITE_OTHER): Payer: Medicare Other | Admitting: Podiatry

## 2023-08-07 ENCOUNTER — Ambulatory Visit (INDEPENDENT_AMBULATORY_CARE_PROVIDER_SITE_OTHER): Payer: Medicare Other

## 2023-08-07 DIAGNOSIS — M19071 Primary osteoarthritis, right ankle and foot: Secondary | ICD-10-CM

## 2023-08-07 DIAGNOSIS — M19079 Primary osteoarthritis, unspecified ankle and foot: Secondary | ICD-10-CM

## 2023-08-07 DIAGNOSIS — Z9889 Other specified postprocedural states: Secondary | ICD-10-CM

## 2023-08-07 DIAGNOSIS — M778 Other enthesopathies, not elsewhere classified: Secondary | ICD-10-CM

## 2023-08-07 NOTE — Progress Notes (Signed)
  Subjective:  Patient ID: Steven Haas, male    DOB: 07-06-1951,  MRN: [id number]  Chief Complaint  Patient presents with   Post-op Follow-up    RM6: POV # 1 DOS 08/01/23 --- RIGHT FOOT MEDIAL DOUBLE (SUBTALAR AND TALONAVICULAR JOINT) ARTHRODESIS, POSTERIOR TIBIAL TENDON REPAIR POSSIBLE TENDON TRANSFER, BONE MARROW ASPIRATION    DOS: 08/01/2023 Procedure: Right foot medial double arthrodesis, bone marrow aspiration  73 y.o. male seen for post op check. Pt reports he is doing well. Taking ibuprofen only at this point.  He reports that the pain is overall controlled.  Has been remained nonweightbearing with aid of a knee scooter on the right lower extremity.  Review of Systems: Negative except as noted in the HPI. Denies N/V/F/Ch.   Objective:  There were no vitals filed for this visit. There is no height or weight on file to calculate BMI. Constitutional Well developed. Well nourished.  Vascular Foot warm and well perfused. Capillary refill normal to all digits.   No calf pain with palpation  Neurologic Normal speech. Oriented to person, place, and time. Epicritic sensation intact  Dermatologic Incisions medial and lateral foot as well as the heel healing well well coapted with no dehiscence or erythema drainage or other signs of infection healing well  Orthopedic: Status post medial double arthrodesis with decreased ankle range of motion limited due to pain.   Radiographs: Hardware intact with two 7.0 fully threaded screws crossing the subtalar joint and 3 staples across the talonavicular joint no evidence of hardware failure or breakage or malposition similar to immediate postop views  Pathology: N/A  Micro: N/A  Assessment:   1. Capsulitis of foot    Plan:  Patient was evaluated and treated and all questions answered.  POD # 6 s/p right foot medial double arthrodesis and injection of bone marrow aspirate concentrate along posterior tibial tendon -Progressing as  expected postoperatively.  Overall pain is controlled and no evidence of infection immediately postop at first visit.  -XR: Expected postop changes -WB Status: To right foot with aid of knee scooter.  Cam boot dispensed to the patient at this visit -Sutures: To remain intact until next appointment -Medications/ABX: Continue anticoagulation with aspirin 81 mg twice daily no further antibiotics indicated -Foot redressed.  Cam boot applied - Return in 1 week        Corinna Gab, DPM [facility] / [facility]

## 2023-08-14 ENCOUNTER — Ambulatory Visit (INDEPENDENT_AMBULATORY_CARE_PROVIDER_SITE_OTHER): Payer: Medicare Other | Admitting: Podiatry

## 2023-08-14 DIAGNOSIS — M19079 Primary osteoarthritis, unspecified ankle and foot: Secondary | ICD-10-CM

## 2023-08-14 DIAGNOSIS — M19071 Primary osteoarthritis, right ankle and foot: Secondary | ICD-10-CM

## 2023-08-14 DIAGNOSIS — Z9889 Other specified postprocedural states: Secondary | ICD-10-CM

## 2023-08-14 NOTE — Progress Notes (Signed)
  Subjective:  Patient ID: Steven Haas, male    DOB: April 21, 1951,  MRN: [id number]  Chief Complaint  Patient presents with   Routine Post Op    POV # 2 DOS 08/01/23 --- RIGHT FOOT MEDIAL DOUBLE (SUBTALAR AND TALONAVICULAR JOINT) ARTHRODESIS, POSTERIOR TIBIAL TENDON REPAIR POSSIBLE TENDON TRANSFER, BONE MARROW ASPIRATION    DOS: 08/01/2023 Procedure: Right foot medial double arthrodesis, bone marrow aspiration  73 y.o. male seen for post op check. Pt reports he is doing well. Taking ibuprofen as needed he reports that the pain is overall controlled.  Has been remained nonweightbearing with aid of a knee scooter on the right lower extremity.  Review of Systems: Negative except as noted in the HPI. Denies N/V/F/Ch.   Objective:  There were no vitals filed for this visit. There is no height or weight on file to calculate BMI. Constitutional Well developed. Well nourished.  Vascular Foot warm and well perfused. Capillary refill normal to all digits.   No calf pain with palpation  Neurologic Normal speech. Oriented to person, place, and time. Epicritic sensation intact  Dermatologic Incisions medial and lateral foot as well as the heel healing well well coapted with no dehiscence or erythema drainage or other signs of infection healing well  Orthopedic: Status post medial double arthrodesis with decreased ankle range of motion limited due to pain.   Radiographs: Deferred at this visit  Pathology: N/A  Micro: N/A  Assessment:   1. Post-operative state   2. Osteoarthritis of talonavicular joint   3. Arthritis of right subtalar joint     Plan:  Patient was evaluated and treated and all questions answered.  2 weeks postop s/p right foot medial double arthrodesis and injection of bone marrow aspirate concentrate along posterior tibial tendon -Progressing as expected postoperatively.   Doing very well pain is controlled incisions healing well. -XR: Deferred today will take new  x-rays at next appointment -WB Status: To right foot with aid of knee scooter.    -Sutures: Removed in total at this visit and Steri-Strips applied -Medications/ABX: Continue anticoagulation with aspirin 81 mg twice daily no further antibiotics indicated -Foot redressed with gauze roll and Ace wrap.  Okay to get foot wet and wash in the shower with warm soapy water then dry off and apply gauze roll and Ace wrap as needed.  Okay to use vitamin E lotion starting after 5 more days -Follow-up in 4 weeks        Corinna Gab, DPM [facility] / [facility]

## 2023-09-07 ENCOUNTER — Ambulatory Visit: Payer: Medicare Other | Attending: Cardiology | Admitting: Cardiology

## 2023-09-07 ENCOUNTER — Encounter: Payer: Self-pay | Admitting: Cardiology

## 2023-09-07 VITALS — BP 132/70 | HR 64 | Ht 76.0 in | Wt 205.0 lb

## 2023-09-07 DIAGNOSIS — I25119 Atherosclerotic heart disease of native coronary artery with unspecified angina pectoris: Secondary | ICD-10-CM

## 2023-09-07 DIAGNOSIS — E782 Mixed hyperlipidemia: Secondary | ICD-10-CM | POA: Diagnosis present

## 2023-09-07 DIAGNOSIS — N1831 Chronic kidney disease, stage 3a: Secondary | ICD-10-CM

## 2023-09-07 DIAGNOSIS — I10 Essential (primary) hypertension: Secondary | ICD-10-CM

## 2023-09-07 MED ORDER — FUROSEMIDE 40 MG PO TABS: 40.0000 mg | ORAL_TABLET | Freq: Every day | ORAL | Status: AC | PRN

## 2023-09-07 NOTE — Patient Instructions (Signed)
 Medication Instructions:  Your physician has recommended you make the following change in your medication:  Change furosemide 40 mg to daily as needed Continue all other medications as prescribed  Labwork: none  Testing/Procedures: none  Follow-Up: Your physician recommends that you schedule a follow-up appointment in: 6 months  Any Other Special Instructions Will Be Listed Below (If Applicable).  If you need a refill on your cardiac medications before your next appointment, please call your pharmacy.

## 2023-09-07 NOTE — Progress Notes (Signed)
    Cardiology Office Note  Date: 09/07/2023   ID: Steven Haas, DOB [birth year], MRN [id number]  History of Present Illness: Steven Haas is a 73 y.o. male last seen in August 2024.  He is here for a routine visit.  Reports no angina or interval nitroglycerin use.  He is currently less active and out of work having undergone right foot surgery, still recovering.  He does plan to return to prior responsibilities.  We went over his medications today.  He reports no intolerances.  Has Lasix available for leg swelling, but not using it at all at this point.  Physical Exam: VS:  BP 132/70   Pulse 64   Ht 6\' 4"  (1.93 m)   Wt 205 lb (93 kg)   BMI 24.95 kg/m , BMI Body mass index is 24.95 kg/m.  Wt Readings from Last 3 Encounters:  09/07/23 205 lb (93 kg)  03/01/23 204 lb 6.4 oz (92.7 kg)  09/14/22 202 lb 3.2 oz (91.7 kg)    General: Patient appears comfortable at rest. HEENT: Conjunctiva and lids normal. Neck: Supple, no elevated JVP or carotid bruits. Lungs: Clear to auscultation, nonlabored breathing at rest. Cardiac: Regular rate and rhythm, no S3 or significant systolic murmur. Extremities: Right foot in protective boot.  ECG:  An ECG dated 03/01/2023 was personally reviewed today and demonstrated:  Sinus bradycardia with old anterior septal infarct pattern and occasional PVCs.  Labwork:  November 2023: BUN 22, creatinine 1.35, potassium 4.2, AST 18, ALT 19, hemoglobin 14.9, platelets 207, cholesterol 126, triglycerides 79, HDL 48, LDL 62, hemoglobin A1c 6.7%, TSH 0.85   Other Studies Reviewed Today:  No interval cardiac testing for review today.  Assessment and Plan:  1.  CAD status post remote anterior infarct in 2004 with angioplasty of the LAD, DES to the mid LAD for restenosis within 2011, Cutting Balloon angioplasty due to LAD in-stent restenosis in 2013, and DES to the ostial circumflex in 2017.  Cardiac catheterization from July 2023 showed patent LAD and  circumflex stent sites with mild to moderate residual disease best managed medically.  Echocardiogram from 2022 revealed LVEF 50 to 55% with wall motion abnormalities consistent with ischemic heart disease.  He is doing well without angina or interval nitroglycerin use.  Plan to continue aspirin 81 mg daily, Plavix 75 mg daily, Farxiga 10 mg daily, Imdur 30 mg daily, Pravachol 80 mg daily, and as needed nitroglycerin.   2.  CKD stage IIIa.  Creatinine 1.35 with GFR 57 in November 2023.   3.  Mixed hyperlipidemia.  LDL 62 in November 2023.  Continue Pravachol 80 mg daily.   4.  Primary hypertension.  Blood pressure control is reasonable today.  Plan to continue Altace 10 mg daily.  Disposition:  Follow up  6 months.  Signed, Jonelle Sidle, M.D., F.A.C.C. [facility]

## 2023-09-11 ENCOUNTER — Ambulatory Visit (INDEPENDENT_AMBULATORY_CARE_PROVIDER_SITE_OTHER): Payer: Medicare Other | Admitting: Podiatry

## 2023-09-11 ENCOUNTER — Ambulatory Visit (INDEPENDENT_AMBULATORY_CARE_PROVIDER_SITE_OTHER): Payer: Medicare Other

## 2023-09-11 ENCOUNTER — Encounter: Payer: Self-pay | Admitting: Podiatry

## 2023-09-11 DIAGNOSIS — Z9889 Other specified postprocedural states: Secondary | ICD-10-CM

## 2023-09-11 NOTE — Progress Notes (Signed)
  Subjective:  Patient ID: Steven Haas, male    DOB: Apr 08, 1951,  MRN: [id number]  Chief Complaint  Patient presents with   Routine Post Op    Post op 3 DOS 08/01/23 --- RIGHT FOOT MEDIAL DOUBLE (SUBTALAR AND TALONAVICULAR JOINT) ARTHRODESIS, POSTERIOR TIBIAL TENDON REPAIR POSSIBLE TENDON TRANSFER, BONE MARROW ASPIRATION     DOS: 08/01/2023 Procedure: Right foot medial double arthrodesis, bone marrow aspiration  73 y.o. male seen for post op check. Pt reports he is doing well.  Patient reports he is improved from prior wound has nearly healed there is a small scab that sometimes falls off and he believes little bit underlying and then reforms a scab on the top of his right foot.  Pain is controlled he does have some stiffness in his ankle but otherwise no complaints has been nonweightbearing with a knee scooter.  Review of Systems: Negative except as noted in the HPI. Denies N/V/F/Ch.   Objective:  There were no vitals filed for this visit. There is no height or weight on file to calculate BMI. Constitutional Well developed. Well nourished.  Vascular Foot warm and well perfused. Capillary refill normal to all digits.   No calf pain with palpation  Neurologic Normal speech. Oriented to person, place, and time. Epicritic sensation intact  Dermatologic Incisions medial and lateral foot part nearly fully healed laterally is healed and the dorsal medial foot has a small eschar      Orthopedic: Status post medial double arthrodesis.  Ankle range of motion is improved with some stiffness at the tibiotalar joint.  No acute pain on palpation   Radiographs: 09/11/2023 XR 3 views AP lateral oblique of the right foot nonweightbearing.  Findings: Intact hardware including 3 staples and 2 fully threaded screws in the right foot.  Appears to be good osseous and fell occurring across the subtalar joint with good compression noted at the joint.  Additionally the talonavicular joint does appear to  have good compression some osseous infill as well.  Question of one of the staples legs is in the prior joint however difficult to tell on lateral view given the curvature of the navicular.  Will continue to monitor however although consistent with what I would expect for 6 weeks postoperative.  Pathology: N/A  Micro: N/A  Assessment:   1. Post-operative state      Plan:  Patient was evaluated and treated and all questions answered.  6 weeks postop s/p right foot medial double arthrodesis and injection of bone marrow aspirate concentrate along posterior tibial tendon -Progressing as expected postoperatively.   Doing very well pain is controlled incisions healing well.  Appears to have good osseous healing occurring at the surgical site.   -XR: As above above expected postoperative changes -WB Status: Now okay to begin weightbearing in cam boot with aid of walker.  Wear a cam boot for another 4 weeks and then try to get out of cam boot on that time before he comes back to see me -Sutures: Previously removed -Medications/ABX: Okay to discontinue aspirin 81 mg twice daily no antibiotics indicated -No further dressings required -Follow-up in 6 weeks        Corinna Gab, DPM [facility] / [facility]

## 2023-10-05 ENCOUNTER — Other Ambulatory Visit: Payer: Self-pay | Admitting: *Deleted

## 2023-10-05 MED ORDER — DAPAGLIFLOZIN PROPANEDIOL 10 MG PO TABS: 10.0000 mg | ORAL_TABLET | Freq: Every day | ORAL | 3 refills | Status: AC

## 2023-10-23 ENCOUNTER — Ambulatory Visit (INDEPENDENT_AMBULATORY_CARE_PROVIDER_SITE_OTHER): Payer: Medicare Other | Admitting: Podiatry

## 2023-10-23 ENCOUNTER — Encounter: Payer: Self-pay | Admitting: Podiatry

## 2023-10-23 ENCOUNTER — Ambulatory Visit (INDEPENDENT_AMBULATORY_CARE_PROVIDER_SITE_OTHER)

## 2023-10-23 VITALS — Ht 76.0 in | Wt 205.0 lb

## 2023-10-23 DIAGNOSIS — M19079 Primary osteoarthritis, unspecified ankle and foot: Secondary | ICD-10-CM

## 2023-10-23 DIAGNOSIS — M19071 Primary osteoarthritis, right ankle and foot: Secondary | ICD-10-CM

## 2023-10-23 DIAGNOSIS — Z9889 Other specified postprocedural states: Secondary | ICD-10-CM

## 2023-10-23 NOTE — Progress Notes (Signed)
  Subjective:  Patient ID: Steven Haas, male    DOB: Apr 04, 1951,  MRN: [id number]  Chief Complaint  Patient presents with   Routine Post Op    POV # 4 DOS 08/01/23 --- RIGHT FOOT MEDIAL DOUBLE (SUBTALAR AND TALONAVICULAR JOINT) ARTHRODESIS, POSTERIOR TIBIAL TENDON REPAIR POSSIBLE TENDON TRANSFER, BONE MARROW ASPIRATION, Pt states everything is ok still some pain and swelling.    DOS: 08/01/2023 Procedure: Right foot medial double arthrodesis, bone marrow aspiration  73 y.o. male seen for post op check.  Patient reports he is doing well he is improved from prior he has been wearing regular shoes for about 2 weeks.  He does report some pain and stiffness when he gets up and starts moving but it decreases somewhat during the day.  He does notice that ankle dorsiflexion and plantarflexion range of motion are without pain but he does have some pain with side-to-side motion of the ankle.  He is still walking with a slight limp  Review of Systems: Negative except as noted in the HPI. Denies N/V/F/Ch.   Objective:  There were no vitals filed for this visit. Body mass index is 24.95 kg/m. Constitutional Well developed. Well nourished.  Vascular Foot warm and well perfused. Capillary refill normal to all digits.   No calf pain with palpation  Neurologic Normal speech. Oriented to person, place, and time. Epicritic sensation intact  Dermatologic Incisions medial and lateral foot fully healed minimal scar  Orthopedic: Status post medial double arthrodesis.  Edema noted about the TNJ. Pain on palpation of TNJ and STJ laterally.    Radiographs: 10/23/23 XR 3 views WB R foot: Surgical hardware intact including 2 subtalar joint screws.  There appears to be good osseous infill across the subtalar joint.  There is noted to be some fragmentation of the navicular medially however the remainder of the joint appears to be healing well with good osseous infill across the arthrodesis site.  His 3 surgical  staples are intact.   Pathology: N/A  Micro: N/A  Assessment:   1. Post-operative state   2. Osteoarthritis of talonavicular joint   3. Arthritis of right subtalar joint       Plan:  Patient was evaluated and treated and all questions answered.  12 weeks postop s/p right foot medial double arthrodesis and injection of bone marrow aspirate concentrate along posterior tibial tendon - Overall continuing to progress does have some residual pain and edema however he is continuing to heal.  Concern for some possible fragmentation of the navicular medially and pain with insertion of the posterior tibial tendon. -Recommend ankle support brace soft brace or compression sock for edema and pain relief.  He says that wearing lace up boots feels better for him than low-cut shoe. -Continue regular walking and range of motion exercises, questionable medial fragmentation of the navicular -XR: As above above expected postoperative changes -WB Status: Continued WBAT in regular shoes -Sutures: Previously removed -Medications/ABX: Ibuprofen as needed for pain -Follow-up in 2 mo for recheck and rex-ray of the right foot if pain is worsening or not improving we will have to consider revision of the talonavicular joint and possible resection of osseous fragmentation and PT tendon transfer        Corinna Gab, DPM [facility] / [facility]

## 2023-11-13 ENCOUNTER — Telehealth: Payer: Self-pay

## 2023-11-13 NOTE — Telephone Encounter (Signed)
 Patient called this morning and left a message. His right ankle is swelling, and it is moving up his leg towards his knee. His next appointment with you  is June 10. Does he need to be concerned / come in to be seen sooner?

## 2023-12-25 ENCOUNTER — Ambulatory Visit (INDEPENDENT_AMBULATORY_CARE_PROVIDER_SITE_OTHER): Admitting: Podiatry

## 2023-12-25 ENCOUNTER — Ambulatory Visit (INDEPENDENT_AMBULATORY_CARE_PROVIDER_SITE_OTHER)

## 2023-12-25 DIAGNOSIS — M19079 Primary osteoarthritis, unspecified ankle and foot: Secondary | ICD-10-CM | POA: Diagnosis not present

## 2023-12-25 DIAGNOSIS — G8928 Other chronic postprocedural pain: Secondary | ICD-10-CM

## 2023-12-25 DIAGNOSIS — Z9889 Other specified postprocedural states: Secondary | ICD-10-CM

## 2023-12-25 DIAGNOSIS — M19071 Primary osteoarthritis, right ankle and foot: Secondary | ICD-10-CM | POA: Diagnosis not present

## 2023-12-25 MED ORDER — OXYCODONE-ACETAMINOPHEN 5-325 MG PO TABS: 1.0000 | ORAL_TABLET | ORAL | 0 refills | Status: AC | PRN

## 2023-12-25 NOTE — Progress Notes (Signed)
 Subjective:  Patient ID: Steven Haas, male    DOB: August 03, 1950,  MRN: [id number]  Chief Complaint  Patient presents with   Routine Post Op    Right foot post operative F/U patient states he is in a lot of pain dorsal foot and swelling    DOS: 08/01/2023 Procedure: Right foot medial double arthrodesis, bone marrow aspiration  73 y.o. male seen for post op check.  Patient having significant pain with ambulation at this point in time.  He says the pain is primarily on the front of his ankle and on the outside of the ankle.  He does not have as much pain if any on the inside of the foot.  He states pain is worsened with going up and down stairs.  He is not able to walk barefoot due to the pain has to have a shoe on even when showering.  Taking ibuprofen  for pain but not helping relieving pain adequately  Review of Systems: Negative except as noted in the HPI. Denies N/V/F/Ch.   Objective:  There were no vitals filed for this visit. There is no height or weight on file to calculate BMI. Constitutional Well developed. Well nourished.  Vascular Foot warm and well perfused. Capillary refill normal to all digits.   No calf pain with palpation  Neurologic Normal speech. Oriented to person, place, and time. Epicritic sensation intact  Dermatologic Incisions medial and lateral foot fully healed minimal scar  Orthopedic: Status post medial double arthrodesis.  Edema noted about the TNJ. Pain primarily on palpation of the anterior aspect of the ankle and subtalar joint especially near the lateral aspect of the TMJ and the CC J.  Significant pain over the sinus tarsi area as well.  No pain medially with palpation of the navicular tuberosity.   Radiographs: 12/25/23 XR 3 views WB R foot: Surgical hardware intact including 2 subtalar joint screws.  There appears to be good osseous infill across the subtalar joint similar to prior.  There is noted to be some fragmentation of the navicular medially  however still seems to have improved osseous infill across the majority of the joint indicating good osseous fusion.  His 3 surgical staples are intact.  Questionable prominence especially of the lateral staple anteriorly at the ankle   Pathology: N/A  Micro: N/A  Assessment:   1. Chronic post-operative pain   2. Osteoarthritis of talonavicular joint   3. Arthritis of right subtalar joint   4. Post-operative state   Status post right foot medial double arthrodesis with bone marrow aspirate injection  Plan:  Patient was evaluated and treated and all questions answered.  20 weeks postop s/p right foot medial double arthrodesis and injection of bone marrow aspirate concentrate along posterior tibial tendon - Patient has regressed with worse postoperative pain at this time.  Primary concern would be for nonunion versus hardware pain. -CT scan without contrast of the right foot and ankle ordered to evaluate for osseous healing at the TMJ and the STJ. -Discussed that patient may need revision surgery which could include either hardware removal versus revision arthrodesis as indicated by the CT scan -I also recommend a steroid injection for pain relief at this time.  Patient was agreeable after sterile prep injected 1 cc half percent Marcaine plain with 1 cc Kenalog 10 about the anterior lateral aspect of the ankle joint. -Recommend ankle support brace soft brace or compression sock for edema and pain relief.  He says that wearing lace up boots  feels better for him than low-cut shoe. -Continue regular walking and range of motion exercises, questionable medial fragmentation of the navicular -XR: As above above expected postoperative changes -WB Status: Continued WBAT in regular shoes -Sutures: Previously removed -Medications/ABX: E Rx for Percocet 5 mg x 30 dispensed for severe pain at night -Follow-up in 2 weeks for discussion of CT results.        Maridee Shoemaker, DPM [facility] / [facility]

## 2023-12-28 ENCOUNTER — Telehealth: Payer: Self-pay

## 2023-12-28 NOTE — Telephone Encounter (Signed)
 [facility] called - the tech says that you cannot do CT of foot and CT of ankle, that it has to be one or the other -I explained that both were ordered for a reason. I think they can include ankle on the foot CT -is this ok? [facility] [phone] opt 1 then opt 4

## 2024-01-02 ENCOUNTER — Ambulatory Visit
Admission: RE | Admit: 2024-01-02 | Discharge: 2024-01-02 | Disposition: A | Discharge status: Home / Self Care | Source: Ambulatory Visit | Attending: Podiatry | Admitting: Podiatry

## 2024-01-02 DIAGNOSIS — G8928 Other chronic postprocedural pain: Secondary | ICD-10-CM

## 2024-01-07 ENCOUNTER — Telehealth: Payer: Self-pay | Admitting: Podiatry

## 2024-01-07 NOTE — Telephone Encounter (Signed)
 Patient stated he was asked by [facility] to call and check to see if results came in for CAT SCAN before coming to scheduled appointment on 01/08/24

## 2024-01-08 ENCOUNTER — Ambulatory Visit (INDEPENDENT_AMBULATORY_CARE_PROVIDER_SITE_OTHER): Admitting: Podiatry

## 2024-01-08 DIAGNOSIS — G8928 Other chronic postprocedural pain: Secondary | ICD-10-CM

## 2024-01-08 DIAGNOSIS — M19079 Primary osteoarthritis, unspecified ankle and foot: Secondary | ICD-10-CM

## 2024-01-08 NOTE — Progress Notes (Signed)
 Patient seen and discussed we do not have the CT reports back yet.  Will have him follow-up with a phone call when I get the results I will call him to discuss results and possible need for hardware removal versus revision surgery.  He was in agreement.

## 2024-01-16 ENCOUNTER — Telehealth: Payer: Self-pay | Admitting: Podiatry

## 2024-01-16 NOTE — Telephone Encounter (Signed)
 Patient wants to know results of cat scan and what are the next steps he should take.

## 2024-01-25 ENCOUNTER — Encounter: Payer: Self-pay | Admitting: Cardiology

## 2024-01-25 ENCOUNTER — Ambulatory Visit: Attending: Cardiology | Admitting: Cardiology

## 2024-01-25 VITALS — BP 126/72 | HR 66 | Ht 76.0 in | Wt 193.0 lb

## 2024-01-25 DIAGNOSIS — E782 Mixed hyperlipidemia: Secondary | ICD-10-CM | POA: Insufficient documentation

## 2024-01-25 DIAGNOSIS — I25119 Atherosclerotic heart disease of native coronary artery with unspecified angina pectoris: Secondary | ICD-10-CM | POA: Diagnosis present

## 2024-01-25 DIAGNOSIS — I10 Essential (primary) hypertension: Secondary | ICD-10-CM | POA: Diagnosis present

## 2024-01-25 NOTE — Progress Notes (Signed)
    Cardiology Office Note  Date: 01/25/2024   ID: JERREMY MAIONE, DOB 1951/05/19, MRN [id number]  History of Present Illness: Steven Haas is a 73 y.o. male last seen in February.  He is here for a follow-up visit with plan for repeat right foot surgery under general anesthesia with Dr. Malvin.  He had no cardiology complications with his initial surgery in January.  He does not report any angina or interval nitroglycerin  use, stable NYHA class II dyspnea.  We went over his medications.  He states that he has been compliant with current cardiac regimen.  He remains on aspirin  and Plavix , no spontaneous bleeding problems.  Continues to follow with Dr. Viviana for primary care.  I reviewed his ECG today which shows sinus rhythm with old anterolateral infarct pattern, repolarization abnormalities and PVC.  Physical Exam: VS:  BP 126/72 (BP Location: Right Arm, Cuff Size: Normal)   Pulse 66   Ht 6' 4 (1.93 m)   Wt 193 lb (87.5 kg)   SpO2 97%   BMI 23.49 kg/m , BMI Body mass index is 23.49 kg/m.  Wt Readings from Last 3 Encounters:  01/25/24 193 lb (87.5 kg)  10/23/23 205 lb (93 kg)  09/07/23 205 lb (93 kg)    General: Patient appears comfortable at rest. HEENT: Conjunctiva and lids normal. Neck: Supple, no elevated JVP or carotid bruits. Lungs: Clear to auscultation, nonlabored breathing at rest. Cardiac: Regular rate and rhythm, no S3 or significant systolic murmur.  ECG:  An ECG dated 03/01/2023 was personally reviewed today and demonstrated:  Sinus bradycardia with old anterior infarct pattern and occasional PVCs.  Labwork:  No interval lab work for review today.  Other Studies Reviewed Today:  No interval cardiac testing for review today.  Assessment and Plan:  1.  CAD status post remote anterior infarct in 2004 with angioplasty of the LAD, DES to the mid LAD for restenosis within 2011, Cutting Balloon angioplasty due to LAD in-stent restenosis in 2013, and DES to  the ostial circumflex in 2017.  Cardiac catheterization from July 2023 showed patent LAD and circumflex stent sites with mild to moderate residual disease best managed medically.  Echocardiogram from 2022 revealed LVEF 50 to 55% with wall motion abnormalities consistent with ischemic heart disease.  He is clinically stable from a cardiac perspective, no angina or interval nitroglycerin  use.  Should be able to proceed with right foot surgery under general anesthesia as planned, follow-up ECG reviewed and stable overall.  No clear indication for follow-up ischemic testing.  Can hold aspirin  7 days and Plavix  5 days prior to operation.  He is otherwise on Farxiga  10 mg daily, Imdur  30 mg daily, Pravachol 80 mg daily, and as needed nitroglycerin .   2.  CKD stage IIIa.  Creatinine 1.35 with GFR 57 in November 2023.   3.  Mixed hyperlipidemia.  LDL 62 in November 2023.  Continue Pravachol 80 mg daily.   4.  Primary hypertension.  Blood pressure adequately controlled today.  Continue Altace  10 mg twice daily.  Disposition:  Follow up 6 months.  Signed, Jayson JUDITHANN Sierras, M.D., F.A.C.C. [facility]

## 2024-01-25 NOTE — Patient Instructions (Signed)
 Medication Instructions:  Your physician recommends that you continue on your current medications as directed. Please refer to the Current Medication list given to you today.   Labwork: None today  Testing/Procedures: None today  Follow-Up: 6 months  Any Other Special Instructions Will Be Listed Below (If Applicable).  If you need a refill on your cardiac medications before your next appointment, please call your pharmacy.

## 2024-02-07 ENCOUNTER — Ambulatory Visit (INDEPENDENT_AMBULATORY_CARE_PROVIDER_SITE_OTHER): Admitting: Podiatry

## 2024-02-07 DIAGNOSIS — M960 Pseudarthrosis after fusion or arthrodesis: Secondary | ICD-10-CM

## 2024-02-07 DIAGNOSIS — Z9889 Other specified postprocedural states: Secondary | ICD-10-CM

## 2024-02-07 NOTE — Progress Notes (Signed)
 Subjective:  Patient ID: Steven Haas, male    DOB: 04/13/1951,  MRN: [id number]  Chief Complaint  Patient presents with   Discuss Surgery    Right foot. Had operation in January and ongoing issues. Last A1c: 6.7. Takes asa 81 mg and Eliquis. Seen by cardiology last Friday. His wife is with him today.     DOS: 08/01/2023 Procedure: Right foot medial double arthrodesis, bone marrow aspiration  73 y.o. male seen for post op check.  Patient following up in regards to surgical discussion for revision of right foot medial double arthrodesis.  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  He says his pain is primarily in the lateral rear foot more so located towards the subtalar joint then in the talonavicular joint where he does not have much pain with palpation especially medially he does have some at the lateral aspect of the joint.  Having difficulty walking walking with a significant limp.  Has pain and wants to have revision surgery performed.  Review of Systems: Negative except as noted in the HPI. Denies N/V/F/Ch.   Objective:  There were no vitals filed for this visit. There is no height or weight on file to calculate BMI. Constitutional Well developed. Well nourished.  Vascular Foot warm and well perfused. Capillary refill normal to all digits.   No calf pain with palpation  Neurologic Normal speech. Oriented to person, place, and time. Epicritic sensation intact  Dermatologic Incisions medial and lateral foot fully healed minimal scar  Orthopedic: Status post medial double arthrodesis.  Mild edema noted about the subtalar and talonavicular joint however not severe. Pain primarily on palpation of the anterior aspect of the ankle and subtalar joint especially near the lateral aspect of the TMJ and the  CC J.  Significant pain over the sinus tarsi area as well.  No pain medially with palpation of the navicular tuberosity.  Moderate pain at the lateral aspect of the talonavicular joint though this is more so towards the sinus tarsi area.   Radiographs: 12/25/23 XR 3 views WB R foot: Surgical hardware intact including 2 subtalar joint screws.  There appears to be good osseous infill across the subtalar joint similar to prior.  There is noted to be some fragmentation of the navicular medially however still seems to have improved osseous infill across the majority of the joint indicating good osseous fusion.  His 3 surgical staples are intact.  Questionable prominence especially of the lateral staple anteriorly at the ankle  CT right ankle without contrast 01/02/2024: 1. Status post subtalar fusion. There is a small area of bridging bone across the posterior facet of the subtalar joint centrally. The majority of the joint is not fused. Screws are intact without evidence of loosening. 2. Status post talonavicular joint fusion. Streak artifact limits evaluation but there appear to be multiple areas of bridging bone across the joint. The bone staples are intact without evidence of loosening. 3. 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 4. Likely loose bone fragment along the medial margin of the navicular as seen on prior plain films. 5. Mild tibiotalar osteoarthritis. 6. Findings suggestive of tibialis anterior tendinosis.   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  Plan:  Patient was evaluated and treated and all questions answered.  7 months postop s/p right  foot medial double arthrodesis and injection of bone marrow aspirate concentrate along posterior tibial tendon - Patient has continued pain with ambulation on the right foot which is worsened as he is increased his ambulatory status and weightbearing. 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 -My concern is more so at the subtalar joint given his symptoms and what location of his pain clinically however cannot rule out some portion of pain caused by the osteonecrosis of the lateral talonavicular joint.  Do not believe the medial aspect of the TN is causing significant pain -Discussed again surgical versus conservative treatment options.  I would recommend revision arthrodesis with bone grafting.  His vitamin D level has been checked and found to be 43.  Therefore I do not believe it was significantly due to vitamin D deficiency.  However will have him continue supplementing with vitamin D in the postop phase -Patient does wish to proceed with revision arthrodesis I explained I would remove the current hardware reprep the joints reinject bone marrow aspirate with bone grafting and he would need prolonged nonweightbearing course.  This does carry risk of repeat nonunion infection and need for further surgical intervention.  He is aware of this and wishes to proceed.  Informed surgical consent was obtained at this time.  I did offer him to have a second opinion however he wishes to proceed with me for surgical revision. - Will begin surgical planning.  Patient will need to hold his aspirin  Plavix  7 days preoperatively.  He has been cleared by his cardiologist to proceed with the procedure.        Steven Haas, DPM [facility] / [facility]

## 2024-02-08 ENCOUNTER — Telehealth: Payer: Self-pay | Admitting: Podiatry

## 2024-02-08 NOTE — Telephone Encounter (Signed)
 Patient called to inform Dr. Malvin, Vitamin D is looking good as per patient's primary doctor

## 2024-02-08 NOTE — Telephone Encounter (Signed)
 Returned call to patient and obtained level. Vitamin D is: 84

## 2024-02-11 ENCOUNTER — Telehealth: Payer: Self-pay | Admitting: Podiatry

## 2024-02-11 NOTE — Telephone Encounter (Signed)
 Pt left message about surgery.  I returned call and pt is scheduled for 8/6 at mc or at 1200pm.  He is aware of how to discontinue his medication as it was already discussed.  Pharmacy is correct in chart.

## 2024-02-13 NOTE — Pre-Procedure Instructions (Signed)
 Surgical Instructions   Your procedure is scheduled on February 20, 2024. Report to [facility] Main Entrance A at 10:15 A.M., then check in with the Admitting office. Any questions or running late day of surgery: call [phone]  Questions prior to your surgery date: call [phone], Monday-Friday, 8am-4pm. If you experience any cold or flu symptoms such as cough, fever, chills, shortness of breath, etc. between now and your scheduled surgery, please notify us  at the above number.     Remember:  Do not eat after midnight the night before your surgery   You may drink clear liquids until 9:15 AM the morning of your surgery.   Clear liquids allowed are: Water, Non-Citrus Juices (without pulp), Carbonated Beverages, Clear Tea (no milk, honey, etc.), Black Coffee Only (NO MILK, CREAM OR POWDERED CREAMER of any kind), and Gatorade.    Take these medicines the morning of surgery with A SIP OF WATER: doxazosin (CARDURA)  isosorbide  mononitrate (IMDUR )  pantoprazole  (PROTONIX )  pravastatin (PRAVACHOL)    May take these medicines IF NEEDED: acetaminophen  (TYLENOL )  nitroGLYCERIN  (NITROSTAT ) - if dose taken prior to surgery, please call [phone]   STOP taking your dapagliflozin  propanediol (FARXIGA ) three days prior to surgery. Your last dose will be August 2nd.  Follow your surgeon's instructions on when to stop Aspirin  and clopidogrel  (PLAVIX ).  If no instructions were given by your surgeon then you will need to call the office to get those instructions.     One week prior to surgery, STOP taking any Aleve, Naproxen, Ibuprofen , Motrin , Advil , Goody's, BC's, all herbal medications, fish oil, and non-prescription vitamins.   WHAT DO I DO ABOUT MY DIABETES MEDICATION?   Do not take glimepiride  (AMARYL ) the evening before surgery or the morning of surgery.  Do not take pioglitazone  (ACTOS ) the morning of surgery.   HOW TO MANAGE YOUR DIABETES BEFORE AND AFTER SURGERY  Why  is it important to control my blood sugar before and after surgery? Improving blood sugar levels before and after surgery helps healing and can limit problems. A way of improving blood sugar control is eating a healthy diet by:  Eating less sugar and carbohydrates  Increasing activity/exercise  Talking with your doctor about reaching your blood sugar goals High blood sugars (greater than 180 mg/dL) can raise your risk of infections and slow your recovery, so you will need to focus on controlling your diabetes during the weeks before surgery. Make sure that the doctor who takes care of your diabetes knows about your planned surgery including the date and location.  How do I manage my blood sugar before surgery? Check your blood sugar at least 4 times a day, starting 2 days before surgery, to make sure that the level is not too high or low.  Check your blood sugar the morning of your surgery when you wake up and every 2 hours until you get to the Short Stay unit.  If your blood sugar is less than 70 mg/dL, you will need to treat for low blood sugar: Do not take insulin . Treat a low blood sugar (less than 70 mg/dL) with  cup of clear juice (cranberry or apple), 4 glucose tablets, OR glucose gel. Recheck blood sugar in 15 minutes after treatment (to make sure it is greater than 70 mg/dL). If your blood sugar is not greater than 70 mg/dL on recheck, call [phone] for further instructions. Report your blood sugar to the short stay nurse when you get to Short Stay.  If you  are admitted to the hospital after surgery: Your blood sugar will be checked by the staff and you will probably be given insulin  after surgery (instead of oral diabetes medicines) to make sure you have good blood sugar levels. The goal for blood sugar control after surgery is 80-180 mg/dL.                      Do NOT Smoke (Tobacco/Vaping) for 24 hours prior to your procedure.  If you use a CPAP at night, you may bring  your mask/headgear for your overnight stay.   You will be asked to remove any contacts, glasses, piercing's, hearing aid's, dentures/partials prior to surgery. Please bring cases for these items if needed.    Patients discharged the day of surgery will not be allowed to drive home, and someone needs to stay with them for 24 hours.  SURGICAL WAITING ROOM VISITATION Patients may have no more than 2 support people in the waiting area - these visitors may rotate.   Pre-op nurse will coordinate an appropriate time for 1 ADULT support person, who may not rotate, to accompany patient in pre-op.  Children under the age of 29 must have an adult with them who is not the patient and must remain in the main waiting area with an adult.  If the patient needs to stay at the hospital during part of their recovery, the visitor guidelines for inpatient rooms apply.  Please refer to the [facility] website for the visitor guidelines for any additional information.   If you received a COVID test during your pre-op visit  it is requested that you wear a mask when out in public, stay away from anyone that may not be feeling well and notify your surgeon if you develop symptoms. If you have been in contact with anyone that has tested positive in the last 10 days please notify you surgeon.      Pre-operative CHG Bathing Instructions   You can play a key role in reducing the risk of infection after surgery. Your skin needs to be as free of germs as possible. You can reduce the number of germs on your skin by washing with CHG (chlorhexidine gluconate) soap before surgery. CHG is an antiseptic soap that kills germs and continues to kill germs even after washing.   DO NOT use if you have an allergy to chlorhexidine/CHG or antibacterial soaps. If your skin becomes reddened or irritated, stop using the CHG and notify one of our RNs at [phone].              TAKE A SHOWER THE NIGHT BEFORE SURGERY AND THE DAY OF  SURGERY    Please keep in mind the following:  DO NOT shave, including legs and underarms, 48 hours prior to surgery.   You may shave your face before/day of surgery.  Place clean sheets on your bed the night before surgery Use a clean washcloth (not used since being washed) for each shower. DO NOT sleep with pet's night before surgery.  CHG Shower Instructions:  Wash your face and private area with normal soap. If you choose to wash your hair, wash first with your normal shampoo.  After you use shampoo/soap, rinse your hair and body thoroughly to remove shampoo/soap residue.  Turn the water OFF and apply half the bottle of CHG soap to a CLEAN washcloth.  Apply CHG soap ONLY FROM YOUR NECK DOWN TO YOUR TOES (washing for 3-5 minutes)  DO  NOT use CHG soap on face, private areas, open wounds, or sores.  Pay special attention to the area where your surgery is being performed.  If you are having back surgery, having someone wash your back for you may be helpful. Wait 2 minutes after CHG soap is applied, then you may rinse off the CHG soap.  Pat dry with a clean towel  Put on clean pajamas    Additional instructions for the day of surgery: DO NOT APPLY any lotions, deodorants, cologne, or perfumes.   Do not wear jewelry or makeup Do not wear nail polish, gel polish, artificial nails, or any other type of covering on natural nails (fingers and toes) Do not bring valuables to the hospital. [facility] is not responsible for valuables/personal belongings. Put on clean/comfortable clothes.  Please brush your teeth.  Ask your nurse before applying any prescription medications to the skin.

## 2024-02-14 ENCOUNTER — Encounter (HOSPITAL_COMMUNITY)
Admission: RE | Admit: 2024-02-14 | Discharge: 2024-02-14 | Disposition: A | Discharge status: Home / Self Care | Source: Ambulatory Visit | Attending: Podiatry | Admitting: Podiatry

## 2024-02-14 ENCOUNTER — Other Ambulatory Visit: Payer: Self-pay

## 2024-02-14 ENCOUNTER — Encounter (HOSPITAL_COMMUNITY): Payer: Self-pay

## 2024-02-14 DIAGNOSIS — Z01818 Encounter for other preprocedural examination: Secondary | ICD-10-CM | POA: Insufficient documentation

## 2024-02-14 DIAGNOSIS — I2510 Atherosclerotic heart disease of native coronary artery without angina pectoris: Secondary | ICD-10-CM | POA: Diagnosis not present

## 2024-02-14 DIAGNOSIS — I252 Old myocardial infarction: Secondary | ICD-10-CM | POA: Insufficient documentation

## 2024-02-14 LAB — GLUCOSE, CAPILLARY: Glucose-Capillary: 258 mg/dL — ABNORMAL HIGH (ref 70–99)

## 2024-02-14 NOTE — Progress Notes (Addendum)
 PCP - Dr. Candyce Argyle Cardiologist - Dr. Jayson Sierras  PPM/ICD - denies   Chest x-ray - 05/25/16 EKG - 01/25/24 Stress Test - 03/03/21 ECHO - 03/03/21 Cardiac Cath - 02/10/22  Sleep Study - denies   Fasting Blood Sugar - 100-110 Checks Blood Sugar 1-2 times per week  Last dose of GLP1 agonist-  n/a   Blood Thinner Instructions: Pt states he was told to hold Plavix  7 days. Last dose 7/29. Aspirin  Instructions: Pt states he was told to hold 10 days. Last dose 7/28  ERAS Protcol - no, NPO   COVID TEST- n/a   Anesthesia review: yes, cardiac hx. Records requested. Pt says that he just had a full physical on 7/19 with his PCP. All labs done. I was unable to reach the PCP's office to speak to someone or obtain a fax number. Pt states that he is going to go by the office on the way home and give them my records request form so that they can fax labs and last OV note to me ASAP. Pt states that his A1C on 7/19 was 8.0.  Patient denies shortness of breath, fever, cough and chest pain at PAT appointment   All instructions explained to the patient, with a verbal understanding of the material. Patient agrees to go over the instructions while at home for a better understanding. The opportunity to ask questions was provided.

## 2024-02-14 NOTE — Pre-Procedure Instructions (Signed)
 Surgical Instructions   Your procedure is scheduled on February 20, 2024. Report to [facility] Main Entrance A at 10:15 A.M., then check in with the Admitting office. Any questions or running late day of surgery: call [phone]  Questions prior to your surgery date: call [phone], Monday-Friday, 8am-4pm. If you experience any cold or flu symptoms such as cough, fever, chills, shortness of breath, etc. between now and your scheduled surgery, please notify us  at the above number.     Remember:  Do not eat or drink after midnight the night before your surgery     Take these medicines the morning of surgery with A SIP OF WATER: doxazosin (CARDURA)  isosorbide  mononitrate (IMDUR )  pantoprazole  (PROTONIX )  pravastatin (PRAVACHOL)    May take these medicines IF NEEDED: acetaminophen  (TYLENOL )  nitroGLYCERIN  (NITROSTAT ) - if dose taken prior to surgery, please call [phone]   STOP taking your dapagliflozin  propanediol (FARXIGA ) three days prior to surgery. Your last dose will be August 2nd.  Follow your surgeon's instructions on when to stop Aspirin  and clopidogrel  (PLAVIX ).  If no instructions were given by your surgeon then you will need to call the office to get those instructions.     One week prior to surgery, STOP taking any Aleve, Naproxen, Ibuprofen , Motrin , Advil , Goody's, BC's, all herbal medications, fish oil, and non-prescription vitamins.   WHAT DO I DO ABOUT MY DIABETES MEDICATION?   Do not take glimepiride  (AMARYL ) the evening before surgery or the morning of surgery.  Do not take pioglitazone  (ACTOS ) the morning of surgery.   HOW TO MANAGE YOUR DIABETES BEFORE AND AFTER SURGERY  Why is it important to control my blood sugar before and after surgery? Improving blood sugar levels before and after surgery helps healing and can limit problems. A way of improving blood sugar control is eating a healthy diet by:  Eating less sugar and carbohydrates   Increasing activity/exercise  Talking with your doctor about reaching your blood sugar goals High blood sugars (greater than 180 mg/dL) can raise your risk of infections and slow your recovery, so you will need to focus on controlling your diabetes during the weeks before surgery. Make sure that the doctor who takes care of your diabetes knows about your planned surgery including the date and location.  How do I manage my blood sugar before surgery? Check your blood sugar at least 4 times a day, starting 2 days before surgery, to make sure that the level is not too high or low.  Check your blood sugar the morning of your surgery when you wake up and every 2 hours until you get to the Short Stay unit.  If your blood sugar is less than 70 mg/dL, you will need to treat for low blood sugar: Do not take insulin . Treat a low blood sugar (less than 70 mg/dL) with  cup of clear juice (cranberry or apple), 4 glucose tablets, OR glucose gel. Recheck blood sugar in 15 minutes after treatment (to make sure it is greater than 70 mg/dL). If your blood sugar is not greater than 70 mg/dL on recheck, call [phone] for further instructions. Report your blood sugar to the short stay nurse when you get to Short Stay.  If you are admitted to the hospital after surgery: Your blood sugar will be checked by the staff and you will probably be given insulin  after surgery (instead of oral diabetes medicines) to make sure you have good blood sugar levels. The goal for blood sugar control  after surgery is 80-180 mg/dL.                      Do NOT Smoke (Tobacco/Vaping) for 24 hours prior to your procedure.  If you use a CPAP at night, you may bring your mask/headgear for your overnight stay.   You will be asked to remove any contacts, glasses, piercing's, hearing aid's, dentures/partials prior to surgery. Please bring cases for these items if needed.    Patients discharged the day of surgery will not be allowed  to drive home, and someone needs to stay with them for 24 hours.  SURGICAL WAITING ROOM VISITATION Patients may have no more than 2 support people in the waiting area - these visitors may rotate.   Pre-op nurse will coordinate an appropriate time for 1 ADULT support person, who may not rotate, to accompany patient in pre-op.  Children under the age of 90 must have an adult with them who is not the patient and must remain in the main waiting area with an adult.  If the patient needs to stay at the hospital during part of their recovery, the visitor guidelines for inpatient rooms apply.  Please refer to the [facility] website for the visitor guidelines for any additional information.   If you received a COVID test during your pre-op visit  it is requested that you wear a mask when out in public, stay away from anyone that may not be feeling well and notify your surgeon if you develop symptoms. If you have been in contact with anyone that has tested positive in the last 10 days please notify you surgeon.      Pre-operative CHG Bathing Instructions   You can play a key role in reducing the risk of infection after surgery. Your skin needs to be as free of germs as possible. You can reduce the number of germs on your skin by washing with CHG (chlorhexidine gluconate) soap before surgery. CHG is an antiseptic soap that kills germs and continues to kill germs even after washing.   DO NOT use if you have an allergy to chlorhexidine/CHG or antibacterial soaps. If your skin becomes reddened or irritated, stop using the CHG and notify one of our RNs at [phone].              TAKE A SHOWER THE NIGHT BEFORE SURGERY AND THE DAY OF SURGERY    Please keep in mind the following:  DO NOT shave, including legs and underarms, 48 hours prior to surgery.   You may shave your face before/day of surgery.  Place clean sheets on your bed the night before surgery Use a clean washcloth (not used since being  washed) for each shower. DO NOT sleep with pet's night before surgery.  CHG Shower Instructions:  Wash your face and private area with normal soap. If you choose to wash your hair, wash first with your normal shampoo.  After you use shampoo/soap, rinse your hair and body thoroughly to remove shampoo/soap residue.  Turn the water OFF and apply half the bottle of CHG soap to a CLEAN washcloth.  Apply CHG soap ONLY FROM YOUR NECK DOWN TO YOUR TOES (washing for 3-5 minutes)  DO NOT use CHG soap on face, private areas, open wounds, or sores.  Pay special attention to the area where your surgery is being performed.  If you are having back surgery, having someone wash your back for you may be helpful. Wait 2 minutes  after CHG soap is applied, then you may rinse off the CHG soap.  Pat dry with a clean towel  Put on clean pajamas    Additional instructions for the day of surgery: DO NOT APPLY any lotions, deodorants, cologne, or perfumes.   Do not wear jewelry or makeup Do not wear nail polish, gel polish, artificial nails, or any other type of covering on natural nails (fingers and toes) Do not bring valuables to the hospital. [facility] is not responsible for valuables/personal belongings. Put on clean/comfortable clothes.  Please brush your teeth.  Ask your nurse before applying any prescription medications to the skin.

## 2024-02-15 ENCOUNTER — Encounter (HOSPITAL_COMMUNITY): Payer: Self-pay | Admitting: Physician Assistant

## 2024-02-15 NOTE — Progress Notes (Signed)
 Anesthesia Chart Review:  73 year old male follows with cardiology for history of HTN, HLD, CAD s/p remote anterior infarct in 2004 with angioplasty of the LAD, DES to the mid LAD for restenosis within 2011, Cutting Balloon angioplasty due to LAD in-stent restenosis in 2013, and DES to the ostial circumflex in 2017.  Cardiac catheterization from July 2023 showed patent LAD and circumflex stent sites with mild to moderate residual disease best managed medically.  Echocardiogram from 2022 revealed LVEF 50 to 55% with wall motion abnormalities consistent with ischemic heart disease.  Last seen by Dr. Debera 01/25/2024 for preop evaluation.  Per note, He is clinically stable from a cardiac perspective, no angina or interval nitroglycerin  use. Should be able to proceed with right foot surgery under general anesthesia as planned, follow-up ECG reviewed and stable overall. No clear indication for follow-up ischemic testing. Can hold aspirin  7 days and Plavix  5 days prior to operation. He is otherwise on Farxiga  10 mg daily, Imdur  30 mg daily, Pravachol 80 mg daily, and as needed nitroglycerin .  Other pertinent history includes non-insulin -dependent DM2, GERD on PPI, CKD 2.  Per podiatry telephone encounter 02/18/2024, procedure is being postponed due to patient's uncontrolled DM2, A1c 8.1.  Full blood work scanned in under media tab.  EKG 01/25/2024: Sinus rhythm with occasional Premature ventricular complexes.  Rate 71. Anterolateral infarct (cited on or before 09-Jun-2011). Repolarization changes  Cath 02/10/2022:   Ost Cx to Mid Cx lesion is 20% stenosed.   Mid Cx to Dist Cx lesion is 30% stenosed.   2nd LPL lesion is 30% stenosed.   Prox LAD to Mid LAD lesion is 20% stenosed.   Mid LAD to Dist LAD lesion is 50% stenosed.   Ost LAD to Prox LAD lesion is 40% stenosed.   Mild proximal LAD stenosis. Patent mid LAD stents with minimal restenosis. Diffuse moderate distal LAD stenosis.  The Circumflex is a  large dominant vessel with patent ostial/proximal stented segment with minimal restenosis. Mild to moderate stenosis in the third obtuse marginal branch.  Small non-dominant RCA LVEDP 17 mmHg   Recommendations: Continue medical management of CAD.   TTE 03/03/2021:  1. Left ventricular ejection fraction, by estimation, is 50 to 55%. The  left ventricle has low normal function. The left ventricle demonstrates  regional wall motion abnormalities (see scoring diagram/findings for  description). Left ventricular diastolic   parameters are indeterminate.   2. Right ventricular systolic function is normal. The right ventricular  size is normal. Tricuspid regurgitation signal is inadequate for assessing  PA pressure.   3. Left atrial size was moderately dilated.   4. Right atrial size was mildly dilated.   5. The mitral valve is grossly normal. No evidence of mitral valve  regurgitation.   6. The aortic valve is tricuspid. Aortic valve regurgitation is not  visualized.   7. The inferior vena cava is normal in size with greater than 50%  respiratory variability, suggesting right atrial pressure of 3 mmHg.   Comparison(s): Prior images reviewed side by side. LVEF has improved  somewhat.     [place], [facility] Short Stay Center/Anesthesiology Phone [phone] 02/18/2024 3:35 PM

## 2024-02-18 ENCOUNTER — Telehealth: Payer: Self-pay

## 2024-02-18 NOTE — Telephone Encounter (Addendum)
 Per conversation with Dr. Malvin: surgery will need to be pushed back until patient's A1c is below 7.5.  Patient informed of Dr. Emmitt instructions. Patient is concerned about not being able to get his A1c back down to desirable level due to the limited amount of physical activity he is currently able to do. He explained to me that 8.1 is not that bad. Patient informed again of Dr. Emmitt reasoning behind pushing surgery back. Patient also informed that Dr. Malvin would be informed of conversation.

## 2024-02-18 NOTE — Telephone Encounter (Signed)
Noted and thank you so much!

## 2024-02-18 NOTE — Telephone Encounter (Signed)
 Patient would like to know if Dr. Malvin can give him a call to discuss this issue further.

## 2024-02-18 NOTE — Telephone Encounter (Signed)
 Secure message sent this morning regarding need for updated A1c. Patient was contacted and he informed me that labs were done very recently. After chart review under media tab, labs were scanned in today. Hemoglobin A1c ws 8.1 on 02/02/2024. Messaged provider and sending telephone encounter for further documentation if needed.

## 2024-02-20 ENCOUNTER — Encounter (HOSPITAL_COMMUNITY): Admission: RE | Payer: Self-pay | Source: Home / Self Care

## 2024-02-20 ENCOUNTER — Ambulatory Visit (HOSPITAL_COMMUNITY): Admission: RE | Admit: 2024-02-20 | Source: Home / Self Care | Admitting: Podiatry

## 2024-02-20 SURGERY — FUSION, TALONAVICULAR JOINT
Anesthesia: General | Site: Foot | Laterality: Right

## 2024-02-23 ENCOUNTER — Other Ambulatory Visit: Payer: Self-pay | Admitting: Cardiology

## 2024-02-28 ENCOUNTER — Encounter: Admitting: Podiatry

## 2024-03-26 ENCOUNTER — Telehealth: Payer: Self-pay | Admitting: Lab

## 2024-03-26 NOTE — Telephone Encounter (Signed)
 Patient states AC is down from 8.1 to 7.4 you needing to know if he still needs to come in for appointment if able to move forward with procedure if level isn't down enough let him know so he doesn't drive 1 hour for appointment please advise.

## 2024-04-01 ENCOUNTER — Encounter: Admitting: Podiatry

## 2024-04-11 ENCOUNTER — Telehealth: Payer: Self-pay | Admitting: Lab

## 2024-04-11 NOTE — Telephone Encounter (Signed)
 Patient is inquiring about having surgery no one has contacted him states provided A1c. Please contact patient.

## 2024-04-15 ENCOUNTER — Telehealth: Payer: Self-pay | Admitting: Podiatry

## 2024-04-15 NOTE — Telephone Encounter (Signed)
 Pt left message as he has not heard anything about getting his surgery scheduled since giving his A1C to the office.   I called pt and we are looking at 10/15 and I will send letter to cardiology to see if they can give clearance again.

## 2024-04-16 NOTE — Telephone Encounter (Signed)
 Pt scheduled for 10/15

## 2024-04-21 ENCOUNTER — Telehealth: Payer: Self-pay

## 2024-04-21 ENCOUNTER — Encounter: Payer: Self-pay | Admitting: Podiatry

## 2024-04-21 NOTE — Telephone Encounter (Signed)
   Pre-operative Risk Assessment    Patient Name: Steven Haas  DOB: Jun 19, 1951 MRN: [id number]   Date of last office visit: 01/25/24 JAYSON SIERRAS, MD Date of next office visit: NONE   Request for Surgical Clearance    Procedure:  FOOT SURGERY  Date of Surgery:  Clearance 04/30/24                                Surgeon:  MARSA HONOUR DPM Surgeon's Group or Practice Name:  [facility] Phone number:  [phone] Fax number:  [phone]  ATTN: MARYSSA   Type of Clearance Requested:   - Medical  - Pharmacy:  Hold Aspirin  and Clopidogrel  (Plavix )     Type of Anesthesia:  General    Additional requests/questions:    SignedLucie DELENA Ku   04/21/2024, 5:52 PM

## 2024-04-22 NOTE — Telephone Encounter (Signed)
   Name: Steven Haas  DOB: December 23, 1950  MRN: [id number]   Primary Cardiologist: Jayson Sierras, MD  Chart reviewed as part of pre-operative protocol coverage. Steven Haas was last seen on 01/25/2024 by Dr. Sierras.  Per Dr. Gates office note He is clinically stable from a cardiac perspective, no angina or interval nitroglycerin  use. Should be able to proceed with right foot surgery under general anesthesia as planned, follow-up ECG reviewed and stable overall. No clear indication for follow-up ischemic testing. Can hold aspirin  7 days and Plavix  5 days prior to operation. He is otherwise on Farxiga  10 mg daily, Imdur  30 mg daily, Pravachol 80 mg daily, and as needed nitroglycerin .  Therefore, based on ACC/AHA guidelines, the patient would be an acceptable risk for the planned procedure without further cardiovascular testing.   Per office protocol, he may hold Plavix  for 5 days and aspirin  for 7 days prior to procedure and should resume as soon as hemodynamically stable postoperatively.   I will route this recommendation to the requesting party via Epic fax function and remove from pre-op pool. Please call with questions.  Barnie Hila, NP 04/22/2024, 1:22 PM

## 2024-04-29 ENCOUNTER — Encounter (HOSPITAL_COMMUNITY): Payer: Self-pay | Admitting: Vascular Surgery

## 2024-04-29 ENCOUNTER — Encounter (HOSPITAL_COMMUNITY): Payer: Self-pay | Admitting: Podiatry

## 2024-04-29 ENCOUNTER — Other Ambulatory Visit: Payer: Self-pay

## 2024-04-29 NOTE — Progress Notes (Signed)
 PCP - Dr Candyce Argyle Cardiologist-Dr Jayson Sierras (clearance on 04/21/24)  Chest x-ray - n/a EKG - 01/25/24 Stress Test - 03/03/21 ECHO - 03/03/21 Cardiac Cath - 02/10/22  ICD Pacemaker/Loop - n/a  Sleep Study -  n/a  Diabetes Type 2 Do not take Actos  on the morning of surgery.  THE NIGHT BEFORE SURGERY, do not take Glimepiride  evening dose or on the morning of surgery dose (DOS).   Hold Farxiga  for 72 hours prior to procedure.  SWD call.  Last dose was on 04/29/24 per pt.   If your blood sugar is less than 70 mg/dL, you will need to treat for low blood sugar: Treat a low blood sugar (less than 70 mg/dL) with  cup of clear juice (cranberry or apple), 4 glucose tablets, OR glucose gel. Recheck blood sugar in 15 minutes after treatment (to make sure it is greater than 70 mg/dL). If your blood sugar is not greater than 70 mg/dL on recheck, call [phone] for further instructions.  Plavix  Instructions:  Last dose was on 04/24/24.  Aspirin  Instructions:  Last dose was on 04/22/24.  ERAS - clear liquids til 0945 on DOS.  Anesthesia review: Yes  STOP now taking any Aspirin  (unless otherwise instructed by your surgeon), Aleve, Naproxen, Ibuprofen , Motrin , Advil , Goody's, BC's, all herbal medications, fish oil, and all vitamins.   Coronavirus Screening Do you have any of the following symptoms:  Cough yes/no: No Fever (>100.6F)  yes/no: No Runny nose yes/no: No Sore throat yes/no: No Difficulty breathing/shortness of breath  yes/no: No  Have you traveled in the last 14 days and where? yes/no: No  Patient verbalized understanding of instructions that were given via phone.

## 2024-04-29 NOTE — Progress Notes (Signed)
 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 Surgery was initially scheduled for 02/20/2024, but it was postponed due to his A1c being 8.1% on 02/02/2024. Reportedly, A1c down to 7.4% on 03/26/2024. Last office note and labs requested from his PCP Dr. Viviana in [place], [place].   History includes never smoker, CAD (anterior MI s/p pLAD stent ~ 2004 in [place], s/p DES for ISR 02/03/2010; cutting balloon PCI LAD for ISR 11/16/2011; s/p rotational atherectomy/DES ostial CX 11/13/207), ischemic cardiomyopathy, sinus bradycardia, HTN, DM2, CKD (stage II), GERD, nephrolithiasis, spinal surgery  He is followed by cardiologist Dr. Debera. He has has had multiple PCI since 2004 as above. Last cardiac catheterization from July 2023 showed patent LAD and circumflex stent sites with mild to moderate residual disease best managed medically.  Echocardiogram from 2022 revealed LVEF 50 to 55% with wall motion abnormalities consistent with ischemic heart disease.  Last seen by Dr. Debera 01/25/2024 for preoperative evaluation.  Per note, He is clinically stable from a cardiac perspective, no angina or interval nitroglycerin  use. Should be able to proceed with right foot surgery under general anesthesia as planned, follow-up ECG reviewed and stable overall. No clear indication for follow-up ischemic testing. Can hold aspirin  7 days and Plavix  5 days prior to operation. He is otherwise on Farxiga  10 mg daily, Imdur  30 mg  daily, Pravachol 80 mg daily, and as needed nitroglycerin . He had updated clearance/cardiology chart review on 04/22/2024 by Loistine Sober, NP who added, Therefore, based on ACC/AHA guidelines, the patient would be an acceptable risk for the planned procedure without further cardiovascular testing.    Per office protocol, he may hold Plavix  for 5 days and aspirin  for 7 days prior to procedure and should resume as soon as hemodynamically stable postoperatively.    Last dose of Plavix  was 04/24/2024 and last dose of Aspirin  was 04/22/2024.   Last office note and labs requested from Dr. Viviana. Visit date was from 03/26/2024. PAT RN Lindsi reached out to Dr. Newton regarding need for updated H&P.    VS: Ht 6' 4 (1.93 m)   Wt 90.7 kg   BMI 24.34 kg/m  02/14/2024 BP 136/65, HR 63    PROVIDERS: Park, Candyce HERO, MD is PCP [facility] in [place], [place]; [phone])   LABS: For day of procedure as indicated. Last results from Dr. Loran office dated 03/19/2024 (scanned under Media tab) show glucose 83, BUN 23, creatinine 1.33, eGFR 58, sodium 140, potassium 4.3, calcium  9.4, hemoglobin A1c 7.4%.  CBC done there on 02/02/2024 showed WBC 5.6, hemoglobin 15.8, hematocrit 48.2, platelet count 239.  Creatinine then was 1.43.  IMAGES: CT Right Ankle & Foot 01/02/2024: IMPRESSION: 1. Status post subtalar fusion. There is a small area of bridging bone across the posterior facet of the subtalar joint centrally. The majority of the joint is not fused. Screws are intact without evidence of loosening. 2. Status post talonavicular joint fusion.  Streak artifact limits evaluation but there appear to be multiple areas of bridging bone across the joint. The bone staples are intact without evidence of loosening. 3. 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 4. Likely loose bone fragment along the medial margin of the navicular as seen on prior  plain films. 5. Mild tibiotalar osteoarthritis. 6. Findings suggestive of tibialis anterior tendinosis.   EKG 01/25/2024: Sinus rhythm with occasional Premature ventricular complexes.  Rate 71. Anterolateral infarct (cited on or before 09-Jun-2011). Repolarization changes    CV: Cath 02/10/2022:   Ost Cx to Mid Cx lesion is 20% stenosed.   Mid Cx to Dist Cx lesion is 30% stenosed.   2nd LPL lesion is 30% stenosed.   Prox LAD to Mid LAD lesion is 20% stenosed.   Mid LAD to Dist LAD lesion is 50% stenosed.   Ost LAD to Prox LAD lesion is 40% stenosed.   Mild proximal LAD stenosis. Patent mid LAD stents with minimal restenosis. Diffuse moderate distal LAD stenosis.  The Circumflex is a large dominant vessel with patent ostial/proximal stented segment with minimal restenosis. Mild to moderate stenosis in the third obtuse marginal branch.  Small non-dominant RCA LVEDP 17 mmHg   Recommendations: Continue medical management of CAD.    TTE 03/03/2021:  1. Left ventricular ejection fraction, by estimation, is 50 to 55%. The  left ventricle has low normal function. The left ventricle demonstrates  regional wall motion abnormalities (see scoring diagram/findings for  description). Left ventricular diastolic   parameters are indeterminate.   2. Right ventricular systolic function is normal. The right ventricular  size is normal. Tricuspid regurgitation signal is inadequate for assessing  PA pressure.   3. Left atrial size was moderately dilated.   4. Right atrial size was mildly dilated.   5. The mitral valve is grossly normal. No evidence of mitral valve  regurgitation.   6. The aortic valve is tricuspid. Aortic valve regurgitation is not  visualized.   7. The inferior vena cava is normal in size with greater than 50%  respiratory variability, suggesting right atrial pressure of 3 mmHg.   Comparison(s): Prior images reviewed side by side. LVEF has improved  somewhat.     Past Medical  History:  Diagnosis Date   CKD (chronic kidney disease), stage II    Coronary atherosclerosis of native coronary artery    Remote MI 2004 s/p LAD PCI in [place], s/p DES to mid LAD for ISR 01/2010, s/p cutting balloon PCI to prox LAD for ISR 11/16/11, DES ostial circumflex 05/2016 [facility]   Essential hypertension    GERD (gastroesophageal reflux disease)    History of kidney stones    passed stone   Ischemic cardiomyopathy    Myocardial infarction [facility]) 2003   x2   Pneumonia    x 3   Sinus bradycardia    Metoprolol  stopped 11/2011 due to symptomatic bradycardia.   Type 2 diabetes mellitus [facility])     Past Surgical History:  Procedure Laterality Date   CATARACT EXTRACTION Bilateral    ELBOW SURGERY Left    Left   FOOT SURGERY Right 08/01/2023   LAPAROSCOPIC CHOLECYSTECTOMY  12/23/2013   Dr. Ellaree Maranda COME   LEFT AND RIGHT HEART CATHETERIZATION WITH CORONARY ANGIOGRAM  06/09/2011   Procedure: LEFT AND RIGHT HEART CATHETERIZATION WITH CORONARY ANGIOGRAM;  Surgeon: Debby JONETTA Como, MD;  Location: [facility] CATH LAB;  Service: Cardiovascular;;   LEFT HEART CATH AND CORONARY ANGIOGRAPHY  N/A 02/10/2022   Procedure: LEFT HEART CATH AND CORONARY ANGIOGRAPHY;  Surgeon: Verlin Lonni BIRCH, MD;  [facility] INVASIVE CV LAB;  Service: Cardiovascular;  Laterality: N/A;   LEFT HEART CATHETERIZATION WITH CORONARY ANGIOGRAM N/A 11/16/2011   Procedure: LEFT HEART CATHETERIZATION WITH CORONARY ANGIOGRAM;  Surgeon: Ezra GORMAN Shuck, MD;  Location: [facility] CATH LAB;  Service: Cardiovascular;  Laterality: N/A;   LEFT HEART CATHETERIZATION WITH CORONARY ANGIOGRAM N/A 06/24/2013   Procedure: LEFT HEART CATHETERIZATION WITH CORONARY ANGIOGRAM;  Surgeon: Lynwood Schilling, MD;  Location: [facility] CATH LAB;  Service: Cardiovascular;  Laterality: N/A;   NOSE SURGERY     Tumors   SHOULDER SURGERY     Right   SHOULDER, ARTHROPLASTY GLENOHUMERAL JOINT; TOTAL SHOULDER  03/28/2017   in [place]   SPINE LUMBAR LAMINOTOMY  (HEMILAMINECTOMY) W/ DECOMPRESSION OF NERVE ROOT(S) 1 INTERSPACE  09/22/2021   in [place]   WRIST FRACTURE SURGERY Bilateral     MEDICATIONS: No current facility-administered medications for this encounter.    acetaminophen  (TYLENOL ) 500 MG tablet   aspirin  81 MG tablet   clopidogrel  (PLAVIX ) 75 MG tablet   dapagliflozin  propanediol (FARXIGA ) 10 MG TABS tablet   doxazosin (CARDURA) 1 MG tablet   furosemide  (LASIX ) 40 MG tablet   glimepiride  (AMARYL ) 2 MG tablet   ibuprofen  (ADVIL ) 800 MG tablet   isosorbide  mononitrate (IMDUR ) 30 MG 24 hr tablet   nitroGLYCERIN  (NITROSTAT ) 0.4 MG SL tablet   pantoprazole  (PROTONIX ) 40 MG tablet   pioglitazone  (ACTOS ) 30 MG tablet   potassium chloride (KLOR-CON) 10 MEQ tablet   pravastatin (PRAVACHOL) 80 MG tablet   ramipril  (ALTACE ) 10 MG capsule   Isaiah Ruder, PA-C Surgical Short Stay/Anesthesiology [facility] Phone [phone] [facility] Phone [phone] 04/29/2024 7:11 PM

## 2024-04-29 NOTE — Progress Notes (Signed)
 Patient has not had an H&P within 30 days.  Notified Dr. Malvin.

## 2024-04-30 ENCOUNTER — Ambulatory Visit (HOSPITAL_COMMUNITY): Admission: RE | Admit: 2024-04-30 | Admitting: Podiatry

## 2024-04-30 ENCOUNTER — Encounter: Admission: RE | Payer: Self-pay

## 2024-04-30 ENCOUNTER — Encounter (HOSPITAL_COMMUNITY): Payer: Self-pay | Admitting: Vascular Surgery

## 2024-04-30 ENCOUNTER — Telehealth: Payer: Self-pay | Admitting: Podiatry

## 2024-04-30 HISTORY — DX: Personal history of urinary calculi: Z87.442

## 2024-04-30 HISTORY — DX: Pneumonia, unspecified organism: J18.9

## 2024-04-30 SURGERY — FUSION, TALONAVICULAR JOINT
Anesthesia: General | Site: Foot | Laterality: Right

## 2024-04-30 NOTE — Telephone Encounter (Signed)
 Spoke with Anabell this morning in regards to communication between her and Sr.Standiford. Pt was scheduled for a procedure today at [facility]. Pt did not have an up to date H&P form in order for procedure to be done. Patient was contacted and informed of this yesterday evening. Patients PCP wants to see patient before they will give surgical clearance. I reached out to the PCP's office this morning to get a time frame of when patient would be able to be seen. I then called patient and advised him of what the PCP office had told me. Patient expressed his frustration with the fact that his surgery had to be rescheduled. I relayed our apologies to patient and he is going to see when he is able to get in with PCP and contact me back to let me know how he wishes to proceed.

## 2024-05-01 ENCOUNTER — Telehealth: Payer: Self-pay | Admitting: Podiatry

## 2024-05-01 NOTE — Telephone Encounter (Signed)
 Patient called back in to set up surgery date. He is scheduled for a visit with his PCP on 10/22. I rescheduled his surgery for 10/29 at [facility]. H&P form has been faxed to PCP and we have received fax confirmation. When updating Anabell about patient she advised me to check in with you to approve this. Please advise, thanks.

## 2024-05-08 ENCOUNTER — Encounter: Admitting: Podiatry

## 2024-05-13 ENCOUNTER — Other Ambulatory Visit: Payer: Self-pay

## 2024-05-13 ENCOUNTER — Encounter (HOSPITAL_COMMUNITY): Payer: Self-pay | Admitting: Podiatry

## 2024-05-13 NOTE — Progress Notes (Signed)
 PCP - Dr Candyce Argyle (05/13/24 H&P on chart) Cardiologist - Dr Jayson Sierras (clearance 04/22/24)  Chest x-ray - n/a EKG - 01/25/24 Stress Test - 03/03/21 ECHO - 03/03/21 Cardiac Cath - 02/10/22  ICD Pacemaker/Loop - n/a  Sleep Study -  n/a  Diabetes Type 2 Do not take Glimepiride  tonight (Tues) or on the morning of surgery.      Do not take Actos  on the morning of surgery.  Farxiga  Instructions:  Hold for 72 hours prior to procedure.  Last dose was on Saturday, 05/10/24.    Plavix  Instructions: last dose was on 05/07/24  Aspirin  Instructions: last dose was on 05/07/24  NPO  Anesthesia review: Yes  STOP now taking any Aspirin  (unless otherwise instructed by your surgeon), Aleve, Naproxen, Ibuprofen , Motrin , Advil , Goody's, BC's, all herbal medications, fish oil, and all vitamins.   Coronavirus Screening Do you have any of the following symptoms:  Cough yes/no: No Fever (>100.26F)  yes/no: No Runny nose yes/no: No Sore throat yes/no: No Difficulty breathing/shortness of breath  yes/no: No  Have you traveled in the last 14 days and where? yes/no: No  Patient verbalized understanding of instructions that were given via phone.

## 2024-05-13 NOTE — H&P (View-Only) (Signed)
 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  Surgery was initially scheduled for 04/30/2024, but delayed until he could have an updated H&P without 30 days of surgery. (See my previous note from Date of Service 04/29/2024.) Since then he has H&P form completed by Dr. Candyce Argyle on 05/07/2024 (scanned under Media tab). He noted that patient's A1c in 03/2024 was 7.4%, ok to proceed w/ right foot procedure considered low medical risk.  History includes never smoker, CAD (anterior MI s/p pLAD stent ~ 2004 in [place], s/p DES for ISR 02/03/2010; cutting balloon PCI LAD for ISR 11/16/2011; s/p rotational atherectomy/DES ostial CX 11/13/207), ischemic cardiomyopathy, sinus bradycardia, HTN, DM2, CKD (stage II), GERD, nephrolithiasis, spinal surgery   He is followed by cardiologist Dr. Debera. He has has had multiple PCI since 2004 as above. Last cardiac catheterization from July 2023 showed patent LAD and circumflex stent sites with mild to moderate residual disease best managed medically.  Echocardiogram from 2022 revealed LVEF 50 to 55% with wall motion abnormalities consistent with ischemic heart disease.  Last seen by Dr. Debera 01/25/2024 for preoperative evaluation.  Per note, He is clinically stable from a cardiac perspective, no angina or interval nitroglycerin  use. Should be able to proceed with right foot surgery under general anesthesia as planned, follow-up ECG reviewed and stable overall. No clear  indication for follow-up ischemic testing. Can hold aspirin  7 days and Plavix  5 days prior to operation. He is otherwise on Farxiga  10 mg daily, Imdur  30 mg daily, Pravachol 80 mg daily, and as needed nitroglycerin . He had updated clearance/cardiology chart review on 04/22/2024 by Loistine Sober, NP who added, Therefore, based on ACC/AHA guidelines, the patient would be an acceptable risk for the planned procedure without further cardiovascular testing.    Per office protocol, he may hold Plavix  for 5 days and aspirin  for 7 days prior to procedure and should resume as soon as hemodynamically stable postoperatively.  Last Farxiga  was 05/10/2024.  Last Plavix  and aspirin  was 05/07/2024.  Anesthesia team to evaluate on the day of surgery.  Steven Ruder, PA-C Surgical Short Stay/Anesthesiology [facility] Phone [phone] [facility] Phone [phone] 05/13/2024 11:55 AM

## 2024-05-13 NOTE — Progress Notes (Signed)
 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  Surgery was initially scheduled for 04/30/2024, but delayed until he could have an updated H&P without 30 days of surgery. (See my previous note from Date of Service 04/29/2024.) Since then he has H&P form completed by Dr. Candyce Argyle on 05/07/2024 (scanned under Media tab). He noted that patient's A1c in 03/2024 was 7.4%, ok to proceed w/ right foot procedure considered low medical risk.  History includes never smoker, CAD (anterior MI s/p pLAD stent ~ 2004 in [place], s/p DES for ISR 02/03/2010; cutting balloon PCI LAD for ISR 11/16/2011; s/p rotational atherectomy/DES ostial CX 11/13/207), ischemic cardiomyopathy, sinus bradycardia, HTN, DM2, CKD (stage II), GERD, nephrolithiasis, spinal surgery   He is followed by cardiologist Dr. Debera. He has has had multiple PCI since 2004 as above. Last cardiac catheterization from July 2023 showed patent LAD and circumflex stent sites with mild to moderate residual disease best managed medically.  Echocardiogram from 2022 revealed LVEF 50 to 55% with wall motion abnormalities consistent with ischemic heart disease.  Last seen by Dr. Debera 01/25/2024 for preoperative evaluation.  Per note, He is clinically stable from a cardiac perspective, no angina or interval nitroglycerin  use. Should be able to proceed with right foot surgery under general anesthesia as planned, follow-up ECG reviewed and stable overall. No clear  indication for follow-up ischemic testing. Can hold aspirin  7 days and Plavix  5 days prior to operation. He is otherwise on Farxiga  10 mg daily, Imdur  30 mg daily, Pravachol 80 mg daily, and as needed nitroglycerin . He had updated clearance/cardiology chart review on 04/22/2024 by Loistine Sober, NP who added, Therefore, based on ACC/AHA guidelines, the patient would be an acceptable risk for the planned procedure without further cardiovascular testing.    Per office protocol, he may hold Plavix  for 5 days and aspirin  for 7 days prior to procedure and should resume as soon as hemodynamically stable postoperatively.  Last Farxiga  was 05/10/2024.  Last Plavix  and aspirin  was 05/07/2024.  Anesthesia team to evaluate on the day of surgery.  Isaiah Ruder, PA-C Surgical Short Stay/Anesthesiology [facility] Phone [phone] [facility] Phone [phone] 05/13/2024 11:55 AM

## 2024-05-13 NOTE — Anesthesia Preprocedure Evaluation (Signed)
 Anesthesia Evaluation  Patient identified by MRN, date of birth, ID band Patient awake    Reviewed: Allergy & Precautions, NPO status , Patient's Chart, lab work & pertinent test results  Airway Mallampati: II  TM Distance: <3 FB Neck ROM: Full    Dental  (+) Teeth Intact, Dental Advisory Given   Pulmonary neg pulmonary ROS   Pulmonary exam normal breath sounds clear to auscultation       Cardiovascular hypertension, Pt. 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  Reproductive/Obstetrics                              Anesthesia Physical Anesthesia Plan  ASA: 3  Anesthesia Plan: General   Post-op Pain Management: Regional block* and Tylenol  PO (pre-op)*   Induction: Intravenous  PONV Risk Score and Plan: 2 and Midazolam , Dexamethasone and Ondansetron   Airway Management Planned: LMA  Additional Equipment:   Intra-op Plan:   Post-operative Plan: Extubation in OR  Informed Consent: I have reviewed the patients History and Physical, chart, labs and discussed the procedure including the risks, benefits and alternatives for the proposed anesthesia with the patient or authorized representative who has indicated his/her understanding and acceptance.     Dental advisory given  Plan Discussed with: CRNA  Anesthesia Plan Comments: (PAT note written 05/13/2024 by Donisha Hoch, PA-C.  )         Anesthesia Quick  Evaluation

## 2024-05-14 ENCOUNTER — Ambulatory Visit (HOSPITAL_COMMUNITY): Payer: Self-pay | Admitting: Vascular Surgery

## 2024-05-14 ENCOUNTER — Encounter (HOSPITAL_COMMUNITY): Payer: Self-pay | Admitting: Podiatry

## 2024-05-14 ENCOUNTER — Encounter (HOSPITAL_COMMUNITY)
Admission: RE | Disposition: A | Discharge status: Home / Self Care | Payer: Self-pay | Source: Home / Self Care | Attending: Podiatry

## 2024-05-14 ENCOUNTER — Ambulatory Visit (HOSPITAL_COMMUNITY)

## 2024-05-14 ENCOUNTER — Ambulatory Visit (HOSPITAL_COMMUNITY)
Admission: RE | Admit: 2024-05-14 | Discharge: 2024-05-14 | Disposition: A | Discharge status: Home / Self Care | Attending: Podiatry | Admitting: Podiatry

## 2024-05-14 DIAGNOSIS — I129 Hypertensive chronic kidney disease with stage 1 through stage 4 chronic kidney disease, or unspecified chronic kidney disease: Secondary | ICD-10-CM

## 2024-05-14 DIAGNOSIS — M13871 Other specified arthritis, right ankle and foot: Secondary | ICD-10-CM | POA: Diagnosis not present

## 2024-05-14 DIAGNOSIS — I10 Essential (primary) hypertension: Secondary | ICD-10-CM | POA: Diagnosis not present

## 2024-05-14 DIAGNOSIS — Z981 Arthrodesis status: Secondary | ICD-10-CM | POA: Insufficient documentation

## 2024-05-14 DIAGNOSIS — Z7984 Long term (current) use of oral hypoglycemic drugs: Secondary | ICD-10-CM | POA: Diagnosis not present

## 2024-05-14 DIAGNOSIS — G8929 Other chronic pain: Secondary | ICD-10-CM | POA: Insufficient documentation

## 2024-05-14 DIAGNOSIS — T84018A Broken internal joint prosthesis, other site, initial encounter: Secondary | ICD-10-CM | POA: Insufficient documentation

## 2024-05-14 DIAGNOSIS — Z472 Encounter for removal of internal fixation device: Secondary | ICD-10-CM | POA: Diagnosis not present

## 2024-05-14 DIAGNOSIS — M25571 Pain in right ankle and joints of right foot: Secondary | ICD-10-CM | POA: Insufficient documentation

## 2024-05-14 DIAGNOSIS — I2510 Atherosclerotic heart disease of native coronary artery without angina pectoris: Secondary | ICD-10-CM

## 2024-05-14 DIAGNOSIS — Z955 Presence of coronary angioplasty implant and graft: Secondary | ICD-10-CM | POA: Diagnosis not present

## 2024-05-14 DIAGNOSIS — E119 Type 2 diabetes mellitus without complications: Secondary | ICD-10-CM | POA: Diagnosis not present

## 2024-05-14 DIAGNOSIS — I252 Old myocardial infarction: Secondary | ICD-10-CM | POA: Diagnosis not present

## 2024-05-14 DIAGNOSIS — N182 Chronic kidney disease, stage 2 (mild): Secondary | ICD-10-CM | POA: Diagnosis not present

## 2024-05-14 DIAGNOSIS — M25871 Other specified joint disorders, right ankle and foot: Secondary | ICD-10-CM | POA: Diagnosis not present

## 2024-05-14 DIAGNOSIS — M960 Pseudarthrosis after fusion or arthrodesis: Secondary | ICD-10-CM | POA: Diagnosis present

## 2024-05-14 DIAGNOSIS — K219 Gastro-esophageal reflux disease without esophagitis: Secondary | ICD-10-CM | POA: Diagnosis not present

## 2024-05-14 DIAGNOSIS — X58XXXA Exposure to other specified factors, initial encounter: Secondary | ICD-10-CM | POA: Diagnosis not present

## 2024-05-14 HISTORY — PX: FUSION OF TALONAVICULAR JOINT: SHX6332

## 2024-05-14 HISTORY — PX: FOOT ARTHRODESIS: SHX1655

## 2024-05-14 HISTORY — PX: HARVEST BONE GRAFT: SHX377

## 2024-05-14 LAB — CBC
HCT: 42.5 % (ref 39.0–52.0)
Hemoglobin: 14.4 g/dL (ref 13.0–17.0)
MCH: 30 pg (ref 26.0–34.0)
MCHC: 33.9 g/dL (ref 30.0–36.0)
MCV: 88.5 fL (ref 80.0–100.0)
Platelets: 190 K/uL (ref 150–400)
RBC: 4.8 MIL/uL (ref 4.22–5.81)
RDW: 12.9 % (ref 11.5–15.5)
WBC: 5.3 K/uL (ref 4.0–10.5)
nRBC: 0 % (ref 0.0–0.2)

## 2024-05-14 LAB — BASIC METABOLIC PANEL WITH GFR
Anion gap: 10 (ref 5–15)
BUN: 21 mg/dL (ref 8–23)
CO2: 22 mmol/L (ref 22–32)
Calcium: 9 mg/dL (ref 8.9–10.3)
Chloride: 105 mmol/L (ref 98–111)
Creatinine, Ser: 1.15 mg/dL (ref 0.61–1.24)
GFR, Estimated: >60 mL/min (ref >60)
Glucose, Bld: 158 mg/dL — ABNORMAL HIGH (ref 70–99)
Potassium: 4 mmol/L (ref 3.5–5.1)
Sodium: 137 mmol/L (ref 135–145)

## 2024-05-14 LAB — GLUCOSE, CAPILLARY
Glucose-Capillary: 143 mg/dL — ABNORMAL HIGH (ref 70–99)
Glucose-Capillary: 165 mg/dL — ABNORMAL HIGH (ref 70–99)
Glucose-Capillary: 164 mg/dL — ABNORMAL HIGH (ref 70–99)

## 2024-05-14 SURGERY — FUSION, TALONAVICULAR JOINT
Anesthesia: General | Site: Foot | Laterality: Right

## 2024-05-14 MED ORDER — MIDAZOLAM HCL 2 MG/2ML IJ SOLN
INTRAMUSCULAR | Status: AC
  Filled 2024-05-14: qty 2

## 2024-05-14 MED ORDER — CHLORHEXIDINE GLUCONATE 0.12 % MT SOLN: 15.0000 mL | Freq: Once | OROMUCOSAL | Status: AC

## 2024-05-14 MED ORDER — LIDOCAINE 2% (20 MG/ML) 5 ML SYRINGE
INTRAMUSCULAR | Status: AC
  Filled 2024-05-14: qty 5

## 2024-05-14 MED ORDER — EPHEDRINE 5 MG/ML INJ
INTRAVENOUS | Status: AC
Start: 2024-05-14 — End: 2024-05-14
  Filled 2024-05-14: qty 5

## 2024-05-14 MED ORDER — PHENYLEPHRINE 80 MCG/ML (10ML) SYRINGE FOR IV PUSH (FOR BLOOD PRESSURE SUPPORT)
PREFILLED_SYRINGE | INTRAVENOUS | Status: DC | PRN
  Administered 2024-05-14: 160 ug via INTRAVENOUS
  Administered 2024-05-14: 80 ug via INTRAVENOUS

## 2024-05-14 MED ORDER — LACTATED RINGERS IV SOLN: INTRAVENOUS | Status: DC

## 2024-05-14 MED ORDER — PHENYLEPHRINE 80 MCG/ML (10ML) SYRINGE FOR IV PUSH (FOR BLOOD PRESSURE SUPPORT)
PREFILLED_SYRINGE | INTRAVENOUS | Status: AC
  Filled 2024-05-14: qty 10

## 2024-05-14 MED ORDER — INSULIN ASPART 100 UNIT/ML IJ SOLN: 0.0000 [IU] | INTRAMUSCULAR | Status: DC | PRN

## 2024-05-14 MED ORDER — PROPOFOL 10 MG/ML IV BOLUS
INTRAVENOUS | Status: AC
Start: 2024-05-14 — End: 2024-05-14
  Filled 2024-05-14: qty 20

## 2024-05-14 MED ORDER — ONDANSETRON HCL 4 MG/2ML IJ SOLN
INTRAMUSCULAR | Status: DC | PRN
Start: 2024-05-14 — End: 2024-05-14
  Administered 2024-05-14: 4 mg via INTRAVENOUS

## 2024-05-14 MED ORDER — PHENYLEPHRINE HCL-NACL 20-0.9 MG/250ML-% IV SOLN
INTRAVENOUS | Status: DC | PRN
  Administered 2024-05-14: 20 ug/min via INTRAVENOUS

## 2024-05-14 MED ORDER — EPHEDRINE SULFATE-NACL 50-0.9 MG/10ML-% IV SOSY
PREFILLED_SYRINGE | INTRAVENOUS | Status: DC | PRN
  Administered 2024-05-14: 10 mg via INTRAVENOUS
  Administered 2024-05-14: 5 mg via INTRAVENOUS

## 2024-05-14 MED ORDER — BUPIVACAINE-EPINEPHRINE (PF) 0.5% -1:200000 IJ SOLN
INTRAMUSCULAR | Status: DC | PRN
  Administered 2024-05-14: 10 mL via PERINEURAL
  Administered 2024-05-14: 30 mL via PERINEURAL

## 2024-05-14 MED ORDER — FENTANYL CITRATE (PF) 100 MCG/2ML IJ SOLN: 25.0000 ug | INTRAMUSCULAR | Status: DC | PRN

## 2024-05-14 MED ORDER — SUCCINYLCHOLINE CHLORIDE 200 MG/10ML IV SOSY
PREFILLED_SYRINGE | INTRAVENOUS | Status: AC
  Filled 2024-05-14: qty 10

## 2024-05-14 MED ORDER — CEFAZOLIN SODIUM-DEXTROSE 2-4 GM/100ML-% IV SOLN
INTRAVENOUS | Status: AC
  Filled 2024-05-14: qty 100

## 2024-05-14 MED ORDER — ACETAMINOPHEN 500 MG PO TABS: 1000.0000 mg | ORAL_TABLET | Freq: Once | ORAL | Status: AC

## 2024-05-14 MED ORDER — ORAL CARE MOUTH RINSE: 15.0000 mL | Freq: Once | OROMUCOSAL | Status: AC

## 2024-05-14 MED ORDER — FENTANYL CITRATE (PF) 100 MCG/2ML IJ SOLN
INTRAMUSCULAR | Status: AC
  Filled 2024-05-14: qty 2

## 2024-05-14 MED ORDER — ROCURONIUM BROMIDE 10 MG/ML (PF) SYRINGE
PREFILLED_SYRINGE | INTRAVENOUS | Status: AC
  Filled 2024-05-14: qty 10

## 2024-05-14 MED ORDER — OXYCODONE-ACETAMINOPHEN 5-325 MG PO TABS: 1.0000 | ORAL_TABLET | ORAL | 0 refills | Status: AC | PRN

## 2024-05-14 MED ORDER — LIDOCAINE 2% (20 MG/ML) 5 ML SYRINGE
INTRAMUSCULAR | Status: DC | PRN
  Administered 2024-05-14: 80 mg via INTRAVENOUS

## 2024-05-14 MED ORDER — CEPHALEXIN 500 MG PO CAPS: 500.0000 mg | ORAL_CAPSULE | Freq: Three times a day (TID) | ORAL | 0 refills | Status: AC

## 2024-05-14 MED ORDER — ONDANSETRON HCL 4 MG/2ML IJ SOLN: 4.0000 mg | Freq: Once | INTRAMUSCULAR | Status: DC | PRN

## 2024-05-14 MED ORDER — ACETAMINOPHEN 500 MG PO TABS
ORAL_TABLET | ORAL | Status: AC
  Administered 2024-05-14: 1000 mg via ORAL
  Filled 2024-05-14: qty 2

## 2024-05-14 MED ORDER — FENTANYL CITRATE (PF) 100 MCG/2ML IJ SOLN: 50.0000 ug | Freq: Once | INTRAMUSCULAR | Status: AC

## 2024-05-14 MED ORDER — PROPOFOL 10 MG/ML IV BOLUS
INTRAVENOUS | Status: DC | PRN
  Administered 2024-05-14: 200 mg via INTRAVENOUS

## 2024-05-14 MED ORDER — CHLORHEXIDINE GLUCONATE 0.12 % MT SOLN
OROMUCOSAL | Status: AC
Start: 2024-05-14 — End: 2024-05-14
  Administered 2024-05-14: 15 mL via OROMUCOSAL
  Filled 2024-05-14: qty 15

## 2024-05-14 MED ORDER — FENTANYL CITRATE (PF) 100 MCG/2ML IJ SOLN
INTRAMUSCULAR | Status: AC
  Administered 2024-05-14: 50 ug via INTRAVENOUS
  Filled 2024-05-14: qty 2

## 2024-05-14 MED ORDER — FENTANYL CITRATE (PF) 250 MCG/5ML IJ SOLN
INTRAMUSCULAR | Status: DC | PRN
  Administered 2024-05-14: 50 ug via INTRAVENOUS

## 2024-05-14 MED ORDER — MIDAZOLAM HCL (PF) 2 MG/2ML IJ SOLN: 2.0000 mg | Freq: Once | INTRAMUSCULAR | Status: AC

## 2024-05-14 MED ORDER — MIDAZOLAM HCL 2 MG/2ML IJ SOLN
INTRAMUSCULAR | Status: AC
  Administered 2024-05-14: 2 mg via INTRAVENOUS
  Filled 2024-05-14: qty 2

## 2024-05-14 MED ORDER — CEFAZOLIN SODIUM-DEXTROSE 2-4 GM/100ML-% IV SOLN
2.0000 g | Freq: Once | INTRAVENOUS | Status: AC
  Administered 2024-05-14: 2 g via INTRAVENOUS

## 2024-05-14 SURGICAL SUPPLY — 65 items
ALLOGRAFT ARTHROCELL PLUS 2.5 (Bone Implant) IMPLANT
ALLOGRAFT ARTHROCELL PLUS 5 (Bone Implant) IMPLANT
BENZOIN TINCTURE PRP APPL 2/3 (GAUZE/BANDAGES/DRESSINGS) IMPLANT
BIT DRILL 3.2 CANN STRGHT (BIT) IMPLANT
BLADE AVERAGE 25X9 (BLADE) IMPLANT
BLADE LONG MED 31X9 (MISCELLANEOUS) IMPLANT
BLADE OSC/SAG .038X5.5 CUT EDG (BLADE) IMPLANT
BLADE OSCILLATING SAW SHORT (MISCELLANEOUS) IMPLANT
BLADE SURG 15 STRL LF DISP TIS (BLADE) ×4 IMPLANT
BLADE SW THK.38XMED LNG THN (BLADE) ×2 IMPLANT
BNDG COMPR ESMARK 4X3 LF (GAUZE/BANDAGES/DRESSINGS) ×2 IMPLANT
BNDG ELASTIC 4INX 5YD STR LF (GAUZE/BANDAGES/DRESSINGS) ×4 IMPLANT
BNDG GAUZE DERMACEA FLUFF 4 (GAUZE/BANDAGES/DRESSINGS) ×2 IMPLANT
BUR BARREL STRAIGHT FLUTE 4.0 (BURR) IMPLANT
CHLORAPREP W/TINT 26 (MISCELLANEOUS) ×2 IMPLANT
COVER BACK TABLE 60X90IN (DRAPES) ×2 IMPLANT
CUFF TOURN SGL QUICK 12 (TOURNIQUET CUFF) IMPLANT
CUFF TOURN SGL QUICK 18X4 (TOURNIQUET CUFF) IMPLANT
DRAPE EXTREMITY T 121X128X90 (DISPOSABLE) ×2 IMPLANT
DRAPE IMP U-DRAPE 54X76 (DRAPES) ×2 IMPLANT
DRAPE OEC MINIVIEW 54X84 (DRAPES) ×2 IMPLANT
DRAPE U-SHAPE 47X51 STRL (DRAPES) ×2 IMPLANT
ELECTRODE REM PT RTRN 9FT ADLT (ELECTROSURGICAL) ×2 IMPLANT
GAUZE 4X4 16PLY ~~LOC~~+RFID DBL (SPONGE) IMPLANT
GAUZE SPONGE 4X4 12PLY STRL (GAUZE/BANDAGES/DRESSINGS) ×2 IMPLANT
GAUZE XEROFORM 1X8 LF (GAUZE/BANDAGES/DRESSINGS) ×2 IMPLANT
GLOVE BIO SURGEON STRL SZ7.5 (GLOVE) ×2 IMPLANT
GLOVE BIOGEL PI IND STRL 7.5 (GLOVE) ×2 IMPLANT
GOWN STRL REUS W/ TWL XL LVL3 (GOWN DISPOSABLE) ×4 IMPLANT
GUIDEWIRE .045XTROC TIP LSR LN (WIRE) IMPLANT
GUIDEWIRE W/TRCR TIP 2.4X9.25 (WIRE) IMPLANT
GUIDEWIRE W/TROCAR TIP .094X8 (WIRE) IMPLANT
KIT TURNOVER KIT B (KITS) ×2 IMPLANT
KWIRE FX6X.062X2 END TROC (WIRE) IMPLANT
MANIFOLD NEPTUNE II (INSTRUMENTS) ×2 IMPLANT
NDL FILTER BLUNT 18X1 1/2 (NEEDLE) ×2 IMPLANT
NDL HYPO 25X1 1.5 SAFETY (NEEDLE) ×6 IMPLANT
NEEDLE FILTER BLUNT 18X1 1/2 (NEEDLE) ×2 IMPLANT
NEEDLE HYPO 25X1 1.5 SAFETY (NEEDLE) ×6 IMPLANT
NS IRRIG 500ML POUR BTL (IV SOLUTION) ×2 IMPLANT
PACK BASIN DAY SURGERY FS (CUSTOM PROCEDURE TRAY) ×2 IMPLANT
PAD CAST 4YDX4 CTTN HI CHSV (CAST SUPPLIES) ×2 IMPLANT
PAD PREP 24X48 CUFFED NSTRL (MISCELLANEOUS) ×2 IMPLANT
PENCIL SMOKE EVACUATOR (MISCELLANEOUS) ×2 IMPLANT
PIN COMPR SNAP-OFF 2.4X32 (Pin) IMPLANT
RASP SM TEAR CROSS CUT (RASP) IMPLANT
SCREW CANN HDLS FT STD 4X52 (Pin) IMPLANT
SCREW CANN THRD SD 6.7X85X18 (Screw) IMPLANT
SCREW CANN TI 18 THRD 6.7X95 (Screw) IMPLANT
SCREW COMPR HEADLESS STD 4X50 (Screw) IMPLANT
SLEEVE SCD COMPRESS KNEE MED (STOCKING) ×2 IMPLANT
STOCKINETTE 4X48 STRL (DRAPES) ×2 IMPLANT
STRIP CLOSURE SKIN 1/4X4 (GAUZE/BANDAGES/DRESSINGS) IMPLANT
SUCTION TUBE FRAZIER 10FR DISP (SUCTIONS) ×2 IMPLANT
SUT MNCRL AB 3-0 PS2 18 (SUTURE) IMPLANT
SUT MNCRL AB 3-0 PS2 27 (SUTURE) ×2 IMPLANT
SUT MNCRL AB 4-0 PS2 18 (SUTURE) ×2 IMPLANT
SUT MON AB 5-0 PS2 18 (SUTURE) ×2 IMPLANT
SUT PROLENE 3 0 PS 2 (SUTURE) IMPLANT
SUT PROLENE 4 0 PS 2 18 (SUTURE) IMPLANT
SYR 10ML LL (SYRINGE) ×4 IMPLANT
SYR BULB EAR ULCER 3OZ GRN STR (SYRINGE) ×2 IMPLANT
TUBE CONNECTING 12X1/4 (SUCTIONS) ×2 IMPLANT
WATER STERILE IRR 500ML POUR (IV SOLUTION) ×2 IMPLANT
WIRE CALIBR MEASUREMENT 1.1 (WIRE) IMPLANT

## 2024-05-14 NOTE — Anesthesia Postprocedure Evaluation (Signed)
 Anesthesia Post Note  Patient: Steven Haas  Procedure(s) Performed: FUSION, TALONAVICULAR JOINT (Right) FUSION, JOINT, FOOT (Right: Foot) PROCEDURE, BONE GRAFT (Right)     Patient location during evaluation: PACU Anesthesia Type: General Level of consciousness: awake and alert Pain management: pain level controlled Vital Signs Assessment: post-procedure vital signs reviewed and stable Respiratory status: spontaneous breathing, nonlabored ventilation and respiratory function stable Cardiovascular status: blood pressure returned to baseline and stable Postop Assessment: no apparent nausea or vomiting Anesthetic complications: no   No notable events documented.  Last Vitals:  Vitals:   05/14/24 1330 05/14/24 1345  BP: 135/73 132/71  Pulse: 60 60  Resp: 15 10  Temp:  37 C  SpO2: 97% 98%    Last Pain:  Vitals:   05/14/24 1345  TempSrc:   PainSc: 0-No pain                 Garnette [name]

## 2024-05-14 NOTE — Discharge Instructions (Signed)
 Foot & Ankle Surgery Patient Discharge Instructions:   Arrange to have an adult drive you home after surgery. If you had general anesthesia, it may take a day or more to fully recover. So, for at least the next 24 hours: Do not drive or use machinery or power tools; do not drink alcohol; and do not make any major decisions.   Bandages: Keep your dressings clean, dry, and intact. Do not remove or change. When bathing/showering, cover the bandage or cast with a plastic bag to keep it dry (still keep the area away from the water) and use a chair, do not stand. Don't remove your bandage until your doctor tells you to. If your bandage gets wet or dirty, check with your doctor. You can likely replace it with a clean, dry one.  Activity: Non weight bearing to right lower extremity with crutches or knee scooter  Sit or lie down when possible. Put a pillow under your heel to raise your foot above the level of your heart. Place ice bag or pack behind knee on surgical side for 20 minutes every hour that you are awake for the first 24-48 hours. You can drive again when instructed by your doctor. Wear your surgical shoe at all times unless told otherwise by your health care provider. Use crutches or a cane as directed. Follow your doctor's instructions about putting weight on your foot.  Diet: Start with liquids and light foods (such as dry toast, bananas, and applesauce). As you feel up to it, slowly return to your normal diet. Drink at least six to eight glasses of water or other nonalcoholic fluids a day. To avoid nausea, eat before taking narcotic pain medications.  Medications: Take all medications as instructed. Take pain medications on time. Do not wait until the pain is bad before taking your medications. Avoid alcohol while on pain medications.  Call your doctor if: Continuous bleeding through dressings. If your dressings get wet. Fevers over 100.4 degrees Fahrenheit (38 degrees  Celsius). Chest pains or difficulty breathing.

## 2024-05-14 NOTE — Progress Notes (Signed)
 Orthopedic Tech Progress Note Patient Details:  Steven Haas Sep 06, 1950 [id number]  Patient stated he would be using a KNEE SCOOTER does not feel safe using crutches at his age.  Patient ID: Steven Haas, male   DOB: 06/08/1951, 73 y.o.   MRN: [id number]  [name] Pac 05/14/2024, 1:45 PM

## 2024-05-14 NOTE — Op Note (Signed)
 Full Operative Report  Date of Operation: 5:36 PM, 05/14/2024   Patient: Steven Haas - 73 y.o. 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  Removal of retained orthopedic hardware deep, right foot 2.  Revision arthrodesis of talonavicular and subtalar joint with bone graft, right foot    Anesthesia: General  No responsible provider has been recorded for the case.  Anesthesiologist: Corinne Garnette FORBES, MD CRNA: Hedy Jarred, CRNA; Claudene Florina Boga, CRNA   Estimated Blood Loss: 200 mL  Hemostasis: 1) Anatomical dissection, mechanical compression, electrocautery 2) attempt was made to use a calf tourniquet inflated to 250 and then 300 mmHg however had residual venous bleeding concern for venous tourniquet and was therefore let down after short amount of time.  Implants: Implant Name Type Inv. Item Serial No. Manufacturer Lot No. LRB No. Used Action  ALLOGRAFT ARTHROCELL PLUS 5 - [id number] Bone Implant ALLOGRAFT ARTHROCELL PLUS 5 [id number] [employer]  Right 1 Implanted  ALLOGRAFT ARTHROCELL PLUS 2.5 - [id number] Bone Implant ALLOGRAFT ARTHROCELL PLUS 2.5 [id number] [employer]  Right 1 Implanted  SCREW CANN THRD SD 3.2K14K81 - [id number] Screw SCREW CANN THRD SD 3.2K14K81  [employer]  Right 1 Implanted  SCREW CANN TI 18 THRD 6.7X95 - [id number] Screw SCREW CANN TI 18 THRD 6.7X95  [employer]  Right 1 Implanted  SCREW COMPR HEADLESS STD 4X50 - [id number] Screw SCREW COMPR HEADLESS STD 4X50  [employer]  Right 1 Implanted    Materials: Monocryl 3-0, Prolene 3-0, skin staples  Injectables: 1) Pre-operatively: Preoperative block given per anesthesia record 2) Post-operatively: None   Specimens: - Pathology: None - Microbiology: None   Antibiotics: 2 g Ancef given  preoperatively  Drains: None  Complications: Patient tolerated the procedure well without complication.   Operative findings: As below in detailed report  Indications for Procedure: Steven Haas presents to Mal[facility] Marsa FALCON, DPM with a chief complaint of chronic pain in the right lateral rear foot after prior subtalar joint and talonavicular joint arthrodesis.  Concern for hardware irritation and pseudoarthrosis of both the talonavicular and the subtalar joints.  The patient has failed conservative treatments of various modalities. At this time the patient has elected to proceed with surgical correction. All alternatives, risks, and complications of the procedures were thoroughly explained to the patient. Patient exhibits appropriate understanding of all discussion points and informed consent was signed and obtained in the chart with no guarantees to surgical outcome given or implied.  Description of Procedure: Patient was brought to the operating room. Patient was positioned on the operating room table in the supine position and was padded at all bony prominences. A surgical timeout was performed and all members of the operating room, the procedure, and the surgical site were identified. anesthesia occurred as per anesthesia record.  As a preoperative nerve block was performed by anesthesia no additional local anesthetic was administered during procedure.  The operative lower extremity as noted above was then prepped and draped in the usual sterile manner. The following procedure then began.  Attention was first directed to the dorsal medial midfoot over the talonavicular joint.  Incision was made with 15 blade over prior surgical incision.  The incision was carried down to bone level with careful dissection.  Hemostasis was achieved with electrocautery as needed.  There was noted to  be significant venous oozing throughout the case.  I attributed this initially to venous tourniquet which was  released and it did slow down slightly however there was still severe oozing likely from chronic anticoagulation the patient is on.  Dissection was carried out around the talonavicular joint with 15 blade on the bone level.  The previously placed compression staples were one by one individually located and then removed with use of a osteotome and mallet.  There was noted to be breakage of several of the staples.  I was able to date out one of the legs that had broken off however 1 leg remained implanted within the talar neck.  It was felt that trying to retrieve this would cause more harm than good and therefore was left in place.  The remaining 2 staples were removed in entirety.  The talonavicular joint was examined and there was noted to be some osseous healing across the talonavicular joint.  There was some fragmentation of the bone as well especially medially.  Attention was then directed to the posterior plantar aspect of the right heel.  Incision was made longitudinally so as to expose the posterior plantar aspect of the calcaneus.  Osteotome as well as fluoroscopy was used to locate the screw heads for the 2 headless compression screws present.  Osteotome and mallet was used to free up the screw heads after them and located with K wire down the shaft of the screws.  There was noted to be some fragmentation of the posterior plantar aspect of the calcaneus following the exposure of the screw heads owing to the fact that there had been significant bone growth overlying the screw heads.  Fortunately both screws were able to be removed in total.  All removed hardware was passed to the back table and cleansed so as to be sent with the patient per his request.  With a 15 blade, a lateral linear sinus tarsi incision was made from the distal tip of the fibula to the base of the fourth metatarsal. Dissection was carried down through subcutaneous tissues.there was noted to be significant scar tissue formation from  prior surgery.. Lateral capsulotomy and ligament release of the subtalar joint was performed and the joint was prepared for arthrodesis. The joint was opened and examined with a Ceola elevator-there was noted to be some evidence of osseous healing at the posterior aspect of the joint however there was also evidence of free space and nonhealing at the middle and anterior facets of the subtalar joint.  Next using a sagittal saw the joint was planed so as to loosen the joint and allow for entry of a curette and power bur.. The joint was then prepped via curette as well as a power bur. The wound was irrigated.  Decision was then made to place Rome cell bone graft in the subtalar joint so as to improve chances of osseous healing.  5 cc of Arth[employer] allograft was then directly implanted within the subtalar joint.. The subtalar joint was reduced and a guidewire for a 6.7 mm headed cannulated screw was placed from anterior to posterior through the talar neck towards the posterior plantar aspect of the calcaneus. this was done so as to allow crossing compression. This was checked on fluoroscopic axial and lateral views. Guidewire was overdrilled and a 6.7 mm headed lag screw was placed across the subtalar joint arthrodesis site. Excellent compression and screw purchase were noted.  Then, a guidewire for a 6.7 mm cannulated headed screw was placed  from the inferior non-weightbearing portion of the calcaneus into the talus. This was checked on fluoroscopic axial and lateral views. Guidewire was overdrilled and a second 6.7 mm lag screw was placed across the subtalar joint arthrodesis site. Guidewires were then removed. Wounds were irrigated. Deep tissue layer was closed with 3-0 Monocryl subcutaneous tissue layer was closed with 3-0 Monocryl. Skin was closed with staples.    Attention was then turned back to the talonavicular joint incision.  Minimal joint prep was felt to be needed as there was seem to be significant  osseous infill across the talonavicular joint at this time.  the wound was irrigated.  Rongeur and osteotome was used to slightly reprepare the dorsal aspect of the talonavicular joint. Bone graft was then implanted including arthrocell graft 2.5 cc about the dorsal aspect of the TMJ.  The TNJ was then reduced by adducting and plantarflexing the forefoot upon the midfoot. Then 2 guidewires for 4.0 mm screws was placed retrograde across the joint from the medial and central navicular into the talar body. Next two 4.0 mm headless compression screws were placed retrograde across the TNJ. Excellent compression and screw purchase was noted. Placement of hardware was confirmed on fluoroscopy and guidewires were pulled.  The wound was irrigated.  Deep tissue layer was closed with 3-0 Monocryl. Subcutaneous tissue was closed with 3-0 Monocryl. Skin was closed with skin staples.  The surgical site was then dressed with Xeroform 4 x 4 ABD pad Kerlix, cast padding and posterior splint was applied. The patient tolerated both the procedure and anesthesia well with vital signs stable throughout. The patient was transferred in good condition and all vital signs stable  from the OR to recovery under the discretion of anesthesia.  Condition: Vital signs stable, neurovascular status unchanged from preoperative   Surgical plan:  Was able to remove all hardware aside from 1 leg of a broken staple in the TMJ.  Do not feel that will cause any issue as the remaining leg is buried in the bone.  Was able to reprep both the TN and the subtalar joint and had additional arthrocell bone graft.  Solid fixation with crossing 6.7 millimeter screws in the subtalar joint and 2X 4.0 millimeter screws in the talonavicular joint.  The navicular did have some fragmentation as seen previously on x-rays though the bone seem to be viable and blood well.  The patient will be nonweightbearing in a posterior splint to the operative limb until further  instructed. The dressing is to remain clean, dry, and intact. Will continue to follow unless noted elsewhere.   Marsa Honour, DPM [facility]

## 2024-05-14 NOTE — Anesthesia Procedure Notes (Signed)
 Anesthesia Regional Block: Adductor canal block   Pre-Anesthetic Checklist: , timeout performed,  Correct Patient, Correct Site, Correct Laterality,  Correct Procedure, Correct Position, site marked,  Risks and benefits discussed,  Surgical consent,  Pre-op evaluation,  At surgeon's request and post-op pain management  Laterality: Right  Prep: chloraprep       Needles:  Injection technique: Single-shot  Needle Type: Echogenic Needle     Needle Length: 9cm  Needle Gauge: 21     Additional Needles:   Procedures:,,,, ultrasound used (permanent image in chart),,    Narrative:  Start time: 05/14/2024 8:25 AM End time: 05/14/2024 8:29 AM Injection made incrementally with aspirations every 5 mL.  Performed by: Personally  Anesthesiologist: Corinne Garnette BRAVO, MD  Additional Notes: No pain on injection. No increased resistance to injection. Injection made in 5cc increments.  Good needle visualization.  Patient tolerated procedure well.

## 2024-05-14 NOTE — Transfer of Care (Signed)
 Immediate Anesthesia Transfer of Care Note  Patient: Steven Haas  Procedure(s) Performed: FUSION, TALONAVICULAR JOINT (Right) FUSION, JOINT, FOOT (Right: Foot) PROCEDURE, BONE GRAFT (Right)  Patient Location: PACU  Anesthesia Type:General  Level of Consciousness: awake, alert , and oriented  Airway & Oxygen Therapy: Patient Spontanous Breathing  Post-op Assessment: Report given to RN and Post -op Vital signs reviewed and stable  Post vital signs: Reviewed and stable  Last Vitals:  Vitals Value Taken Time  BP 123/78 05/14/24 13:00  Temp 37 C 05/14/24 12:46  Pulse 67 05/14/24 13:01  Resp 15 05/14/24 13:01  SpO2 96 % 05/14/24 13:01  Vitals shown include unfiled device data.  Last Pain:  Vitals:   05/14/24 1246  TempSrc:   PainSc: 0-No pain         Complications: No notable events documented.

## 2024-05-14 NOTE — Anesthesia Procedure Notes (Signed)
 Anesthesia Regional Block: Popliteal block   Pre-Anesthetic Checklist: , timeout performed,  Correct Patient, Correct Site, Correct Laterality,  Correct Procedure, Correct Position, site marked,  Risks and benefits discussed,  Surgical consent,  Pre-op evaluation,  At surgeon's request and post-op pain management  Laterality: Right  Prep: chloraprep       Needles:  Injection technique: Single-shot  Needle Type: Echogenic Needle     Needle Length: 9cm  Needle Gauge: 21     Additional Needles:   Procedures:,,,, ultrasound used (permanent image in chart),,    Narrative:  Start time: 05/14/2024 8:15 AM End time: 05/14/2024 8:25 AM Injection made incrementally with aspirations every 5 mL.  Performed by: Personally  Anesthesiologist: Corinne Garnette BRAVO, MD  Additional Notes: No pain on injection. No increased resistance to injection. Injection made in 5cc increments.  Good needle visualization.  Patient tolerated procedure well.

## 2024-05-14 NOTE — Anesthesia Procedure Notes (Signed)
 Procedure Name: LMA Insertion Date/Time: 05/14/2024 9:53 AM  Performed by: Hedy Jarred, CRNAPre-anesthesia Checklist: Patient identified, Emergency Drugs available, Suction available and Patient being monitored Patient Re-evaluated:Patient Re-evaluated prior to induction Oxygen Delivery Method: Circle System Utilized Preoxygenation: Pre-oxygenation with 100% oxygen Induction Type: IV induction Ventilation: Mask ventilation without difficulty LMA: LMA inserted LMA Size: 5.0 Number of attempts: 1 Airway Equipment and Method: Bite block Placement Confirmation: positive ETCO2 Tube secured with: Tape Dental Injury: Teeth and Oropharynx as per pre-operative assessment

## 2024-05-14 NOTE — Interval H&P Note (Signed)
 History and Physical Interval Note:  05/14/2024 7:10 AM  [name]  has presented today for surgery, with the diagnosis of PSEUDARTHROSIS AFTER JOINT FUSION.  The various methods of treatment have been discussed with the patient and family. After consideration of risks, benefits and other options for treatment, the patient has consented to  Procedure(s) with comments: FUSION, TALONAVICULAR JOINT (Right) - GENERAL WITH POP BLOCK FUSION, JOINT, FOOT (Right) - SUBTALAR ARTHRODESIS PROCEDURE, BONE GRAFT (Right) as a surgical intervention.  The patient's history has been reviewed, patient examined, no change in status, stable for surgery.  I have reviewed the patient's chart and labs.  Questions were answered to the patient's satisfaction.     Marsa FALCON Brenda Samano

## 2024-05-15 ENCOUNTER — Encounter (HOSPITAL_COMMUNITY): Payer: Self-pay | Admitting: Podiatry

## 2024-05-19 ENCOUNTER — Encounter (HOSPITAL_COMMUNITY): Payer: Self-pay | Admitting: Podiatry

## 2024-05-22 ENCOUNTER — Ambulatory Visit (INDEPENDENT_AMBULATORY_CARE_PROVIDER_SITE_OTHER): Admitting: Podiatry

## 2024-05-22 DIAGNOSIS — M960 Pseudarthrosis after fusion or arthrodesis: Secondary | ICD-10-CM

## 2024-05-22 DIAGNOSIS — Z9889 Other specified postprocedural states: Secondary | ICD-10-CM

## 2024-05-22 NOTE — Progress Notes (Signed)
  Subjective:  Patient ID: Steven Haas, male    DOB: [birth year],  MRN: [id number]  No chief complaint on file.   DOS: 05/14/2024 Procedure: 1.  Removal of retained orthopedic hardware deep, right foot 2.  Revision arthrodesis of talonavicular and subtalar joint with bone graft, right foot  73 y.o. male seen for post op check.  Patient is approxi-1 week status post above procedures.  Has been nonweightbearing in a posterior splint.  Dressings remain clean dry and intact since surgery.  Pain is controlled and better than it was after the initial surgery  Review of Systems: Negative except as noted in the HPI. Denies N/V/F/Ch.   Objective:   Constitutional Well developed. Well nourished.  Vascular Foot warm and well perfused. Capillary refill normal to all digits.   No calf pain with palpation  Neurologic Normal speech. Oriented to person, place, and time. Epicritic sensation intact to right foot  Dermatologic Incision sites well coapted no drainage or dehiscence        Orthopedic: Status post revision TN and STJ arthrodesis, minimal edema noted today   Radiographs: Deferred today will consider next appointment  05/14/24: 1. Postoperative changes with orthopedic screws fixating the subtalar and talonavicular joints. 2. Sclerosis, fragmentation, and surrounding soft tissue calcifications in the region of the navicular bone, unchanged.  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 Post-operative state     Plan:  Patient was evaluated and treated and all questions answered.  POD # 7 s/p right foot revision STJ and TN J arthrodesis  -Progressing as expected postoperatively -XR: Deferred today, postoperative x-rays demonstrate good placement of hardware with well opposed STJ and TMJ -WB Status: Nonweightbearing in cam boot -patient has cam boot from prior surgery -Sutures: Remain intact. -Medications/ABX: Status post course of  postoperative antibiotics no further antibiotics indicated -Dressing: Applied a new Xeroform 4 x 4 Kerlix dressing leave intact until next appointment - F/u Plan: Follow-up in 1 week        Steven Haas, DPM [facility] / [facility]

## 2024-05-29 ENCOUNTER — Ambulatory Visit (INDEPENDENT_AMBULATORY_CARE_PROVIDER_SITE_OTHER)

## 2024-05-29 ENCOUNTER — Encounter: Payer: Self-pay | Admitting: Podiatry

## 2024-05-29 ENCOUNTER — Ambulatory Visit (INDEPENDENT_AMBULATORY_CARE_PROVIDER_SITE_OTHER): Admitting: Podiatry

## 2024-05-29 DIAGNOSIS — Z9889 Other specified postprocedural states: Secondary | ICD-10-CM | POA: Diagnosis not present

## 2024-05-29 DIAGNOSIS — M960 Pseudarthrosis after fusion or arthrodesis: Secondary | ICD-10-CM

## 2024-05-29 NOTE — Progress Notes (Signed)
  Subjective:  Patient ID: NAS WAFER, male    DOB: 1951-02-10,  MRN: [id number]  No chief complaint on file.   DOS: 05/14/2024 Procedure: 1.  Removal of retained orthopedic hardware deep, right foot 2.  Revision arthrodesis of talonavicular and subtalar joint with bone graft, right foot  73 y.o. male seen for post op check.  Patient is 2 weeks status post above procedures.  Has been nonweightbearing in a cam boot.  Has left dressings on since last appointment.  Reports minimal pain reports minimal swelling doing well no concerns.  Review of Systems: Negative except as noted in the HPI. Denies N/V/F/Ch.   Objective:   Constitutional Well developed. Well nourished.  Vascular Foot warm and well perfused. Capillary refill normal to all digits.   No calf pain with palpation  Neurologic Normal speech. Oriented to person, place, and time. Epicritic sensation intact to right foot  Dermatologic Incision sites well coapted no drainage or dehiscence Healing improved from prior no evidence of infection or dehiscence at any of the sites   Orthopedic: Status post revision TN and STJ arthrodesis, minimal edema noted today   Radiographs: Deferred today will consider next appointment  05/14/24: 1. Postoperative changes with orthopedic screws fixating the subtalar and talonavicular joints. 2. Sclerosis, fragmentation, and surrounding soft tissue calcifications in the region of the navicular bone, unchanged.  05/29/2024 XR 3 views AP lateral oblique of the right foot: Subtalar joint and talonavicular joint are with good apposition no evidence of hardware failure or loosening.  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 Post-operative state     Plan:  Patient was evaluated and treated and all questions answered.  2 weeks s/p right foot revision STJ and TN J  arthrodesis  -Progressing as expected postoperatively, improved healing noted on radiographs taken today -XR: Appropriate position and good apposition of STJ and TNJ -WB Status: Nonweightbearing in cam boot   -Sutures: Staples and sutures removed in total today -Medications/ABX: Status post course of postoperative antibiotics no further antibiotics indicated -Dressing: Applied a new gauze dressing he is okay to now wash the right foot with warm soapy water and then apply new gauze dressing for another week after which she does not need to put any dressing on.  - F/u Plan: Follow-up in 4 week        [name], DPM [facility] / [facility]

## 2024-06-26 ENCOUNTER — Ambulatory Visit (INDEPENDENT_AMBULATORY_CARE_PROVIDER_SITE_OTHER): Admitting: Podiatry

## 2024-06-26 ENCOUNTER — Encounter: Payer: Self-pay | Admitting: Podiatry

## 2024-06-26 ENCOUNTER — Ambulatory Visit (INDEPENDENT_AMBULATORY_CARE_PROVIDER_SITE_OTHER)

## 2024-06-26 DIAGNOSIS — Z9889 Other specified postprocedural states: Secondary | ICD-10-CM

## 2024-06-26 DIAGNOSIS — M960 Pseudarthrosis after fusion or arthrodesis: Secondary | ICD-10-CM

## 2024-06-26 NOTE — Progress Notes (Signed)
°  Subjective:  Patient ID: Steven Haas, male    DOB: 1950-09-21,  MRN: [id number]  Chief Complaint  Patient presents with   Routine Post Op    RT FOOT REVISION ARTHRODESIS OF TALON NAVICULAR AND SUBTALAR JOINT W/ BONE GRAFTING AND BONE MARROW ASPIRATION. 3 pain. Non weight bearing. Knee scooter. NIDDM 7.8.     DOS: 05/14/2024 Procedure: 1.  Removal of retained orthopedic hardware deep, right foot 2.  Revision arthrodesis of talonavicular and subtalar joint with bone graft, right foot  73 y.o. male seen for post op check.  Patient is 6 weeks status post above procedures.  Has been nonweightbearing in a cam boot with aid of a knee scooter.  He reports he is doing well he does have some tenderness with palpation on the TN joint.  He does report he is able to dorsiflex and plantarflex his ankle and it feels a little tight but does not have pain  Review of Systems: Negative except as noted in the HPI. Denies N/V/F/Ch.   Objective:   Constitutional Well developed. Well nourished.  Vascular Foot warm and well perfused. Capillary refill normal to all digits.   No calf pain with palpation  Neurologic Normal speech. Oriented to person, place, and time. Epicritic sensation intact to right foot  Dermatologic Incision sites fully healed no evidence of infection   Orthopedic: Status post revision TN and STJ arthrodesis, minimal edema noted today.  On palpation of the TMJ there is some focal pain on palpation at the area   Radiographs: Deferred today will consider next appointment  05/14/24: 1. Postoperative changes with orthopedic screws fixating the subtalar and talonavicular joints. 2. Sclerosis, fragmentation, and surrounding soft tissue calcifications in the region of the navicular bone, unchanged.  05/29/2024 XR 3 views AP lateral oblique of the right foot: Subtalar joint and talonavicular joint are with good apposition no evidence of hardware failure or loosening.  Appropriate  position maintained no evidence of nonunion or complication at this time  06/26/2024 XR 3 views AP lateral oblique of the right foot: Appears to be increased osseous infill at the TN and subtalar joint especially improved from prior no evidence of hardware loosening or failure.  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 Post-operative state     Plan:  Patient was evaluated and treated and all questions answered.  6 weeks s/p right foot revision STJ and TN J arthrodesis  -Progressing as expected postoperatively, improved healing noted on radiographs taken today -XR: Appropriate position and good apposition of STJ and TNJ -WB Status: Nonweightbearing in cam boot until January 1 and then he can begin putting some weight down and walk in the boot until her next appointment -Sutures: Previously removed -Medications/ABX: None indicated -Dressing: No wound care needed - F/u Plan: Follow-up in 4 weeks        [name], DPM [facility] / [facility]

## 2024-07-24 ENCOUNTER — Ambulatory Visit (INDEPENDENT_AMBULATORY_CARE_PROVIDER_SITE_OTHER): Admitting: Podiatry

## 2024-07-24 ENCOUNTER — Encounter: Payer: Self-pay | Admitting: Podiatry

## 2024-07-24 DIAGNOSIS — M960 Pseudarthrosis after fusion or arthrodesis: Secondary | ICD-10-CM

## 2024-07-24 DIAGNOSIS — Z9889 Other specified postprocedural states: Secondary | ICD-10-CM

## 2024-07-24 NOTE — Progress Notes (Signed)
 "  Subjective:  Patient ID: Steven Haas, male    DOB: 1950/10/05,  MRN: [id number]  Chief Complaint  Patient presents with   Routine Post Op     RT FOOT REVISION ARTHRODESIS OF TALON NAVICULAR AND SUBTALAR JOINT W/ BONE GRAFTING AND BONE MARROW ASPIRATION.  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 Wearing pneumatic cast. 3 stiffness pain with walking.     DOS: 05/14/2024 Procedure: 1.  Removal of retained orthopedic hardware deep, right foot 2.  Revision arthrodesis of talonavicular and subtalar joint with bone graft, right foot  74 y.o. male seen for post op check.  Patient is 10 weeks status post above procedures.  Has begun walking in his cam boot he reports he is doing well with that does not have much pain.  He says it is continuing to decrease.  He does note some pain in the lower leg from the boot irritating him and asked if we can go to a shoe.  Also has a small spot on the forefoot that is irritation from the metatarsal head  Review of Systems: Negative except as noted in the HPI. Denies N/V/F/Ch.   Objective:   Constitutional Well developed. Well nourished.  Vascular Foot warm and well perfused. Capillary refill normal to all digits.   No calf pain with palpation  Neurologic Normal speech. Oriented to person, place, and time. Epicritic sensation intact to right foot  Dermatologic Incision sites fully healed no evidence of infection   Orthopedic: Status post revision TN and STJ arthrodesis, minimal edema noted today.  On palpation of the TMJ there is decreased focal pain on palpation at the area  Focal area of irritation under the second metatarsal head prominence likely from walking in the boot.   Radiographs: Deferred today will consider next appointment  05/14/24: 1. Postoperative changes with orthopedic screws fixating the subtalar and talonavicular joints. 2. Sclerosis, fragmentation, and surrounding soft tissue calcifications in the region of the navicular bone,  unchanged.  05/29/2024 XR 3 views AP lateral oblique of the right foot: Subtalar joint and talonavicular joint are with good apposition no evidence of hardware failure or loosening.  Appropriate position maintained no evidence of nonunion or complication at this time  06/26/2024 XR 3 views AP lateral oblique of the right foot: Appears to be increased osseous infill at the TN and subtalar joint especially improved from prior no evidence of hardware loosening or failure.  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 Post-operative state      Plan:  Patient was evaluated and treated and all questions answered.  10 weeks s/p right foot revision STJ and TN J arthrodesis  -Progressing as expected postoperatively, pain continues to decrease postoperatively.  He was having some burning pain likely neuropathy pain that is pretty much gone away. -In regards to the surgical sites the lateral rear foot is pain-free there is some pain at the talonavicular joint with palpation felt much improved from prior. -Using offloading circular donut pad over the area of pressure at the second metatarsal head that is causing irritation. -Allow him to transition from cam boot to postop shoe as the cam boot was giving some irritation in the lower leg.  If he has any pain with postop shoe go back to the boot. -XR: Appropriate position and good apposition of STJ and TNJ -WB Status: Weightbearing in the postoperative shoe or boot until next appointment -Sutures: Previously removed -Medications/ABX: None indicated -Dressing: No wound care  needed - F/u Plan: Follow-up in 4 weeks for additional x-rays        Steven Haas, DPM [facility] / [facility] "

## 2024-08-04 ENCOUNTER — Encounter: Payer: Self-pay | Admitting: Cardiology

## 2024-08-04 ENCOUNTER — Ambulatory Visit: Attending: Cardiology | Admitting: Cardiology

## 2024-08-04 VITALS — BP 140/80 | HR 64 | Ht 76.0 in | Wt 199.0 lb

## 2024-08-04 DIAGNOSIS — I25119 Atherosclerotic heart disease of native coronary artery with unspecified angina pectoris: Secondary | ICD-10-CM | POA: Diagnosis present

## 2024-08-04 DIAGNOSIS — I10 Essential (primary) hypertension: Secondary | ICD-10-CM | POA: Diagnosis not present

## 2024-08-04 DIAGNOSIS — E782 Mixed hyperlipidemia: Secondary | ICD-10-CM | POA: Diagnosis present

## 2024-08-04 NOTE — Progress Notes (Signed)
"  ° ° °  Cardiology Office Note  Date: 08/04/2024   ID: Steven Haas, DOB October 12, 1950, MRN [id number]  History of Present Illness: Steven Haas is a 74 y.o. male last seen in July 2025.  He is here for a follow-up visit.  Reports no angina or interval nitroglycerin  use, stable NYHA class II dyspnea.  Still has a protective boot on his right foot following surgery.  No palpitations or syncope.  We went over his medications.  He reports compliance with cardiac regimen, continue to track blood pressure at home reporting systolics in the 130s.  No spontaneous bleeding problems on aspirin  and Plavix .  He had a lipid panel in July 2025 at which point his LDL was 68.  Physical Exam: VS:  BP (!) 140/80 (BP Location: Right Arm)   Pulse 64   Ht 6' 4 (1.93 m)   Wt 199 lb (90.3 kg)   SpO2 100%   BMI 24.22 kg/m , BMI Body mass index is 24.22 kg/m.  Wt Readings from Last 3 Encounters:  08/04/24 199 lb (90.3 kg)  05/14/24 200 lb (90.7 kg)  02/14/24 193 lb 3.2 oz (87.6 kg)    General: Patient appears comfortable at rest. HEENT: Conjunctiva and lids normal. Neck: Supple, no elevated JVP or carotid bruits. Lungs: Clear to auscultation, nonlabored breathing at rest. Cardiac: Regular rate and rhythm, no S3 or significant systolic murmur.  ECG:  An ECG dated 01/25/2024 was personally reviewed today and demonstrated:  Sinus rhythm with old anterolateral infarct pattern, repolarization abnormalities and PVC.  Labwork: July 2025: Cholesterol 131, triglycerides 96, HDL 45, LDL 68, TSH 1.01 April 2024: Hemoglobin A1c 7.4% 05/14/2024: BUN 21; Creatinine, Ser 1.15; Hemoglobin 14.4; Platelets 190; Potassium 4.0; Sodium 137   Other Studies Reviewed Today:  No interval cardiac testing for review today.  Assessment and Plan:  1.  CAD status post remote anterior infarct in 2004 with angioplasty of the LAD, DES to the mid LAD for restenosis within 2011, Cutting Balloon angioplasty due to LAD in-stent  restenosis in 2013, and DES to the ostial circumflex in 2017.  Cardiac catheterization from July 2023 showed patent LAD and circumflex stent sites with mild to moderate residual disease best managed medically.  Echocardiogram from 2022 revealed LVEF 50 to 55% with wall motion abnormalities consistent with ischemic heart disease.  He remains clinically stable without increasing angina on medical therapy.  Continue aspirin  81 mg daily, Plavix  75 mg daily, Farxiga  10 mg daily, Imdur  30 mg daily, and Pravachol 80 mg daily.  He has as needed nitroglycerin  available.   2.  Mixed hyperlipidemia.  LDL 68 in July 2025.  Continue Pravachol 80 mg daily.   4.  Primary hypertension.  Continue to track measurements at home.  Currently on Altace  10 mg twice daily.  Disposition:  Follow up 6 months.  Signed, Jayson JUDITHANN Sierras, M.D., F.A.C.C. [facility]

## 2024-08-04 NOTE — Patient Instructions (Signed)
 Medication Instructions:   Your physician recommends that you continue on your current medications as directed. Please refer to the Current Medication list given to you today.   Labwork: None today  Testing/Procedures: None today  Follow-Up: 6 months Dr.McDowell  Any Other Special Instructions Will Be Listed Below (If Applicable).  If you need a refill on your cardiac medications before your next appointment, please call your pharmacy.

## 2024-08-21 ENCOUNTER — Ambulatory Visit: Admitting: Podiatry

## 2024-08-21 ENCOUNTER — Encounter: Payer: Self-pay | Admitting: Podiatry

## 2024-08-21 ENCOUNTER — Ambulatory Visit

## 2024-08-21 DIAGNOSIS — M960 Pseudarthrosis after fusion or arthrodesis: Secondary | ICD-10-CM

## 2024-08-21 DIAGNOSIS — Z9889 Other specified postprocedural states: Secondary | ICD-10-CM

## 2024-08-21 NOTE — Progress Notes (Signed)
 "  Subjective:  Patient ID: Steven Haas, male    DOB: Dec 08, 1950,  MRN: [id number]  Chief Complaint  Patient presents with   Routine Post Op    f/u s/p right foot revision STJ and TN J arthrodesis. 2 pain mostly dorsal from shoe.      DOS: 05/14/2024 Procedure: 1.  Removal of retained orthopedic hardware deep, right foot 2.  Revision arthrodesis of talonavicular and subtalar joint with bone graft, right foot  74 y.o. male seen for post op check.  Patient is 14 weeks status post above procedures.   He is doing well walking in post op shoe.  Says he is feeling better hoping to get back to regular shoes and back to work  Review of Systems: Negative except as noted in the HPI. Denies N/V/F/Ch.   Objective:   Constitutional Well developed. Well nourished.  Vascular Foot warm and well perfused. Capillary refill normal to all digits.   No calf pain with palpation  Neurologic Normal speech. Oriented to person, place, and time. Epicritic sensation intact to right foot  Dermatologic Incision sites fully healed no evidence of infection   Orthopedic: Status post revision TN and STJ arthrodesis, minimal edema noted today.  On palpation of the TMJ there is decreased focal pain on palpation at the area, mild edema and pain along Extensor tendons on right foot    Radiographs: Deferred today will consider next appointment  05/14/24: 1. Postoperative changes with orthopedic screws fixating the subtalar and talonavicular joints. 2. Sclerosis, fragmentation, and surrounding soft tissue calcifications in the region of the navicular bone, unchanged.  05/29/2024 XR 3 views AP lateral oblique of the right foot: Subtalar joint and talonavicular joint are with good apposition no evidence of hardware failure or loosening.  Appropriate position maintained no evidence of nonunion or complication at this time  06/26/2024 XR 3 views AP lateral oblique of the right foot: Appears to be increased osseous  infill at the TN and subtalar joint especially improved from prior no evidence of hardware loosening or failure.  08/21/2024 XR 3 views AP lateral oblique of the right foot: Increased osseous infill of the TN and subtalar joint near complete osseous and filled along both these joints no evidence of nonunion or delayed union.  No evidence of hardware loosening or failure.  There are some dorsal osseous spurring seen on the lateral view above the TMJ.  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 Post-operative state      Plan:  Patient was evaluated and treated and all questions answered.  14 weeks s/p right foot revision STJ and TN J arthrodesis  -Progressing as expected postoperatively, pain continues to decrease postoperatively.  -Patient reports some edema over the TMJ possibly due to irritation from bone spurring which was seen on x-ray today. - I recommended a steroid injection with 1 cc half percent Marcaine  plain and 1 cc triamcinolone.  Patient tolerated well. -He is now allowed to transition out of the postop shoe into regular shoe gear recommend a sturdy work boot if going to work -XR: Appropriate position and good apposition of STJ and TNJ, updated x-rays today show near complete healing of the TMJ and the subtalar joint.  No evidence of hardware failure or backing out.  There is some dorsal osseous spurring noted above the TN joint -WB Status: Weightbearing in regular shoe gear at this point -Sutures: Previously removed -Medications/ABX: None indicated -Dressing: No wound  care needed - F/u Plan: Follow-up in 2 months for final recheck        [name], DPM [facility] / [facility] "

## 2024-10-23 ENCOUNTER — Ambulatory Visit: Admitting: Podiatry
